# Patient Record
Sex: Female | Born: 1945 | Race: White | Hispanic: No | Smoking: Former smoker
Health system: Southern US, Community
[De-identification: ages and names within clinical notes are randomized; demographics above are authoritative.]

## PROBLEM LIST (undated history)

## (undated) DIAGNOSIS — G309 Alzheimer's disease, unspecified: Secondary | ICD-10-CM

## (undated) DIAGNOSIS — F0280 Dementia in other diseases classified elsewhere without behavioral disturbance: Secondary | ICD-10-CM

## (undated) DIAGNOSIS — E119 Type 2 diabetes mellitus without complications: Secondary | ICD-10-CM

---

## 2003-11-11 ENCOUNTER — Observation Stay (HOSPITAL_COMMUNITY): Admission: EM | Admit: 2003-11-11 | Discharge: 2003-11-14 | Payer: Self-pay | Admitting: Emergency Medicine

## 2004-01-24 ENCOUNTER — Ambulatory Visit (HOSPITAL_COMMUNITY): Admission: RE | Admit: 2004-01-24 | Discharge: 2004-01-24 | Payer: Self-pay | Admitting: Internal Medicine

## 2008-10-18 ENCOUNTER — Encounter: Payer: Self-pay | Admitting: Emergency Medicine

## 2008-10-18 ENCOUNTER — Inpatient Hospital Stay (HOSPITAL_COMMUNITY): Admission: EM | Admit: 2008-10-18 | Discharge: 2008-10-20 | Payer: Self-pay | Admitting: Cardiology

## 2008-10-19 ENCOUNTER — Encounter (INDEPENDENT_AMBULATORY_CARE_PROVIDER_SITE_OTHER): Payer: Self-pay | Admitting: Cardiology

## 2009-04-24 ENCOUNTER — Encounter: Payer: Self-pay | Admitting: Internal Medicine

## 2009-04-30 ENCOUNTER — Telehealth (INDEPENDENT_AMBULATORY_CARE_PROVIDER_SITE_OTHER): Payer: Self-pay

## 2009-05-02 ENCOUNTER — Telehealth (INDEPENDENT_AMBULATORY_CARE_PROVIDER_SITE_OTHER): Payer: Self-pay | Admitting: *Deleted

## 2009-05-02 ENCOUNTER — Encounter: Payer: Self-pay | Admitting: Gastroenterology

## 2010-03-11 ENCOUNTER — Encounter (INDEPENDENT_AMBULATORY_CARE_PROVIDER_SITE_OTHER): Payer: Self-pay

## 2010-03-11 ENCOUNTER — Ambulatory Visit: Payer: Self-pay | Admitting: Internal Medicine

## 2010-03-26 ENCOUNTER — Emergency Department (HOSPITAL_COMMUNITY): Admission: EM | Admit: 2010-03-26 | Discharge: 2010-03-26 | Payer: Self-pay | Admitting: Emergency Medicine

## 2010-03-26 DIAGNOSIS — Z8719 Personal history of other diseases of the digestive system: Secondary | ICD-10-CM

## 2010-04-28 ENCOUNTER — Ambulatory Visit (HOSPITAL_COMMUNITY): Admission: RE | Admit: 2010-04-28 | Discharge: 2010-04-28 | Payer: Self-pay | Admitting: Internal Medicine

## 2010-05-07 ENCOUNTER — Encounter: Payer: Self-pay | Admitting: Internal Medicine

## 2010-07-22 NOTE — Assessment & Plan Note (Signed)
Summary: CHANGE IN BOWEL HABITS/LAW   Visit Type:  Initial Consult Primary Care Provider:  bulter  Chief Complaint:  change in bowel habits.  History of Present Illness: 65 year old lady referred courtesy of Dr. Charm Barges to further evaluate recent left-sided abdominal pain and changes consistent with diverticulitis on CT. Ms. Westerlund tells me she started having some left-sided abdominal pain about a month ago. A CT demonstrated mild changes consistent with uncomplicated diverticulitis. Also a nonspecific adrenal nodule for which MRI was recommended. The patient has put off getting an MRI to date. Also, she was given a prescription for Cipro but she declined to take it, concerned about side effects. No history of allergies to quinolones.  Her abdominal pain has resolved. She denies any bowel dysfunction. Specifically, she denies constipation, diarrhea, hematochezia or melena. No abdominal pain now,  no fever or chills. No nausea vomiting or reflux symptoms.  History of a colonic adenoma and left sided diverticulosis on 2005 colonoscopy. It was recommended she come back in 2010 a repeat colonoscopy which she has not done as of yet.  Preventive Screening-Counseling & Management  Alcohol-Tobacco     Smoking Status: quit  Current Medications (verified): 1)  Metformin Hcl 500 Mg Tabs (Metformin Hcl) .... Two Times A Day 2)  Lisinopril 20 Mg Tabs (Lisinopril) .... Once Daily 3)  Lasix 40 Mg Tabs (Furosemide) .... Once Daily 4)  Zoloft 100 Mg Tabs (Sertraline Hcl) .... Once Daily 5)  Vitamin D 50000iu .... Twice A Week 6)  Systene Eye Gtt 7)  Vicodin 5-500 Mg Tabs (Hydrocodone-Acetaminophen) .... As Needed 8)  Isosorbide Dinitrate 30 Mg Tabs (Isosorbide Dinitrate) .... Once Daily  Allergies (verified): 1)  ! Cortisone  Past History:  Past Medical History: DM lumbar spinal stenosis abd pain hx of diverticulitis anxiety htn  Past Surgical History: hyst tcs/egd 2005  Family  History: Father: deceased- dm Mother: dm Siblings: 6 brothers, 2 sisters No FH of Colon Cancer:  Social History: Marital Status: widow Children: 3 Occupation: no Patient is a former smoker.  Alcohol Use - no Smoking Status:  quit  Vital Signs:  Patient profile:   65 year old female Height:      61 inches Weight:      224 pounds BMI:     42.48 Temp:     98.9 degrees F oral Pulse rate:   60 / minute BP sitting:   122 / 78  (left arm) Cuff size:   large  Vitals Entered By: Hendricks Limes LPN (March 11, 2010 3:01 PM)  Physical Exam  General:  pleasant 65 year old lady accompanied by her daughter-in-law Eyes:  no scleral icterus. Conjunctivae were pink. Lungs:  clear to auscultation Heart:  regular rate rhythm without murmur gallop rub  Impression & Recommendations: Impression: Pleasant 65 year old lady with recent bout of left-sided abdominal pain most likely related to a bout of uncomplicated diverticulitis. This illness has resolved without any apparent antibiotic therapy. History of colonic adenoma; she is overdue for surveillance colonoscopy.  History of a nonspecific adrenal lesion MRI recommended -  not yet done.  Recommendations: Surveillance colonoscopy in October of this year. Risks, benefits, limitations, imponderables and alternatives have been reviewed; her questions answered. She is agreeable. As a separate issue, I strongly urged her to follow the recommendations of  Dr. Charm Barges g regarding an MRI of the adrenal lesion as previously recommended.  Further recommendations to follow.  Appended Document: Orders Update    Clinical Lists Changes  Problems: Added new problem  of DIVERTICULITIS, HX OF (ICD-V12.79) Orders: Added new Service order of New Patient Level IV (52841) - Signed

## 2010-07-22 NOTE — Letter (Signed)
Summary: Patient Notice, Colon Biopsy Results  [facility]  [address]   [place], [place] [address]   Phone: [phone]  Fax: [phone]       May 07, 2010   [facility] [address] [place], [place]  [address] 1946-06-09    Dear Ms. Korb,  I am pleased to inform you that the biopsies taken during your recent colonoscopy did not show any evidence of cancer upon pathologic examination.  Additional information/recommendations:  You should have a repeat colonoscopy examination  in 5 years.  Please call us if you are having persistent problems or have questions about your condition that have not been fully answered at this time.  Sincerely,    R. Roetta Sessions MD, FACP [facility] Ph: [phone]    Fax: [phone]   Appended Document: Patient Notice, Colon Biopsy Results Letter mailed to pt.  Appended Document: Patient Notice, Colon Biopsy Results reminder in computer

## 2010-07-22 NOTE — Miscellaneous (Signed)
Summary: tcs/egd 2005  Clinical Lists Changes  NAME:  Penny Carr, Penny Carr                       ACCOUNT NO.:  [id number]   MEDICAL RECORD NO.:  [id number]                   PATIENT TYPE:  AMB   LOCATION:  DAY                                  FACILITY:  [facility]   PHYSICIAN:  R. Roetta Sessions, M.D.              DATE OF BIRTH:  12/11/1945   DATE OF PROCEDURE:  01/24/2004  DATE OF DISCHARGE:                                 OPERATIVE REPORT   PROCEDURE:  Diagnostic esophagogastroduodenoscopy by colonoscopy with  biopsy, stool sampling.   INDICATIONS FOR PROCEDURE:  Patient is a 65 year old lady with atypical  chest pain.  Cardiac evaluation came up negative.  She has also got chronic  diarrhea.  She comes for EGD and colonoscopy to further evaluate her chest  pain and to further evaluate her diarrhea and in part for colorectal cancer  screening.  This approach has been discussed with the patient at length.  Potential risks, benefits and alternatives have been reviewed, questions  answered.  Please see my January 11, 2004, consultation note for more  information.   PROCEDURE NOTE:  The O2 saturation, blood pressure, pulse, respirations were  monitored throughout the entirety of both procedures.   CONSCIOUS SEDATION:  1. Versed 4 mg IV.  2. Demerol 100 mg IV.   SBE prophylaxis in the way of ampicillin 2 gm IV, gentamicin 120 mg IV.  Prior to the procedure, Cetacaine spray for topical oropharyngeal  anesthesia.   INSTRUMENT:  Olympus video chip system.   FINDINGS ON EXAMINATION:  Tubular esophagus revealed no mucosal  abnormalities.  EG junction easily traversed.  The remaining stomach, colon  and gastric cavity was emptied and __________.  Thorough examination of  gastric mucosa including retroflexed view of the proximal stomach,  esophagogastric junction demonstrated no abnormalities.  The pylorus was  patent and easily traversed.  Examination of bulb and second portion  revealed no  abnormalities.   THERAPEUTIC/DIAGNOSTIC MANEUVERS PERFORMED:  None.   The patient tolerated the procedure well and was prepared for colonoscopy.  Digital rectal exam revealed no abnormalities.   ENDOSCOPIC FINDINGS:  The prep was adequate.   RECTUM:  Examination of rectal mucosa including retroflexed view of the anal  verge revealed no abnormalities.   COLON:  Colonic mucosa was surveyed from the rectosigmoid junction through  the left transverse, right colon to the appendiceal orifice, ileocecal valve  and cecum.  These structures were well seen and photographed for the record.  The terminal ileum was intubated as well.  From this level, the scope was  slowly withdrawn.  All previously mentioned mucosal surfaces were again  seen.  The patient was noted to have sigmoid diverticula and a 3 mm polyp in  the mid descending colon which was cold biopsied.  The terminal ileum  appeared normal.  A stool sample was taken.  Biopsies of the transverse and  sigmoid segments  were taken to rule out microscopic colitis.  The patient  tolerated the procedures well, was reactive to endoscopy.   IMPRESSION:  Esophagogastroduodenoscopy normal esophagus, stomach, D1 and  D2.   COLONOSCOPY FINDINGS:  1. Normal rectum.  2. Left-sided diverticula, diminutive polyp mid and descending colon cold     biopsied/removed.  3. Remainder of colonic mucosa appeared normal.  4. Terminal ileum appeared normal.   __________ biopsies taken, stool sample taken.   RECOMMENDATIONS:  1. Gastroesophageal reflux disease literature provided to Ms. Heuberger.  2. Followup on biopsies and the stool studies.  3. Further recommendations to follow.      ___________________________________________                                            Jonathon Bellows, M.D.   RMR/MEDQ  D:  01/24/2004  T:  01/24/2004  Job:  [id number]   cc:   [address]  [place]  [place] [address]  Fax: [phone]      NAME:  Penny Carr, Penny Carr                       ACCOUNT NO.:  [id number]   MEDICAL RECORD NO.:  [id number]                   PATIENT TYPE:  INP   LOCATION:  A224                                 FACILITY:  [facility]   PHYSICIAN:  R. Roetta Sessions, M.D.              DATE OF BIRTH:  1946/04/02   DATE OF CONSULTATION:  DATE OF DISCHARGE:  11/14/2003                                   CONSULTATION   REFERRING PHYSICIAN:  Dr. Vania Rea, Hospitalist with [facility].   PRIMARY CARE PHYSICIAN:  Dr. Samuel Jester.   REASON FOR CONSULTATION:  Noncardiac chest pain.   HISTORY OF PRESENT ILLNESS:  The patient is a 65 year old Caucasian female  who presents today at the request of Dr. Vania Rea for further  evaluation of noncardiac chest pain.  She was admitted to the hospitalist  service in May with a two day history of progressive worsening pressure  across the anterior chest and midline with difficulty swallowing and pain  radiating into her neck.  She had shortness of breath, nausea and  diaphoresis.  The pain was intermittent over a couple of days and became  constant.  She went on to the emergency department for evaluation.  She  received sublingual nitroglycerin and a GI cocktail and felt better.  She  was admitted to rule out cardiac etiology.  She had negative cardiac enzymes  times three.  She had a stress test, which was unremarkable.  Echocardiogram  revealed some mild mitral valve insufficiency and tricuspid regurgitation.  Ultimately, it was felt that her pain was noncardiac in origin.  She did  have a barium esophagram, which was normal.  The patient was recommended to  follow-up with [place] regarding further workup.  She tells me  that she was sent  home on Protonix 40 mg b.i.d., but she could not afford the medication.  She  has been taking a homemade cocktail with grape juice, apple juice and  vinegar for her reflux symptoms.  She does have typical heartburn symptoms  regularly.   She no longer has any difficulty swallowing or pain with  swallowing, as she did the day she went to the emergency department.  At  times she strangles when she swallows food or saliva, however.  She denies  any abdominal pain, nausea or vomiting.  She has had chronic loose BMs,  especially postprandially.  She says that sometimes she can have 10 to 20  stools daily, but more recently it has just been anywhere from three to  five.  She denies any melena.  She has had occasional bright red blood per  rectum noted on the toilet tissue.  She passes a lot of mucus in her stools.  She has never had any workup, but has been recommended to have a colonoscopy  on several occasions.   CURRENT MEDICATIONS:  1. Zoloft 25 mg q.d.  2. Lasix 40 mg q.d.  3. Aspirin 81 mg q.d.  4. Multivitamin q.d.  5. Garlic q.d.  6. Homemade colloidal silver.   ALLERGIES:  STEROID PACK.   PAST MEDICAL HISTORY:  1. Hypoglycemia.  2. Chronic back and hip pain.  3. She gives a history of hiatal hernia, but none seen on recent esophagram.  4. History of chronic diarrhea.  5. Depression.  6. Fluid retention.  7. Sleep apnea.  8. Hyperlipidemia.  9. Systolic heart murmur.  10.      Status post hysterectomy.   FAMILY HISTORY:  Mother had bypass surgery.  Father died of heart disease.  No family history of colorectal cancers.   SOCIAL HISTORY:  She is married for 40 years and has three children.  She is  disabled secondary to her back.  She quit smoking in the 1980s.  She takes  whiskey for colds.   REVIEW OF SYSTEMS:  Please see HPI for GI and CARDIOPULMONARY.   PHYSICAL EXAMINATION:  VITAL SIGNS:  Temperature 98; blood pressure 140/90;  pulse 80; height 5 feet 1 inch; weight 288.  GENERAL:  Pleasant, morbidly obese, Caucasian female in no acute distress.  SKIN:  Warm and dry; no jaundice.  HEENT:  Conjunctivae are pink; sclerae are nonicteric; oropharyngeal mucosa  moist and pink, no lesions, erythema or  exudate; no lymphadenopathy,  thyromegaly.  CHEST:  Lungs clear to auscultation.  CARDIAC:  Regular rate and rhythm; 2/6 systolic ejection murmur heard best  in the upper precordium.  ABDOMEN:  Massively obese; positive bowel sounds; soft; she has mild  epigastric tenderness to deep palpation; no organomegaly or masses  appreciated, but limited due to body habitus.  EXTREMITIES:  No edema.   LABORATORIES:  From Nov 14, 2003 - CBC normal, except mildly low hematocrit  at 35.6, LFTs normal.  Barium pill esophagram revealed normal study with 13  mm barium tablet passing readily into the stomach.   IMPRESSION:  The patient is a 65 year old lady recently admitted to the  hospital with chest pain.  She ruled out for myocardial infarction.  It was  felt that her pain was noncardiac in origin.  Etiology of gastroesophageal  reflux disease has been entertained.  She does have typical reflux symptoms  and currently is self-medicating with a homemade remedy.  She is still  having some symptoms,  although no frank chest pain.  She denies any further  dysphagia.  Given that she required hospitalization for atypical chest pain,  I feel that it is very reasonable at this time to go ahead and evaluate her  upper gastrointestinal tract with endoscopy.  In addition, she has a history  of chronic diarrhea, which occurs primarily postprandially.  This may be due  to irritable bowel syndrome.  However, she has really had no previous workup  or treatment.  In addition, she has small volume hematochezia occasionally.  She is well overdue for a colonoscopy and we will proceed with a colonoscopy  for both screening and diagnostic purposes.   PLAN:  1. EGD and colonoscopy in the near future.  I discussed risks, alternatives     and benefits with the patient in regards to bleeding, infection and    perforations and she is agreeable to proceed.  2. She will receive SVE prophylaxis given heart murmur.  3. We  will make recommendations regarding any reflux treatment after     endoscopy.     ____________________  R. Roetta Sessions, M.D.     ____________________  Tana Coast, P.A.     ________________________________________  ___________________________________________  Tana Coast, P.A.                         Jonathon Bellows, M.D.   LL/MEDQ  D:  01/11/2004  T:  01/11/2004  Job:  [id number]   cc:   R. Roetta Sessions, M.D.  [address]  [place]  [place] [address]  Fax: [phone]   [address]  [place]  [place] [address]  Fax: [phone]   Vania Rea, M.D.

## 2010-07-22 NOTE — Letter (Signed)
Summary: TCS ORDER  TCS ORDER   Imported By: Ave Filter 03/11/2010 15:40:48  _____________________________________________________________________  External Attachment:    Type:   Image     Comment:   External Document  Appended Document: TCS ORDER Pt called and spoke with Penny Carr to cx her procedure.Marland KitchenMarland KitchenI called pt to reschedule her procedure,no answer,lmom.   Appended Document: TCS ORDER Pt rescheduled to 04/28/10@9 :45  Appended Document: TCS ORDER pt called, procedure on Monday 04/28/2010. current meds are the same as in emr, no change in health or any new problems.

## 2010-08-22 ENCOUNTER — Emergency Department (HOSPITAL_BASED_OUTPATIENT_CLINIC_OR_DEPARTMENT_OTHER)
Admission: EM | Admit: 2010-08-22 | Discharge: 2010-08-22 | Disposition: A | Discharge status: Home / Self Care | Payer: MEDICARE | Attending: Emergency Medicine | Admitting: Emergency Medicine

## 2010-08-22 DIAGNOSIS — I10 Essential (primary) hypertension: Secondary | ICD-10-CM | POA: Insufficient documentation

## 2010-08-22 DIAGNOSIS — E119 Type 2 diabetes mellitus without complications: Secondary | ICD-10-CM | POA: Insufficient documentation

## 2010-08-22 DIAGNOSIS — Z79899 Other long term (current) drug therapy: Secondary | ICD-10-CM | POA: Insufficient documentation

## 2010-08-22 DIAGNOSIS — M545 Low back pain, unspecified: Secondary | ICD-10-CM | POA: Insufficient documentation

## 2010-08-22 LAB — URINALYSIS, ROUTINE W REFLEX MICROSCOPIC
Bilirubin Urine: NEGATIVE
Glucose, UA: NEGATIVE mg/dL
Hgb urine dipstick: NEGATIVE
Ketones, ur: NEGATIVE mg/dL
Nitrite: NEGATIVE
Specific Gravity, Urine: 1.012 (ref 1.005–1.030)
Urobilinogen, UA: 0.2 mg/dL (ref 0.0–1.0)
pH: 5.5 (ref 5.0–8.0)

## 2010-09-02 LAB — GLUCOSE, CAPILLARY: Glucose-Capillary: 99 mg/dL (ref 70–99)

## 2010-09-04 LAB — POCT CARDIAC MARKERS
CKMB, poc: <1 ng/mL — ABNORMAL LOW (ref 1.0–8.0)
CKMB, poc: <1 ng/mL — ABNORMAL LOW (ref 1.0–8.0)
Myoglobin, poc: 32 ng/mL (ref 12–200)
Myoglobin, poc: 35.5 ng/mL (ref 12–200)
Troponin i, poc: <0.05 ng/mL (ref 0.00–0.09)
Troponin i, poc: <0.05 ng/mL (ref 0.00–0.09)

## 2010-09-04 LAB — PROTIME-INR
INR: 0.9 (ref 0.00–1.49)
Prothrombin Time: 12.4 seconds (ref 11.6–15.2)

## 2010-09-04 LAB — CBC
HCT: 40.5 % (ref 36.0–46.0)
Hemoglobin: 12.8 g/dL (ref 12.0–15.0)
MCH: 30.5 pg (ref 26.0–34.0)
MCHC: 31.6 g/dL (ref 30.0–36.0)
MCV: 96.7 fL (ref 78.0–100.0)
Platelets: 234 10*3/uL (ref 150–400)
RBC: 4.19 MIL/uL (ref 3.87–5.11)
RDW: 14.2 % (ref 11.5–15.5)
WBC: 8.2 10*3/uL (ref 4.0–10.5)

## 2010-09-04 LAB — BASIC METABOLIC PANEL
BUN: 16 mg/dL (ref 6–23)
CO2: 28 mEq/L (ref 19–32)
Calcium: 9.6 mg/dL (ref 8.4–10.5)
Chloride: 110 mEq/L (ref 96–112)
Creatinine, Ser: 0.6 mg/dL (ref 0.4–1.2)
GFR calc Af Amer: >60 mL/min (ref >60)
GFR calc non Af Amer: >60 mL/min (ref >60)
Glucose, Bld: 119 mg/dL — ABNORMAL HIGH (ref 70–99)
Potassium: 4 mEq/L (ref 3.5–5.1)
Sodium: 144 mEq/L (ref 135–145)

## 2010-09-04 LAB — DIFFERENTIAL
Basophils Absolute: 0 10*3/uL (ref 0.0–0.1)
Basophils Relative: 1 % (ref 0–1)
Eosinophils Absolute: 0.1 10*3/uL (ref 0.0–0.7)
Eosinophils Relative: 1 % (ref 0–5)
Lymphocytes Relative: 38 % (ref 12–46)
Lymphs Abs: 3.2 10*3/uL (ref 0.7–4.0)
Monocytes Absolute: 0.4 10*3/uL (ref 0.1–1.0)
Monocytes Relative: 5 % (ref 3–12)
Neutro Abs: 4.5 10*3/uL (ref 1.7–7.7)
Neutrophils Relative %: 55 % (ref 43–77)

## 2010-09-04 LAB — URINALYSIS, ROUTINE W REFLEX MICROSCOPIC
Bilirubin Urine: NEGATIVE
Glucose, UA: NEGATIVE mg/dL
Hgb urine dipstick: NEGATIVE
Ketones, ur: NEGATIVE mg/dL
Nitrite: NEGATIVE
Protein, ur: NEGATIVE mg/dL
Specific Gravity, Urine: 1.027 (ref 1.005–1.030)
Urobilinogen, UA: 0.2 mg/dL (ref 0.0–1.0)
pH: 6 (ref 5.0–8.0)

## 2010-09-30 LAB — BASIC METABOLIC PANEL
BUN: 14 mg/dL (ref 6–23)
CO2: 29 mEq/L (ref 19–32)
Calcium: 9.3 mg/dL (ref 8.4–10.5)
Chloride: 108 mEq/L (ref 96–112)
Creatinine, Ser: 0.53 mg/dL (ref 0.4–1.2)
GFR calc Af Amer: >60 mL/min (ref >60)
GFR calc non Af Amer: >60 mL/min (ref >60)
Glucose, Bld: 123 mg/dL — ABNORMAL HIGH (ref 70–99)
Potassium: 4 mEq/L (ref 3.5–5.1)
Sodium: 141 mEq/L (ref 135–145)

## 2010-09-30 LAB — CBC
HCT: 32.8 % — ABNORMAL LOW (ref 36.0–46.0)
Hemoglobin: 11.3 g/dL — ABNORMAL LOW (ref 12.0–15.0)
MCHC: 34.3 g/dL (ref 30.0–36.0)
MCV: 91.1 fL (ref 78.0–100.0)
Platelets: 186 10*3/uL (ref 150–400)
RBC: 3.61 MIL/uL — ABNORMAL LOW (ref 3.87–5.11)
RDW: 14.6 % (ref 11.5–15.5)
WBC: 7.1 10*3/uL (ref 4.0–10.5)

## 2010-09-30 LAB — GLUCOSE, CAPILLARY: Glucose-Capillary: 131 mg/dL — ABNORMAL HIGH (ref 70–99)

## 2010-10-01 LAB — CBC
HCT: 37.4 % (ref 36.0–46.0)
HCT: 35.9 % — ABNORMAL LOW (ref 36.0–46.0)
Hemoglobin: 12.8 g/dL (ref 12.0–15.0)
MCHC: 34.3 g/dL (ref 30.0–36.0)
MCV: 90.5 fL (ref 78.0–100.0)
Platelets: 238 10*3/uL (ref 150–400)
Platelets: 217 10*3/uL (ref 150–400)
RBC: 4.14 MIL/uL (ref 3.87–5.11)
RDW: 14.6 % (ref 11.5–15.5)
RDW: 14.6 % (ref 11.5–15.5)
WBC: 8 10*3/uL (ref 4.0–10.5)

## 2010-10-01 LAB — BASIC METABOLIC PANEL
BUN: 14 mg/dL (ref 6–23)
BUN: 12 mg/dL (ref 6–23)
CO2: 32 mEq/L (ref 19–32)
Calcium: 9.7 mg/dL (ref 8.4–10.5)
Chloride: 99 mEq/L (ref 96–112)
Chloride: 106 mEq/L (ref 96–112)
Creatinine, Ser: 0.62 mg/dL (ref 0.4–1.2)
GFR calc Af Amer: >60 mL/min (ref >60)
GFR calc Af Amer: >60 mL/min (ref >60)
GFR calc non Af Amer: >60 mL/min (ref >60)
GFR calc non Af Amer: >60 mL/min (ref >60)
Glucose, Bld: 109 mg/dL — ABNORMAL HIGH (ref 70–99)
Potassium: 3.7 mEq/L (ref 3.5–5.1)
Potassium: 3.7 mEq/L (ref 3.5–5.1)
Sodium: 139 mEq/L (ref 135–145)
Sodium: 143 mEq/L (ref 135–145)

## 2010-10-01 LAB — DIFFERENTIAL
Basophils Absolute: 0.1 10*3/uL (ref 0.0–0.1)
Basophils Relative: 1 % (ref 0–1)
Eosinophils Absolute: 0.1 10*3/uL (ref 0.0–0.7)
Eosinophils Relative: 1 % (ref 0–5)
Lymphocytes Relative: 38 % (ref 12–46)
Lymphs Abs: 3 10*3/uL (ref 0.7–4.0)
Monocytes Absolute: 0.6 10*3/uL (ref 0.1–1.0)
Monocytes Relative: 7 % (ref 3–12)
Neutro Abs: 4.2 10*3/uL (ref 1.7–7.7)
Neutrophils Relative %: 53 % (ref 43–77)

## 2010-10-01 LAB — BRAIN NATRIURETIC PEPTIDE: Pro B Natriuretic peptide (BNP): <30 pg/mL (ref 0.0–100.0)

## 2010-10-01 LAB — POCT CARDIAC MARKERS
CKMB, poc: <1 ng/mL — ABNORMAL LOW (ref 1.0–8.0)
Myoglobin, poc: 89.4 ng/mL (ref 12–200)
Troponin i, poc: <0.05 ng/mL (ref 0.00–0.09)

## 2010-10-01 LAB — LIPID PANEL
Cholesterol: 148 mg/dL (ref 0–200)
Triglycerides: 106 mg/dL (ref <150)

## 2010-10-01 LAB — CARDIAC PANEL(CRET KIN+CKTOT+MB+TROPI)
CK, MB: 0.8 ng/mL (ref 0.3–4.0)
CK, MB: 1.1 ng/mL (ref 0.3–4.0)
Relative Index: INVALID (ref 0.0–2.5)
Total CK: 40 U/L (ref 7–177)
Troponin I: 0.01 ng/mL (ref 0.00–0.06)

## 2010-10-01 LAB — GLUCOSE, CAPILLARY
Glucose-Capillary: 104 mg/dL — ABNORMAL HIGH (ref 70–99)
Glucose-Capillary: 113 mg/dL — ABNORMAL HIGH (ref 70–99)
Glucose-Capillary: 95 mg/dL (ref 70–99)

## 2010-10-01 LAB — HEMOGLOBIN A1C: Hgb A1c MFr Bld: 5.9 % (ref 4.6–6.1)

## 2010-10-01 LAB — PROTIME-INR
INR: 1 (ref 0.00–1.49)
Prothrombin Time: 13.4 seconds (ref 11.6–15.2)

## 2010-10-01 LAB — APTT: aPTT: 31 seconds (ref 24–37)

## 2010-11-04 NOTE — Discharge Summary (Signed)
Penny Carr, Carr             ACCOUNT NO.:  [id number]   MEDICAL RECORD NO.:  [id number]          PATIENT TYPE:  INP   LOCATION:  3734                         FACILITY:  [facility]   PHYSICIAN:  Jake Bathe, MD      DATE OF BIRTH:  02-06-1946   DATE OF ADMISSION:  10/18/2008  DATE OF DISCHARGE:  10/20/2008                               DISCHARGE SUMMARY   DISCHARGE DIAGNOSES:  1. Chest pain - cardiac catheterization was performed.  See below.  2. Diabetes.  3. Obesity.  4. Hyperlipidemia.  5. Hypertension.  6. Bradycardia.  7. Depression.   PROCEDURES:  1. Cardiac catheterization on October 19, 2008, showed normal ejection      fraction of 65% with no wall motion abnormalities.  She did have in      her LAD a prompt taper in the midportion of the vessel, which may      represent an old occluded diagonal branch, but most likely      represents a sharp bend in the LAD or perhaps mild aneurysmal      segment of the artery.  After this segment, the LAD tapers to a      smaller caliber vessel, which did respond to nitroglycerin during      catheterization.  Otherwise, there was no angiographically      significant coronary artery disease present.  She did have moderate      mitral annular calcification noted.  2. Echocardiogram demonstrated mild LVH, ejection fraction 65-70%,      grade 1 diastolic dysfunction, and mitral annular calcification.   BRIEF HOSPITAL COURSE:  She was admitted as a transfer from [facility] secondary to progressively worse chest pain, shortness of  breath that she has been experiencing even when walking to her mailbox.  Her ECG has been unremarkable except for sinus bradycardia, which was  exacerbated by a dose of metoprolol 25 mg, which was given the day  before the catheterization.  On the second day of hospitalization and on  the day of catheterization in the morning, her heart rate was 42 beats  per minute.  After metoprolol had worn off, her  heart rate once again  was in the upper 50s.  Cardiac catheterization was performed as above.  No intervention or bypass surgery was needed.  She was then started on  isosorbide mononitrate 30 mg once a day in addition to other  medications.  On the day of discharge, she was ambulating well, eating  well, no chest pain, no shortness of breath.   DISCHARGE MEDICATIONS:  The only change made was Imdur 30 mg once a day.  She is to take her sertraline, lisinopril, metformin, aspirin,  furosemide, simvastatin as before.   ALLERGIES:  No known drug allergies.  She is not to take metoprolol  given her bradycardia.   FOLLOWUP:  She has followup scheduled with me in 2 weeks.  She knows not  to lift any heavy objects and no driving for the next 3-4 days.  She  knows to contact me immediately if any fever occurs or  any changes in  her legs, which are worrisome.   In regards to her shortness of breath certainly diastolic dysfunction  may be playing a role as well as possible coronary vasospasm as was seen  during catheterization in her mid to distal LAD segment, this is why he  Imdur was added.  Continue with aggressive risk factor modification  especially obesity and diabetes.   LABORATORY DATA:  Cardiac biomarkers were all negative.  Her BMET on  discharge demonstrated a creatinine of 0.5, otherwise unremarkable.  Glucose was 123.  White count 7.1, hemoglobin 11.3, hematocrit 32.8,  platelets 186.  Total cholesterol 148, triglycerides 106, HDL 36, LDL  91.  Hemoglobin A1c was 5.9.  TSH was normal at 1.5.  BNP was less than  30.   DISCHARGE TIME:  Thirty five minutes.      Jake Bathe, MD  Electronically Signed     MCS/MEDQ  D:  10/20/2008  T:  10/20/2008  Job:  [id number]   cc:   Samuel Jester

## 2010-11-04 NOTE — Cardiovascular Report (Signed)
NAMELYGIA, OLAES             ACCOUNT NO.:  [id number]   MEDICAL RECORD NO.:  [id number]          PATIENT TYPE:  INP   LOCATION:  3734                         FACILITY:  [facility]   PHYSICIAN:  Jake Bathe, MD      DATE OF BIRTH:  Feb 20, 1946   DATE OF PROCEDURE:  10/19/2008  DATE OF DISCHARGE:                            CARDIAC CATHETERIZATION   PROCEDURE:  1. Left heart catheterization.  2. Selective coronary angiography.  3. Left ventriculogram.   INDICATIONS:  A 65 year old female with obesity, diabetes, hypertension,  hyperlipidemia with unstable angina.  She has been experiencing  increasing shortness of breath over the past several weeks as well as  chest pressure.   PROCEDURE DETAILS:  Informed consent was obtained.  Risk of stroke,  heart attack, death, renal impairment, and arteriovenous damage were  explained to the patient at length and her family.  She was placed on  the catheterization table and prepped in a sterile fashion.  Visualization of the femoral head was obtained via fluoroscopy.  Lidocaine 1% was used for local anesthesia.  Using the modified  Seldinger technique, a 5-French sheath was placed into the right femoral  vein.  Judkins left #4 catheter was then used to selectively cannulate  the left main artery and a Judkins right #4 catheter was used to  selectively cannulate the right coronary artery.  Multiple views with  hand injection of Omnipaque were obtained.  An angled pigtail catheter  was used to cross the aortic valve in the left ventricle.  Hemodynamics  were obtained and a left ventriculogram in the RAO position utilizing 25  mL of contrast was obtained.  Pullback was then observed.  Following the  procedure, the catheter was removed.  She received a dose of Lovenox  last night.  She was hemodynamically stable.  At the beginning of the  procedure, her blood pressure was noted to be in the 90s to low 100  systolic, therefore, 250 mL bolus of  normal saline was administered.  Also mid procedure a 100 mcg intracoronary nitroglycerin bolus was  administered down the left anterior descending artery.   FINDINGS:  1. Left main artery - branches into the left anterior descending      artery and the left circumflex artery.  There is no      angiographically significant coronary artery disease.  2. Left anterior descending artery - in the mid segment there appears      to be a perhaps an old occluded diagonal branch, however, there are      no collateralization visualized from either the right or the left      system.  This appears as a small bulge in the mid LAD segment.  The      LAD then continues smaller in diameter to the apex.  Intracoronary      nitroglycerin was administered and did improve the vessel diameter      of the LAD.  There is a sharp bend in the LAD mid segment which has      approximately 50% stenosis.  This did improve with nitroglycerin.  3. Left circumflex artery - this is a large caliber vessel giving rise      to one large obtuse marginal branch and two smaller obtuse marginal      branches.  There are minor irregularities throughout.  4. Right coronary artery - this vessel is the dominant vessel giving      rise to the posterior descending artery.  There is no      angiographically significant coronary artery disease present.  5. Left ventriculogram - normal left ventricular ejection fraction of      65% with no wall motion abnormalities and no mitral regurgitation.      Moderate mitral annular calcification was noted.   HEMODYNAMICS:  Left ventricular pressure 114 with an end-diastolic  pressure of 16 mmHg.  Aortic pressure was 114/51 with a mean of 74 mmHg.  No aortic valve gradient.   IMPRESSION:  1. 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  2. Minor irregularities throughout other coronary arteries.  3. Normal  left ventricular ejection fraction of 65% with no wall      motion abnormalities.  4. Mitral annular calcification.   PLAN:  The patient responded to intracoronary nitroglycerin with her mid  to distal LAD segment improving in diameter.  I will place her on low-  dose Imdur 30 mg once a day.  We will continue to modify risk factors.  Also noted throughout the case was her marked sinus bradycardia with a  heart rate of 42.  This was also observed earlier this morning on  echocardiogram.  I will ensure that she did not receive metoprolol  either this morning or last night.  Certainly, if her bradycardia is  pronounced this may be the cause for her symptoms.  She may benefit from  a heart monitor as an outpatient.  Findings were discussed with both  family and the patient.      Jake Bathe, MD  Electronically Signed     MCS/MEDQ  D:  10/19/2008  T:  10/20/2008  Job:  [id number]   cc:   Samuel Jester

## 2010-11-04 NOTE — H&P (Signed)
NAMEEUFELIA, Penny Carr             ACCOUNT NO.:  [id number]   MEDICAL RECORD NO.:  [id number]          PATIENT TYPE:  INP   LOCATION:  3734                         FACILITY:  [facility]   PHYSICIAN:  Jake Bathe, MD      DATE OF BIRTH:  07/14/1945   DATE OF ADMISSION:  10/18/2008  DATE OF DISCHARGE:                              HISTORY & PHYSICAL   PRIMARY CARE PHYSICIAN:  [place]. [place]   CHIEF COMPLAINT:  Chest heaviness, transfer from [facility],   HISTORY OF PRESENT ILLNESS:  A 65 year old female with diabetes,  hypertension, hyperlipidemia, and obesity, who quit smoking in 1974 and  has a family history of stroke and myocardial infarction with her sister  having an MI, who has been experiencing chest pain off and on for the  past 2-3 months with increasing frequency and intensity, rated at  approximately 5/10, substernal without any radiation.  She has been  complaining of shortness of breath associated with chest pain.  She had  to stop before reaching her mailbox which is approximately one-tenth of  a mile secondary to this discomfort and shortness of breath.  She  complains of mild diaphoresis or cold chills concomitant with this pain.  She has had no prior history of cardiovascular disease, although her  diabetes has been difficult for her to control.  She stopped taking  aspirin about 3 months ago after she noticed her finger bled easier when  pricked.  Currently, she is chest pain free, comfortable in a bed,  surrounded by her grandchildren.   PAST MEDICAL HISTORY:  As above including depression.   ALLERGIES:  No known drug allergies.   PAST SURGICAL HISTORY:  Hysterectomy at age 85.   FAMILY HISTORY:  As above.  Sister with myocardial infarction.   SOCIAL HISTORY:  She had prior tobacco use, but no alcohol.  Her husband  has had a cardiac catheterization, married.   REVIEW OF SYSTEMS:  Occasional chills, obesity.  Unless  specified above,  all other 12 review of systems negative.   PHYSICAL EXAMINATION:  VITAL SIGNS:  Blood pressure 154/78, temperature  98.8, respirations 20, and heart rate 68-52.  GENERAL:  Alert and oriented x3, in no acute distress, resting  comfortably in bed.  EYES:  Well-perfused conjunctivae.  EOMI.  No scleral icterus.  NECK:  Supple, no lymphadenopathy, thick.  No carotid bruits.  No JVD.  Cervical spine full range motion.  CARDIOVASCULAR:  Regular rate and rhythm without any appreciable  murmurs, rubs, or gallops.  Normal PMI.  LUNGS:  Clear to auscultation bilaterally.  Normal respiratory effort.  No wheezing.  ABDOMEN:  Soft, nontender, normoactive bowel sounds, obese.  No bruits.  EXTREMITIES:  Chronic lower extremity edema noted bilaterally, 2+  femoral pulses, 2+ distal pulses.  NEUROLOGIC:  Nonfocal.  No tremors noted.  Affect normal.   EKG shows sinus rhythm without any other abnormalities.  No Q-waves.  No  acute ST-T wave changes.  Chest x-ray shows no acute cardiopulmonary  abnormality.  This was personally reviewed.  Prior nuclear stress  test  in May 2005 was read as probably negative.  There was noted breast  attenuation.  She does not have a previous echocardiogram.   LABORATORY DATA:  BNP less than 30.  Point-of-care cardiac markers are  normal.  Sodium 139, potassium 3.7, BUN 14, creatinine 0.6, and glucose  109.  White count 8, hemoglobin 12.8, hematocrit 37.4, and platelet  count 238.   ASSESSMENT/PLAN:  A 65 year old female with diabetes, hypertension,  hyperlipidemia, and obesity with worsening chest pain concerning for  unstable angina.  1. Unstable angina - we will go ahead and treat with Lovenox, placed      on beta-blocker as heart rate will tolerate, simvastatin 20 mg p.o.      nightly.  We will go ahead and continue to cycle cardiac      biomarkers.  First set is unremarkable.  ECG currently      unremarkable.  Currently chest pain free.  We will  give      nitroglycerin p.r.n..  Continue oxygen.  I will make n.p.o. tonight      for a cardiac catheterization in a.m.  Risks and benefits of the      procedure have been explained to the patient at length.  2. Diabetes mellitus - check hemoglobin A1c, place on insulin sliding      scale.  We will hold metformin.  3. Obesity - encourage diet.  4. Hyperlipidemia - we will check fasting lipid profile in morning,      statin as above..  5. Hypertension - we will place on lisinopril 20 mg once a day.      Continue to monitor.      Jake Bathe, MD  Electronically Signed     MCS/MEDQ  D:  10/18/2008  T:  10/19/2008  Job:  [id number]   cc:   Samuel Jester

## 2010-11-07 NOTE — H&P (Signed)
NAME:  Penny Carr, Penny Carr                       ACCOUNT NO.:  [id number]   MEDICAL RECORD NO.:  [id number]                   PATIENT TYPE:  INP   LOCATION:  A224                                 FACILITY:  [facility]   PHYSICIAN:  Tesfaye D. Felecia Shelling, M.D.              DATE OF BIRTH:  Mar 10, 1946   DATE OF ADMISSION:  11/11/2003  DATE OF DISCHARGE:                                HISTORY & PHYSICAL   CHIEF COMPLAINT:  Chest pain.   HISTORY OF PRESENT ILLNESS:  This is a 65 year old white female who came to  the emergency room with the above complaint.  The patient started having  chest pain about three days back.  The pain is mainly on mediastinal area.  It radiates to other parts of the chest and to her neck.  The patient also  has pain on swallowing.  She received about three nitroglycerin here in the  emergency room and later the patient felt better.  The patient had no  nausea, vomiting or diaphoresis.  The patient has no history of coronary  artery disease in the past.  However, she has a strong family history of  coronary artery disease.  Her cholesterol was also found to be slightly high  last year.  Her initial EKG and cardiac enzymes was within normal limits.  The patient is being admitted to rule out unstable angina.   REVIEW OF SYSTEMS:  The patient has no fever, headache, cough, nausea,  vomiting, abdominal pain, dysuria, urgency or frequency of urination.   PAST MEDICAL HISTORY:  1. History of hypoglycemia.  2. Mild hypertension.  3. Hyperlipidemia.  4. Sleep apnea syndrome.  5. Depression disorder.  6. Back pain.   CURRENT MEDICATIONS:  1. Zoloft 25 mg p.o. daily.  2. Mobic 50 mg p.o. daily.   SOCIAL HISTORY:  The patient is married.  She is currently disabled.  She  stopped smoking in 1998.  No history of alcohol or substance abuse.   FAMILY HISTORY:  Both her mother and father has a history of coronary artery  disease, and her mother had coronary artery bypass surgery.   Her father is  also a diabetic.   PHYSICAL EXAMINATION:  GENERAL:  The patient is alert, awake, and  comfortable, in no form of distress.  VITAL SIGNS:  Blood pressure 144/69, pulse 65, respiratory rate 20,  temperature 98.3 degrees Fahrenheit.  HEENT:  Pupils are equal, reactive.  NECK:  Supple.  CHEST:  Decreased air entry, a few rhonchi.  CARDIOVASCULAR SYSTEMS:  First and second heart sounds are heard.  No  murmur, no gallop.  ABDOMEN:  Obese, soft, and relaxed.  Bowel sounds are positive, no mass, no  organomegaly.  EXTREMITIES:  No leg edema.   LABORATORY DATA:  On admission, CBC:  WBC 10.0, hemoglobin 12.9, hematocrit  39.9.  Platelets 288,000.  Sodium 137, potassium 3.8, chloride 102, carbon  dioxide 31, glucose 91, BUN  12, creatinine 0.7, and calcium 9.2.  Troponin  less than 0.01.   ASSESSMENT:  1. This is a 65 year old female patient with a history of mild     hyperlipidemia and a strong family history of coronary artery disease who     presented with substernal chest pain.  Her pain is radiating to the neck.     The patient also gives a history of pain on swallowing which has been     going on for a long time.  Her chest pain was relieved with     nitroglycerin.  Her chest pain probably may be secondary to     gastroesophageal reflux disease.  However, considering her risk factors,     would go ahead and rule out unstable angina.  2. History of sleep apnea syndrome.  3. History of mild hypertension.  4. Hyperlipidemia.   PLAN:  1. Admit the patient under telemetry.  2. Continue serial EKG's and cardiac enzymes.  3. Do cardiology consultation.  4. Will start her on Prevacid.  5. We will do a barium swallow to evaluate her dysphagia.  6. Esophagitis.     ___________________________________________                                         Eustaquio Maize. Felecia Shelling, M.D.   TDF/MEDQ  D:  11/11/2003  T:  11/11/2003  Job:  [id number]

## 2010-11-07 NOTE — Consult Note (Signed)
NAME:  Penny Carr, Penny Carr                       ACCOUNT NO.:  [id number]   MEDICAL RECORD NO.:  [id number]                   PATIENT TYPE:  INP   LOCATION:  A224                                 FACILITY:  [facility]   PHYSICIAN:  Vida Roller, M.D.                DATE OF BIRTH:  10-Sep-1945   DATE OF CONSULTATION:  11/12/2003  DATE OF DISCHARGE:                                   CONSULTATION   PRIMARY:  Dr. Samuel Jester in [place], [place].   She has not seen a cardiologist in the past.   HISTORY OF PRESENT ILLNESS:  Ms. Walsworth is a 65 year old female with a past  medical history significant for hypertension, hyperlipidemia and depression,  who presented to the ER complaining of chest pain.  She reports two days of  progressive worsening pressure across the anterior chest and midline, some  discomfort with swallowing.  This does radiate to her neck and is associated  with shortness of breath, nausea and diaphoresis.  The pressure across the  chest was relieved by sublingual nitroglycerin and she also had some  improvement with a GI cocktail.   PAST MEDICAL HISTORY:  1. Hypertension.  2. Hyperlipidemia.  3. Obstructive sleep apnea, though she is not on CPAP for that.  4. Hypoglycemia.  5. Depression.  6. Back pain.  7. She also has a hiatal hernia by history.  8. No cardiac history; no previous cardiac workup.  9. She has had a hysterectomy.  10.      She is moderately to severely obese.   She lives in [place] with her husband.  She is disabled.  She is married.  She has three sons.  She quit smoking back in her 38s and really does not  smoke at all.  No alcohol; no drugs.   Her father passed away of coronary disease at the age of 8.  He also had a  glioblastoma.  Her mother is alive at age 36, but had a bypass surgery in  her late 83s.  She has six brothers and two sisters, one of whom had a heart  attack when she was 50.   REVIEW OF SYSTEMS:  Generally  negative, except for that reviewed in the  history of present illness.   MEDICATIONS:  1. Aspirin 325 once a day.  2. MOBIC 15 mg once a day.  3. Protonix 40 mg once a day.  4. Zoloft 25 mg once a day.  5. Nitroglycerin on a p.r.n. basis.   PHYSICAL EXAMINATION:  VITAL SIGNS:  She weighs 290 pounds; pulse is 58;  respiratory rate is 20; she is afebrile; her blood pressure is 141/71.  GENERAL:  She is in no apparent distress.  She is completely pain free.  HEAD, EARS, EYES, NOSE AND THROAT:  Unremarkable.  NECK:  Supple.  She has bilateral referred murmurs up into her neck, but no  obvious bruits.  She has no jugulovenous distension.  CHEST:  Decreased breath sounds throughout, but no obvious wheezes or rales.  CARDIOVASCULAR:  Regular rate and rhythm with a 2/6 systolic murmur that is  not easy to hear due to her body habitus.  ABDOMEN:  Soft, nontender, normoactive bowel sounds, but obese.  LOWER EXTREMITIES:  Without significant clubbing, cyanosis or edema.  Her  pulses are 1 to 2+.  MUSCULOSKELETAL:  Nonfocal.  NEUROLOGIC:  Nonfocal.   Chest x-ray shows mild cardiomegaly without an acute pulmonary process.  Electrocardiogram shows sinus bradycardia at a rate of 46 with normal  intervals, normal axes, no ST-T wave changes concerning for ischemia, no Q  waves concerning for an old myocardial infarction.   LABORATORIES:  White blood cell count 10.0, H&H of 13 and 38, platelet count  of 286.  Sodium 137, potassium 3.8, chloride 102, bicarb 31, BUN 12,  creatinine 0.7 and her blood sugar is 91.  Two sets of cardiac enzymes are  not consistent with acute myocardial infarction.   So, we have a woman with chest discomfort, which is very atypical.  It  certainly could be odynophagia, although with the relief with the sublingual  nitroglycerin, we are going to rule out cardiac causes first.  She also has  a systolic murmur, which will need evaluated, and her carotids will need to  be  evaluated as well.  She does have hyperlipidemia and we will check a  fasting lipid panel and her hypertension appears to be reasonably well  controlled, currently, on minimal medications.  She may benefit from a low  dose of ACE inhibitor potentially for her metabolic syndrome.  We will have  further recommendations as the diagnostic evaluation progresses.      ___________________________________________                                            Vida Roller, M.D.   JH/MEDQ  D:  11/12/2003  T:  11/13/2003  Job:  [id number]

## 2010-11-07 NOTE — Consult Note (Signed)
NAME:  Penny Carr, Penny Carr                       ACCOUNT NO.:  [id number]   MEDICAL RECORD NO.:  [id number]                   PATIENT TYPE:  INP   LOCATION:  A224                                 FACILITY:  [facility]   PHYSICIAN:  R. Roetta Sessions, M.D.              DATE OF BIRTH:  06-Apr-1946   DATE OF CONSULTATION:  DATE OF DISCHARGE:  11/14/2003                                   CONSULTATION   REFERRING PHYSICIAN:  Dr. Vania Rea, Hospitalist with [facility].   PRIMARY CARE PHYSICIAN:  Dr. Samuel Jester.   REASON FOR CONSULTATION:  Noncardiac chest pain.   HISTORY OF PRESENT ILLNESS:  The patient is a 65 year old Caucasian female  who presents today at the request of Dr. Vania Rea for further  evaluation of noncardiac chest pain.  She was admitted to the hospitalist  service in May with a two day history of progressive worsening pressure  across the anterior chest and midline with difficulty swallowing and pain  radiating into her neck.  She had shortness of breath, nausea and  diaphoresis.  The pain was intermittent over a couple of days and became  constant.  She went on to the emergency department for evaluation.  She  received sublingual nitroglycerin and a GI cocktail and felt better.  She  was admitted to rule out cardiac etiology.  She had negative cardiac enzymes  times three.  She had a stress test, which was unremarkable.  Echocardiogram  revealed some mild mitral valve insufficiency and tricuspid regurgitation.  Ultimately, it was felt that her pain was noncardiac in origin.  She did  have a barium esophagram, which was normal.  The patient was recommended to  follow-up with [place] regarding further workup.  She tells me that she was sent  home on Protonix 40 mg b.i.d., but she could not afford the medication.  She  has been taking a homemade cocktail with grape juice, apple juice and  vinegar for her reflux symptoms.  She does have typical heartburn  symptoms  regularly.  She no longer has any difficulty swallowing or pain with  swallowing, as she did the day she went to the emergency department.  At  times she strangles when she swallows food or saliva, however.  She denies  any abdominal pain, nausea or vomiting.  She has had chronic loose BMs,  especially postprandially.  She says that sometimes she can have 10 to 20  stools daily, but more recently it has just been anywhere from three to  five.  She denies any melena.  She has had occasional bright red blood per  rectum noted on the toilet tissue.  She passes a lot of mucus in her stools.  She has never had any workup, but has been recommended to have a colonoscopy  on several occasions.   CURRENT MEDICATIONS:  1. Zoloft 25 mg q.d.  2. Lasix 40 mg q.d.  3. Aspirin 81 mg q.d.  4. Multivitamin q.d.  5. Garlic q.d.  6. Homemade colloidal silver.   ALLERGIES:  STEROID PACK.   PAST MEDICAL HISTORY:  1. Hypoglycemia.  2. Chronic back and hip pain.  3. She gives a history of hiatal hernia, but none seen on recent esophagram.  4. History of chronic diarrhea.  5. Depression.  6. Fluid retention.  7. Sleep apnea.  8. Hyperlipidemia.  9. Systolic heart murmur.  10.      Status post hysterectomy.   FAMILY HISTORY:  Mother had bypass surgery.  Father died of heart disease.  No family history of colorectal cancers.   SOCIAL HISTORY:  She is married for 40 years and has three children.  She is  disabled secondary to her back.  She quit smoking in the 1980s.  She takes  whiskey for colds.   REVIEW OF SYSTEMS:  Please see HPI for GI and CARDIOPULMONARY.   PHYSICAL EXAMINATION:  VITAL SIGNS:  Temperature 98; blood pressure 140/90;  pulse 80; height 5 feet 1 inch; weight 288.  GENERAL:  Pleasant, morbidly obese, Caucasian female in no acute distress.  SKIN:  Warm and dry; no jaundice.  HEENT:  Conjunctivae are pink; sclerae are nonicteric; oropharyngeal mucosa  moist and pink, no  lesions, erythema or exudate; no lymphadenopathy,  thyromegaly.  CHEST:  Lungs clear to auscultation.  CARDIAC:  Regular rate and rhythm; 2/6 systolic ejection murmur heard best  in the upper precordium.  ABDOMEN:  Massively obese; positive bowel sounds; soft; she has mild  epigastric tenderness to deep palpation; no organomegaly or masses  appreciated, but limited due to body habitus.  EXTREMITIES:  No edema.   LABORATORIES:  From Nov 14, 2003 - CBC normal, except mildly low hematocrit  at 35.6, LFTs normal.  Barium pill esophagram revealed normal study with 13  mm barium tablet passing readily into the stomach.   IMPRESSION:  The patient is a 65 year old lady recently admitted to the  hospital with chest pain.  She ruled out for myocardial infarction.  It was  felt that her pain was noncardiac in origin.  Etiology of gastroesophageal  reflux disease has been entertained.  She does have typical reflux symptoms  and currently is self-medicating with a homemade remedy.  She is still  having some symptoms, although no frank chest pain.  She denies any further  dysphagia.  Given that she required hospitalization for atypical chest pain,  I feel that it is very reasonable at this time to go ahead and evaluate her  upper gastrointestinal tract with endoscopy.  In addition, she has a history  of chronic diarrhea, which occurs primarily postprandially.  This may be due  to irritable bowel syndrome.  However, she has really had no previous workup  or treatment.  In addition, she has small volume hematochezia occasionally.  She is well overdue for a colonoscopy and we will proceed with a colonoscopy  for both screening and diagnostic purposes.   PLAN:  1. EGD and colonoscopy in the near future.  I discussed risks, alternatives     and benefits with the patient in regards to bleeding, infection and    perforations and she is agreeable to proceed.  2. She will receive SVE prophylaxis given  heart murmur.  3. We will make recommendations regarding any reflux treatment after     endoscopy.     ____________________  R. Roetta Sessions, M.D.     ____________________  Tana Coast,  P.A.     ________________________________________  ___________________________________________  Tana Coast, P.AJonathon Bellows, M.D.   LL/MEDQ  D:  01/11/2004  T:  01/11/2004  Job:  [id number]   cc:   R. Roetta Sessions, M.D.  [address]  [place]  [place] [address]  Fax: [phone]   [address]  [place]  [place] [address]  Fax: [phone]   Vania Rea, M.D.

## 2010-11-07 NOTE — Procedures (Signed)
NAME:  PRENTISS, HAMMETT                       ACCOUNT NO.:  [id number]   MEDICAL RECORD NO.:  [id number]                   PATIENT TYPE:  INP   LOCATION:  A224                                 FACILITY:  [facility]   PHYSICIAN:  Vida Roller, M.D.                DATE OF BIRTH:  18-Nov-1945   DATE OF PROCEDURE:  11/13/2003  DATE OF DISCHARGE:                                  ECHOCARDIOGRAM   PRIMARY PHYSICIAN:  Dr. Othelia Pulling NUMBER:  [id number]   TAPE COUNT:  [id number]   This is a 65 year old female with chest pain and hypertension.  Technical  quality of the study is adequate.   M-MODE TRACINGS:  Aorta is 31 mm.   Left atrium is 35 mm.   Septum is 16 mm.   Posterior wall is 12 mm.   Left ventricular diastolic dimension 40 mm.   Left ventricular systolic dimension 28 mm.   2-D AND DOPPLER IMAGING:  The left ventricle is normal size with normal  systolic function, estimated ejection fraction 55-60%.  There are no obvious  wall motion abnormalities.  Diastolic function was not assessed.   The right ventricle is normal size with normal systolic function.   Both atria appear to be normal size.   The aortic valve has no evidence of stenosis or regurgitation.   The mitral valve has mild insufficiency.  No stenosis.   The tricuspid valve has mild regurgitation, no stenosis.   The pulmonic valve and the ascending aorta were not well seen.   The pericardial structures appear normal.   The inferior vena cava is mildly dilated.      ___________________________________________                                            Vida Roller, M.D.   JH/MEDQ  D:  11/13/2003  T:  11/14/2003  Job:  [id number]

## 2010-11-07 NOTE — Op Note (Signed)
NAME:  Penny Carr, Penny Carr                       ACCOUNT NO.:  [id number]   MEDICAL RECORD NO.:  [id number]                   PATIENT TYPE:  AMB   LOCATION:  DAY                                  FACILITY:  [facility]   PHYSICIAN:  R. Roetta Sessions, M.D.              DATE OF BIRTH:  1946-02-20   DATE OF PROCEDURE:  01/24/2004  DATE OF DISCHARGE:                                 OPERATIVE REPORT   PROCEDURE:  Diagnostic esophagogastroduodenoscopy by colonoscopy with  biopsy, stool sampling.   INDICATIONS FOR PROCEDURE:  Patient is a 65 year old lady with atypical  chest pain.  Cardiac evaluation came up negative.  She has also got chronic  diarrhea.  She comes for EGD and colonoscopy to further evaluate her chest  pain and to further evaluate her diarrhea and in part for colorectal cancer  screening.  This approach has been discussed with the patient at length.  Potential risks, benefits and alternatives have been reviewed, questions  answered.  Please see my January 11, 2004, consultation note for more  information.   PROCEDURE NOTE:  The O2 saturation, blood pressure, pulse, respirations were  monitored throughout the entirety of both procedures.   CONSCIOUS SEDATION:  1. Versed 4 mg IV.  2. Demerol 100 mg IV.   SBE prophylaxis in the way of ampicillin 2 gm IV, gentamicin 120 mg IV.  Prior to the procedure, Cetacaine spray for topical oropharyngeal  anesthesia.   INSTRUMENT:  Olympus video chip system.   FINDINGS ON EXAMINATION:  Tubular esophagus revealed no mucosal  abnormalities.  EG junction easily traversed.  The remaining stomach, colon  and gastric cavity was emptied and __________.  Thorough examination of  gastric mucosa including retroflexed view of the proximal stomach,  esophagogastric junction demonstrated no abnormalities.  The pylorus was  patent and easily traversed.  Examination of bulb and second portion  revealed no abnormalities.   THERAPEUTIC/DIAGNOSTIC MANEUVERS  PERFORMED:  None.   The patient tolerated the procedure well and was prepared for colonoscopy.  Digital rectal exam revealed no abnormalities.   ENDOSCOPIC FINDINGS:  The prep was adequate.   RECTUM:  Examination of rectal mucosa including retroflexed view of the anal  verge revealed no abnormalities.   COLON:  Colonic mucosa was surveyed from the rectosigmoid junction through  the left transverse, right colon to the appendiceal orifice, ileocecal valve  and cecum.  These structures were well seen and photographed for the record.  The terminal ileum was intubated as well.  From this level, the scope was  slowly withdrawn.  All previously mentioned mucosal surfaces were again  seen.  The patient was noted to have sigmoid diverticula and a 3 mm polyp in  the mid descending colon which was cold biopsied.  The terminal ileum  appeared normal.  A stool sample was taken.  Biopsies of the transverse and  sigmoid segments were taken to rule out microscopic colitis.  The patient  tolerated the procedures well, was reactive to endoscopy.   IMPRESSION:  Esophagogastroduodenoscopy normal esophagus, stomach, D1 and  D2.   COLONOSCOPY FINDINGS:  1. Normal rectum.  2. Left-sided diverticula, diminutive polyp mid and descending colon cold     biopsied/removed.  3. Remainder of colonic mucosa appeared normal.  4. Terminal ileum appeared normal.   __________ biopsies taken, stool sample taken.   RECOMMENDATIONS:  1. Gastroesophageal reflux disease literature provided to Ms. Ormond.  2. Followup on biopsies and the stool studies.  3. Further recommendations to follow.      ___________________________________________                                            Penny Carr, M.D.   RMR/MEDQ  D:  01/24/2004  T:  01/24/2004  Job:  [id number]   cc:   [address]  [place]  [place] [address]  Fax: [phone]

## 2010-11-07 NOTE — Discharge Summary (Signed)
NAME:  Penny Carr, Penny Carr                       ACCOUNT NO.:  [id number]   MEDICAL RECORD NO.:  [id number]                   PATIENT TYPE:  INP   LOCATION:  A224                                 FACILITY:  [facility]   PHYSICIAN:  Vania Rea, M.D.              DATE OF BIRTH:  1946-06-02   DATE OF ADMISSION:  11/11/2003  DATE OF DISCHARGE:  11/14/2003                                 DISCHARGE SUMMARY   PRIMARY CARE PHYSICIAN:  Samuel Jester, M.D.   DISCHARGE DIAGNOSES:  1. Chest pain, myocardial infarction ruled out.  2. Dysphagia, status post normal barium swallow.  3. History of gastroesophageal reflux disease.  4. Hyperlipidemia.  5. Sleep apnea syndrome.  6. Chronic depression.  7. Chronic back pain.   DISPOSITION:  Discharged to home.   CONDITION ON DISCHARGE:  Stable.   DISCHARGE MEDICATIONS:  1. Enteric coated aspirin 81 mg daily.  2. Protonix 40 mg b.i.d.  3. Lipitor 20 mg each evening.  4. Zoloft 25 mg daily.  5. Tylenol 650 mg q.4h. while awake.   HOSPITAL COURSE:  Please refer to the history and physical of Nov 11, 2003.  This is a 65 year old Caucasian lady who came to the emergency room with  chest pain for the past three days associated with dysphagia and radiating  into her neck.  The patient was admitted to rule out a myocardial  infarction.  I have three sets of negative cardiac enzymes.  Cardiology was  consulted and noted systolic murmur of unclear etiology, and elected to do a  Cardiolite examination.   Cardiolite examination done today showed no evidence of ischemia.  An  echocardiogram done yesterday revealed mild mitral valve insufficiency, no  stenosis, no abnormalities noted in the aortic valve, mild tricuspid  regurgitation, no stenosis.  The pulmonary valve and ascending aorta were  not well seen.  Pericardial structures appeared normal.  Ejection fraction  was estimated to be 55 to 60%, which was normal.   The patient had a barium swallow to  evaluate her esophagus and this swallow  was normal.  Because of the patient's chest symptoms, we decided she should  have an evaluation by the gastroenterologist to be sure that there are no  ulcers or anything more sinister.  The patient is being discharged on a high  dose of PPI's with the advice to follow up with her primary care physician  and with the GI service.   A fasting lipid panel was done yesterday and this was reported as a total  cholesterol of 239, triglycerides of 211, HDL of 41, LDL of 156, and VLDL of  42.  The patient is being started on Lipitor 20 mg at bedtime.  This will be  further adjusted by her primary care physician.  TSH has been ordered and  will be followed up.   PHYSICAL EXAMINATION:  GENERAL:  Today, the patient is alert and oriented  x3.  She has no complaints.  VITAL SIGNS:  Temperature is 97.6, pulse 62, respirations 20, blood pressure  149/46, she is saturating at 95% on room air.  HEENT:  Pupils equal, round, reactive to light.  CHEST:  Clear to auscultation bilaterally.  CARDIOVASCULAR:  Regular, 2/6 systolic murmur.  ABDOMEN:  Obese, soft, nontender.  EXTREMITIES:  She has no edema.   LABORATORY DATA:  Her ABG today shows a pH of 7.4, PCO2 46, PO2 63,  saturating at 94% on room air.  Her serum chemistries were entirely normal  with the exception of a glucose of 102.  Her sodium is 141, potassium 4.2,  chloride 105, CO2 29, glucose 102, BUN 13, creatinine 0.7, calcium 9.4.  Her  white count is 7.4, hemoglobin 12, hematocrit 35.6, MCV 88.7, RDW 14,  platelet count 253, neutrophils 48, and she has a normal differential.   FOLLOWUP:  1. Follow up with her primary care physician, Dr. Samuel Jester.  2. Follow up with her gastroenterology, Dr. Rehman/Dr. Jena Gauss.     ___________________________________________                                         Vania Rea, M.D.   LC/MEDQ  D:  11/14/2003  T:  11/14/2003  Job:  [id number]   cc:   [address]  [place]  [place] [address]  Fax: [phone]   Lionel December, M.D.  [address]  [place]  [place] [address]  Fax: [phone]   R. Roetta Sessions, M.D.  [address]  [place]  [place] [address]  Fax: [phone]

## 2013-10-11 ENCOUNTER — Emergency Department (HOSPITAL_COMMUNITY): Payer: Medicare Other

## 2013-10-11 ENCOUNTER — Emergency Department (HOSPITAL_COMMUNITY)
Admission: EM | Admit: 2013-10-11 | Discharge: 2013-10-11 | Disposition: A | Discharge status: Home / Self Care | Payer: Medicare Other | Attending: Emergency Medicine | Admitting: Emergency Medicine

## 2013-10-11 ENCOUNTER — Encounter (HOSPITAL_COMMUNITY): Payer: Self-pay | Admitting: Emergency Medicine

## 2013-10-11 DIAGNOSIS — M545 Low back pain, unspecified: Secondary | ICD-10-CM | POA: Insufficient documentation

## 2013-10-11 DIAGNOSIS — F0280 Dementia in other diseases classified elsewhere without behavioral disturbance: Secondary | ICD-10-CM | POA: Insufficient documentation

## 2013-10-11 DIAGNOSIS — G309 Alzheimer's disease, unspecified: Secondary | ICD-10-CM | POA: Insufficient documentation

## 2013-10-11 DIAGNOSIS — E119 Type 2 diabetes mellitus without complications: Secondary | ICD-10-CM | POA: Insufficient documentation

## 2013-10-11 DIAGNOSIS — M549 Dorsalgia, unspecified: Secondary | ICD-10-CM

## 2013-10-11 DIAGNOSIS — Z87891 Personal history of nicotine dependence: Secondary | ICD-10-CM | POA: Insufficient documentation

## 2013-10-11 HISTORY — DX: Type 2 diabetes mellitus without complications: E11.9

## 2013-10-11 HISTORY — DX: Alzheimer's disease, unspecified: G30.9

## 2013-10-11 HISTORY — DX: Dementia in other diseases classified elsewhere, unspecified severity, without behavioral disturbance, psychotic disturbance, mood disturbance, and anxiety: F02.80

## 2013-10-11 MED ORDER — ACETAMINOPHEN 325 MG PO TABS: 650.0000 mg | ORAL_TABLET | Freq: Four times a day (QID) | ORAL | Status: DC | PRN

## 2013-10-11 MED ORDER — ACETAMINOPHEN 325 MG PO TABS
650.0000 mg | ORAL_TABLET | Freq: Once | ORAL | Status: AC
  Administered 2013-10-11: 650 mg via ORAL
  Filled 2013-10-11: qty 2

## 2013-10-11 NOTE — Discharge Instructions (Signed)
As discussed, your evaluation today has been largely reassuring.  But, it is important that you monitor your condition carefully, and do not hesitate to return to the ED if you develop new, or concerning changes in your condition.  Otherwise, please follow-up with your physician for appropriate ongoing care.  Back Pain, Adult Back pain is very common. The pain often gets better over time. The cause of back pain is usually not dangerous. Most people can learn to manage their back pain on their own.  HOME CARE   Stay active. Start with short walks on flat ground if you can. Try to walk farther each day.  Do not sit, drive, or stand in one place for more than 30 minutes. Do not stay in bed.  Do not avoid exercise or work. Activity can help your back heal faster.  Be careful when you bend or lift an object. Bend at your knees, keep the object close to you, and do not twist.  Sleep on a firm mattress. Lie on your side, and bend your knees. If you lie on your back, put a pillow under your knees.  Only take medicines as told by your doctor.  Put ice on the injured area.  Put ice in a plastic bag.  Place a towel between your skin and the bag.  Leave the ice on for 15-20 minutes, 03-04 times a day for the first 2 to 3 days. After that, you can switch between ice and heat packs.  Ask your doctor about back exercises or massage.  Avoid feeling anxious or stressed. Find good ways to deal with stress, such as exercise. GET HELP RIGHT AWAY IF:   Your pain does not go away with rest or medicine.  Your pain does not go away in 1 week.  You have new problems.  You do not feel well.  The pain spreads into your legs.  You cannot control when you poop (bowel movement) or pee (urinate).  Your arms or legs feel weak or lose feeling (numbness).  You feel sick to your stomach (nauseous) or throw up (vomit).  You have belly (abdominal) pain.  You feel like you may pass out (faint). MAKE  SURE YOU:   Understand these instructions.  Will watch your condition.  Will get help right away if you are not doing well or get worse. Document Released: 11/25/2007 Document Revised: 08/31/2011 Document Reviewed: 10/27/2010 [facility] Patient Information 2014 [place], [place].  Back Exercises Back exercises help treat and prevent back injuries. The goal is to increase your strength in your belly (abdominal) and back muscles. These exercises can also help with flexibility. Start these exercises when told by your doctor. HOME CARE Back exercises include: Pelvic Tilt.  Lie on your back with your knees bent. Tilt your pelvis until the lower part of your back is against the floor. Hold this position 5 to 10 sec. Repeat this exercise 5 to 10 times. Knee to Chest.  Pull 1 knee up against your chest and hold for 20 to 30 seconds. Repeat this with the other knee. This may be done with the other leg straight or bent, whichever feels better. Then, pull both knees up against your chest. Sit-Ups or Curl-Ups.  Bend your knees 90 degrees. Start with tilting your pelvis, and do a partial, slow sit-up. Only lift your upper half 30 to 45 degrees off the floor. Take at least 2 to 3 seonds for each sit-up. Do not do sit-ups with your knees out  straight. If partial sit-ups are difficult, simply do the above but with only tightening your belly (abdominal) muscles and holding it as told. Hip-Lift.  Lie on your back with your knees flexed 90 degrees. Push down with your feet and shoulders as you raise your hips 2 inches off the floor. Hold for 10 seconds, repeat 5 to 10 times. Back Arches.  Lie on your stomach. Prop yourself up on bent elbows. Slowly press on your hands, causing an arch in your low back. Repeat 3 to 5 times. Shoulder-Lifts.  Lie face down with arms beside your body. Keep hips and belly pressed to floor as you slowly lift your head and shoulders off the floor. Do not overdo your exercises. Be  careful in the beginning. Exercises may cause you some mild back discomfort. If the pain lasts for more than 15 minutes, stop the exercises until you see your doctor. Improvement with exercise for back problems is slow.  Document Released: 07/11/2010 Document Revised: 08/31/2011 Document Reviewed: 04/09/2011 [facility] Patient Information 2014 [place], [place].

## 2013-10-11 NOTE — ED Provider Notes (Signed)
CSN: [id number]     Arrival date & time 10/11/13  1202 History   This chart was scribed for Gerhard Munchobert Taleah Bellantoni, MD by Manuela Schwartzaylor Day, ED scribe. This patient was seen in room APA01/APA01 and the patient's care was started at 1202.  Chief Complaint  Patient presents with  . Back Pain   The history is provided by the patient (Pt's son). No language interpreter was used.   HPI Comments: Penny Carr is a 68 y.o. female who presents to the Emergency Department for chronic, intermittent back pain, gradually worsened over the past few days having no recent trauma, falls or injury. She has a hx of beginning stage alzheimer's; lives at home alone and per pt's son who wants to have her with assisted living; she will refuse. Today she reports having lower back pain, chronic in nature and no hx of prior injury to which caused her pain. She took ibuprofen w/mild relief in pain. She denies numbness/weakness of her extremities. Last year she tried hydrocodone for this problem but states that she doesn't like to take medicine.   Past Medical History  Diagnosis Date  . Diabetes mellitus without complication   . Alzheimer disease    History reviewed. No pertinent past surgical history. History reviewed. No pertinent family history. History  Substance Use Topics  . Smoking status: Former Games developermoker  . Smokeless tobacco: Not on file  . Alcohol Use: No   OB History   Grav Para Term Preterm Abortions TAB SAB Ect Mult Living                 Review of Systems  Constitutional: Negative for fever and chills.  Respiratory: Negative for cough and shortness of breath.   Cardiovascular: Negative for chest pain.  Gastrointestinal: Negative for nausea and vomiting.  Musculoskeletal: Positive for back pain.  Skin: Negative for rash.  All other systems reviewed and are negative.  Allergies  Cortisone  Home Medications   Prior to Admission medications   Not on File   Triage Vitals: BP 147/70  Pulse 52   Temp(Src) 97.6 F (36.4 C) (Oral)  Resp 20  SpO2 98%  Physical Exam  Nursing note and vitals reviewed. Constitutional: She is oriented to person, place, and time. She appears well-developed and well-nourished. No distress.  HENT:  Head: Normocephalic and atraumatic.  Eyes: Conjunctivae are normal. Right eye exhibits no discharge. Left eye exhibits no discharge.  Neck: Normal range of motion.  Cardiovascular: Normal rate.   Pulmonary/Chest: Effort normal. No respiratory distress.  Musculoskeletal: Normal range of motion. She exhibits no edema.  Back pain reproducible with flexion of both legs  Neurological: She is alert and oriented to person, place, and time.  Skin: Skin is warm and dry.  Psychiatric: She has a normal mood and affect. Her speech is normal. She is slowed. Cognition and memory are impaired. She exhibits abnormal recent memory and abnormal remote memory.    ED Course  Procedures (including critical care time) DIAGNOSTIC STUDIES: Oxygen Saturation is 98% on room air, normal by my interpretation.    COORDINATION OF CARE: At 1245 PM Discussed treatment plan with patient which includes tylenol, lumbar spine X-ray. Patient agrees.    Imaging Review Dg Lumbar Spine Complete  10/11/2013   CLINICAL DATA:  Low back pain  EXAM: LUMBAR SPINE - COMPLETE 4+ VIEW  COMPARISON:  None.  FINDINGS: There are 5 nonrib bearing lumbar-type vertebral bodies. The vertebral body heights are maintained. The alignment is anatomic. There  is no spondylolysis. There is no acute fracture or static listhesis. Minimal degenerative disc disease at L3-4. Mild facet arthropathy at L5-S1. There is mild degenerative disc disease of the lower thoracic spine.  The SI joints are unremarkable.  There is abdominal aortic atherosclerosis.  IMPRESSION: No acute osseous injury of the lumbar spine. Mild degenerative disc disease at L3-4. Mild degenerative facet arthropathy at L5-S1.   Electronically Signed   By:  Elige KoHetal  Patel   On: 10/11/2013 13:34   2:20 PM On repeat exam the patient appears comfortable. I have discussed her case with our social worker team will assist with home health assistance.   MDM   Pleasant elderly female with dementia presents with ongoing low back pain.  On exam she is awake, alert, and in no distress, hemodynamically stable moving all extremities spontaneously. Patient's x-ray does not demonstrate compression fracture, or acute deformity. I discussed the patient's case with our social worker team to arrange for home health assistance. The patient was discharged in stable condition.   I personally performed the services described in this documentation, which was scribed in my presence. The recorded information has been reviewed and is accurate.      Gerhard Munchobert Doron Shake, MD 10/11/13 [phone]

## 2013-10-11 NOTE — ED Notes (Signed)
Pt c/o intermittent lower back pain "for a long time". Pt denies any injury.

## 2013-10-11 NOTE — Care Management (Signed)
Pt needs to have HH set up. She is showing traditional Medicare in the computer, but pt and son think she has [facility], and son,James has 3 UHC cards,  with no dates on cards. Pt states she had a nurse who came to see her at one time, but does not think she comes anymore. [facility] aide [facility]. Son asks that this be coordinated through him due to his mother's forgetfulness/ dementia. His # is [phone]. HH set up with [facility], as they had no preference.  Checked with ED admitting deck and pt has straight medicare, and has not had UHC since 12/13. Notified Fayrene FearingJames, and he was given the medicare.gov web site to apply for advantage plans if he would like to have this coverage.

## 2013-10-11 NOTE — ED Notes (Signed)
Patient with no complaints at this time. Respirations even and unlabored. Skin warm/dry. Discharge instructions reviewed with patient at this time. Patient given opportunity to voice concerns/ask questions. Patient discharged at this time and left Emergency Department via wheelchair. 

## 2013-10-11 NOTE — Care Management Note (Signed)
Patient was noted to not have a PCP listed, but per patient PCP is Dr Charm BargesButler. Entered this information into computer.

## 2015-04-04 ENCOUNTER — Encounter: Payer: Self-pay | Admitting: Internal Medicine

## 2018-05-25 ENCOUNTER — Emergency Department (HOSPITAL_COMMUNITY): Payer: Medicare Other

## 2018-05-25 ENCOUNTER — Encounter (HOSPITAL_COMMUNITY): Payer: Self-pay

## 2018-05-25 ENCOUNTER — Other Ambulatory Visit: Payer: Self-pay

## 2018-05-25 ENCOUNTER — Emergency Department (HOSPITAL_COMMUNITY)
Admission: EM | Admit: 2018-05-25 | Discharge: 2018-05-25 | Disposition: A | Discharge status: Home / Self Care | Payer: Medicare Other | Attending: Emergency Medicine | Admitting: Emergency Medicine

## 2018-05-25 DIAGNOSIS — Y92129 Unspecified place in nursing home as the place of occurrence of the external cause: Secondary | ICD-10-CM | POA: Diagnosis not present

## 2018-05-25 DIAGNOSIS — Y9389 Activity, other specified: Secondary | ICD-10-CM | POA: Insufficient documentation

## 2018-05-25 DIAGNOSIS — W19XXXA Unspecified fall, initial encounter: Secondary | ICD-10-CM

## 2018-05-25 DIAGNOSIS — G309 Alzheimer's disease, unspecified: Secondary | ICD-10-CM | POA: Diagnosis not present

## 2018-05-25 DIAGNOSIS — F0280 Dementia in other diseases classified elsewhere without behavioral disturbance: Secondary | ICD-10-CM | POA: Diagnosis not present

## 2018-05-25 DIAGNOSIS — S01112A Laceration without foreign body of left eyelid and periocular area, initial encounter: Secondary | ICD-10-CM | POA: Diagnosis not present

## 2018-05-25 DIAGNOSIS — W0110XA Fall on same level from slipping, tripping and stumbling with subsequent striking against unspecified object, initial encounter: Secondary | ICD-10-CM | POA: Diagnosis not present

## 2018-05-25 DIAGNOSIS — R39198 Other difficulties with micturition: Secondary | ICD-10-CM | POA: Insufficient documentation

## 2018-05-25 DIAGNOSIS — Y998 Other external cause status: Secondary | ICD-10-CM | POA: Diagnosis not present

## 2018-05-25 DIAGNOSIS — Z23 Encounter for immunization: Secondary | ICD-10-CM | POA: Diagnosis not present

## 2018-05-25 DIAGNOSIS — Z87891 Personal history of nicotine dependence: Secondary | ICD-10-CM | POA: Diagnosis not present

## 2018-05-25 DIAGNOSIS — S0990XA Unspecified injury of head, initial encounter: Secondary | ICD-10-CM | POA: Diagnosis present

## 2018-05-25 DIAGNOSIS — E119 Type 2 diabetes mellitus without complications: Secondary | ICD-10-CM | POA: Insufficient documentation

## 2018-05-25 LAB — URINALYSIS, ROUTINE W REFLEX MICROSCOPIC
Bilirubin Urine: NEGATIVE
Glucose, UA: NEGATIVE mg/dL
Hgb urine dipstick: NEGATIVE
Ketones, ur: NEGATIVE mg/dL
LEUKOCYTES UA: NEGATIVE
Nitrite: NEGATIVE
Protein, ur: NEGATIVE mg/dL
Specific Gravity, Urine: 1.008 (ref 1.005–1.030)
pH: 6 (ref 5.0–8.0)

## 2018-05-25 LAB — CBC WITH DIFFERENTIAL/PLATELET
ABS IMMATURE GRANULOCYTES: 0.04 10*3/uL (ref 0.00–0.07)
BASOS ABS: 0 10*3/uL (ref 0.0–0.1)
BASOS PCT: 0 %
EOS ABS: 0 10*3/uL (ref 0.0–0.5)
Eosinophils Relative: 0 %
HCT: 48.5 % — ABNORMAL HIGH (ref 36.0–46.0)
Hemoglobin: 14.4 g/dL (ref 12.0–15.0)
IMMATURE GRANULOCYTES: 0 %
Lymphocytes Relative: 28 %
Lymphs Abs: 3.1 10*3/uL (ref 0.7–4.0)
MCH: 28.7 pg (ref 26.0–34.0)
MCHC: 29.7 g/dL — ABNORMAL LOW (ref 30.0–36.0)
MCV: 96.6 fL (ref 80.0–100.0)
Monocytes Absolute: 0.8 10*3/uL (ref 0.1–1.0)
Monocytes Relative: 7 %
NEUTROS PCT: 65 %
NRBC: 0 % (ref 0.0–0.2)
Neutro Abs: 7 10*3/uL (ref 1.7–7.7)
PLATELETS: 267 10*3/uL (ref 150–400)
RBC: 5.02 MIL/uL (ref 3.87–5.11)
RDW: 15.1 % (ref 11.5–15.5)
WBC: 11 10*3/uL — AB (ref 4.0–10.5)

## 2018-05-25 LAB — COMPREHENSIVE METABOLIC PANEL
ALT: 19 U/L (ref 0–44)
AST: 20 U/L (ref 15–41)
Albumin: 4.3 g/dL (ref 3.5–5.0)
Alkaline Phosphatase: 70 U/L (ref 38–126)
Anion gap: 12 (ref 5–15)
BUN: 20 mg/dL (ref 8–23)
CO2: 33 mmol/L — ABNORMAL HIGH (ref 22–32)
CREATININE: 0.97 mg/dL (ref 0.44–1.00)
Calcium: 10.7 mg/dL — ABNORMAL HIGH (ref 8.9–10.3)
Chloride: 100 mmol/L (ref 98–111)
GFR calc Af Amer: >60 mL/min (ref >60)
GFR, EST NON AFRICAN AMERICAN: 58 mL/min — AB (ref >60)
Glucose, Bld: 122 mg/dL — ABNORMAL HIGH (ref 70–99)
Potassium: 4.1 mmol/L (ref 3.5–5.1)
Sodium: 145 mmol/L (ref 135–145)
Total Bilirubin: 0.7 mg/dL (ref 0.3–1.2)
Total Protein: 7.6 g/dL (ref 6.5–8.1)

## 2018-05-25 LAB — LIPASE, BLOOD: Lipase: 26 U/L (ref 11–51)

## 2018-05-25 MED ORDER — LIDOCAINE-EPINEPHRINE-TETRACAINE (LET) SOLUTION
3.0000 mL | Freq: Once | NASAL | Status: AC
  Administered 2018-05-25: 3 mL via TOPICAL
  Filled 2018-05-25: qty 3

## 2018-05-25 MED ORDER — TETANUS-DIPHTH-ACELL PERTUSSIS 5-2.5-18.5 LF-MCG/0.5 IM SUSP
0.5000 mL | Freq: Once | INTRAMUSCULAR | Status: AC
  Administered 2018-05-25: 0.5 mL via INTRAMUSCULAR
  Filled 2018-05-25: qty 0.5

## 2018-05-25 MED ORDER — LIDOCAINE-EPINEPHRINE (PF) 2 %-1:200000 IJ SOLN
10.0000 mL | Freq: Once | INTRAMUSCULAR | Status: AC
  Administered 2018-05-25: 10 mL via INTRADERMAL
  Filled 2018-05-25: qty 10

## 2018-05-25 NOTE — ED Notes (Addendum)
[facility] called for transport.  

## 2018-05-25 NOTE — ED Notes (Signed)
Bed: [id number] Expected date:  Expected time:  Means of arrival:  Comments: EMS-nursing home fall

## 2018-05-25 NOTE — Discharge Instructions (Signed)
Your work-up today showed no evidence of new infection in your lungs were urine.  The imaging did not show evidence of new fracture or dislocation in your head or neck.  It does look like he may have had old stroke on your CT head, please follow-up with your primary doctor for further monitoring.  You sustained a laceration with no underlying fracture.  We washed out and repaired your laceration with nonabsorbable sutures.  You will need to have these removed in between 10 to 14 days.  Please watch for signs and symptoms of infection.  We covered the wound with bacitracin which helps prevent infection.  If any symptoms change or worsen, please return to the nearest emergency department.

## 2018-05-25 NOTE — ED Triage Notes (Signed)
[facility].  Head lac over L eye. Pt was last seen sitting on side table before fall.  Staff states she was only on the ground 10 min. Pt at baseline, oriented to person only.

## 2018-05-25 NOTE — ED Notes (Signed)
Suture cart at bedside 

## 2018-05-25 NOTE — ED Notes (Signed)
PT difficult stick, RN and tech attempt total of three times

## 2018-05-25 NOTE — ED Provider Notes (Signed)
[facility]-EMERGENCY DEPT Provider Note   CSN: [id number] Arrival date & time: 05/25/18  1113     History   Chief Complaint Chief Complaint  Patient presents with  . Fall  . Facial Laceration    HPI Penny Carr is a 72 y.o. female.  The history is provided by medical records and the nursing home. No language interpreter was used.  Fall  This is a new problem. The current episode started 1 to 2 hours ago. The problem occurs constantly. The problem has been resolved. Associated symptoms include headaches. Pertinent negatives include no chest pain, no abdominal pain and no shortness of breath. Nothing aggravates the symptoms. Nothing relieves the symptoms. She has tried nothing for the symptoms. The treatment provided no relief.    Past Medical History:  Diagnosis Date  . Alzheimer disease (HCC)   . Diabetes mellitus without complication [facility])     Patient Active Problem List   Diagnosis Date Noted  . DIVERTICULITIS, HX OF 03/26/2010    History reviewed. No pertinent surgical history.   OB History   None      Home Medications    Prior to Admission medications   Medication Sig Start Date End Date Taking? Authorizing Provider  acetaminophen (TYLENOL) 325 MG tablet Take 2 tablets (650 mg total) by mouth every 6 (six) hours as needed. 10/11/13   Gerhard Munch, MD  ibuprofen (ADVIL,MOTRIN) 200 MG tablet Take 400 mg by mouth daily as needed for moderate pain.    [provider]    Family History History reviewed. No pertinent family history.  Social History Social History   Tobacco Use  . Smoking status: Former Smoker  Substance Use Topics  . Alcohol use: No  . Drug use: No     Allergies   Patient has no active allergies.   Review of Systems Review of Systems  Unable to perform ROS: Dementia  Constitutional: Negative for chills, diaphoresis, fatigue and fever.  HENT: Negative for ear pain and sore throat.   Eyes:  Negative for pain and visual disturbance.  Respiratory: Positive for cough. Negative for shortness of breath.   Cardiovascular: Negative for chest pain and palpitations.  Gastrointestinal: Negative for abdominal pain and vomiting.  Genitourinary: Positive for decreased urine volume. Negative for dysuria and hematuria.  Musculoskeletal: Negative for arthralgias, back pain, neck pain and neck stiffness.  Skin: Negative for color change, rash and wound.  Neurological: Positive for headaches. Negative for dizziness, seizures, syncope and light-headedness.  Psychiatric/Behavioral: Negative for agitation and confusion.  All other systems reviewed and are negative.    Physical Exam Updated Vital Signs BP (!) 117/53 (BP Location: Right Arm)   Pulse 65   Temp (!) 97.3 F (36.3 C) (Axillary)   Resp 12   Ht 5\' 5"  (1.651 m)   Wt 99.8 kg   SpO2 97%   BMI 36.61 kg/m   Physical Exam  Constitutional: She appears well-developed and well-nourished. No distress.  HENT:  Head: Head is with laceration.    Right Ear: External ear normal.  Left Ear: External ear normal.  Nose: Nose normal.  Mouth/Throat: Oropharynx is clear and moist. No oropharyngeal exudate.  Eyes: Pupils are equal, round, and reactive to light. Conjunctivae and EOM are normal.  Neck: Normal range of motion. Neck supple.  Cardiovascular: Normal rate, regular rhythm and intact distal pulses.  No murmur heard. Pulmonary/Chest: Effort normal and breath sounds normal. No respiratory distress. She has no wheezes. She has  no rales. She exhibits no tenderness.  Abdominal: Soft. There is no tenderness.  Musculoskeletal: She exhibits tenderness. She exhibits no edema.  Neurological: She is alert. She is disoriented. No sensory deficit. She exhibits normal muscle tone. GCS eye subscore is 4. GCS verbal subscore is 5. GCS motor subscore is 6.  Patient oriented to person.  This was confirmed to be her baseline speaking to the facility.    Skin: Skin is warm and dry. Capillary refill takes less than 2 seconds. She is not diaphoretic. No erythema.  Psychiatric: She has a normal mood and affect.  Nursing note and vitals reviewed.    ED Treatments / Results  Labs (all labs ordered are listed, but only abnormal results are displayed) Labs Reviewed  CBC WITH DIFFERENTIAL/PLATELET - Abnormal; Notable for the following components:      Result Value   WBC 11.0 (*)    HCT 48.5 (*)    MCHC 29.7 (*)    All other components within normal limits  COMPREHENSIVE METABOLIC PANEL - Abnormal; Notable for the following components:   CO2 33 (*)    Glucose, Bld 122 (*)    Calcium 10.7 (*)    GFR calc non Af Amer 58 (*)    All other components within normal limits  URINALYSIS, ROUTINE W REFLEX MICROSCOPIC - Abnormal; Notable for the following components:   Color, Urine STRAW (*)    All other components within normal limits  URINE CULTURE  LIPASE, BLOOD    EKG None  Radiology Dg Chest 2 View  Result Date: 05/25/2018 CLINICAL DATA:  Fall.  Chest pain EXAM: CHEST - 2 VIEW COMPARISON:  03/26/2010 FINDINGS: Mild cardiac enlargement. Negative for heart failure. Negative for infiltrate effusion or mass. IMPRESSION: No active cardiopulmonary disease. Electronically Signed   By: Marlan Palau M.D.   On: 05/25/2018 13:20   Ct Head Wo Contrast  Result Date: 05/25/2018 CLINICAL DATA:  Unwitnessed fall. EXAM: CT HEAD WITHOUT CONTRAST CT CERVICAL SPINE WITHOUT CONTRAST TECHNIQUE: Multidetector CT imaging of the head and cervical spine was performed following the standard protocol without intravenous contrast. Multiplanar CT image reconstructions of the cervical spine were also generated. COMPARISON:  None. FINDINGS: CT HEAD FINDINGS Brain: No subdural, epidural, or subarachnoid hemorrhage. Cerebellum, brainstem, and basal cisterns are normal. Ventricles and sulci are unremarkable. Tiny lacunar infarct in the anterior limb of the right internal  capsule. Scattered white matter changes noted. No acute cortical ischemia or infarct. Ventricles and sulci are unremarkable. No mass effect or midline shift. Vascular: No hyperdense vessel or unexpected calcification. Skull: Normal. Negative for fracture or focal lesion. Sinuses/Orbits: No acute finding. Other: There is a laceration over the left eye. The globe is intact extracranial soft tissues otherwise normal. CT CERVICAL SPINE FINDINGS Alignment: No traumatic malalignment. Mild reversal of normal lordosis centered at C5-6. Skull base and vertebrae: No acute fracture. No primary bone lesion or focal pathologic process. Soft tissues and spinal canal: Narrowing of the spinal canal at C5-6 due to a posterior osteophyte. The anterior diameter of the canal in this region is 6 mm. No other abnormalities identified. Disc levels:  Multilevel degenerative changes. Upper chest: Negative. Other: No other abnormalities. IMPRESSION: 1. Scattered white matter changes. Tiny age-indeterminate lacunar infarct in the anterior limb of the right internal capsule. No other acute intracranial abnormalities are noted. 2. Degenerative changes, most marked at C5-6. Narrowing of the spinal canal to 6 mm in AP diameter at C5-6 due to a posterior osteophyte. 3.  No fracture or traumatic malalignment in the cervical spine. Electronically Signed   By: Gerome Samavid  Williams III M.D   On: 05/25/2018 15:05   Ct Cervical Spine Wo Contrast  Result Date: 05/25/2018 CLINICAL DATA:  Unwitnessed fall. EXAM: CT HEAD WITHOUT CONTRAST CT CERVICAL SPINE WITHOUT CONTRAST TECHNIQUE: Multidetector CT imaging of the head and cervical spine was performed following the standard protocol without intravenous contrast. Multiplanar CT image reconstructions of the cervical spine were also generated. COMPARISON:  None. FINDINGS: CT HEAD FINDINGS Brain: No subdural, epidural, or subarachnoid hemorrhage. Cerebellum, brainstem, and basal cisterns are normal. Ventricles  and sulci are unremarkable. Tiny lacunar infarct in the anterior limb of the right internal capsule. Scattered white matter changes noted. No acute cortical ischemia or infarct. Ventricles and sulci are unremarkable. No mass effect or midline shift. Vascular: No hyperdense vessel or unexpected calcification. Skull: Normal. Negative for fracture or focal lesion. Sinuses/Orbits: No acute finding. Other: There is a laceration over the left eye. The globe is intact extracranial soft tissues otherwise normal. CT CERVICAL SPINE FINDINGS Alignment: No traumatic malalignment. Mild reversal of normal lordosis centered at C5-6. Skull base and vertebrae: No acute fracture. No primary bone lesion or focal pathologic process. Soft tissues and spinal canal: Narrowing of the spinal canal at C5-6 due to a posterior osteophyte. The anterior diameter of the canal in this region is 6 mm. No other abnormalities identified. Disc levels:  Multilevel degenerative changes. Upper chest: Negative. Other: No other abnormalities. IMPRESSION: 1. Scattered white matter changes. Tiny age-indeterminate lacunar infarct in the anterior limb of the right internal capsule. No other acute intracranial abnormalities are noted. 2. Degenerative changes, most marked at C5-6. Narrowing of the spinal canal to 6 mm in AP diameter at C5-6 due to a posterior osteophyte. 3. No fracture or traumatic malalignment in the cervical spine. Electronically Signed   By: Gerome Samavid  Williams III M.D   On: 05/25/2018 15:05    Procedures .Marland Kitchen.Laceration Repair Date/Time: 05/25/2018 4:55 PM Performed by: Heide Scalesegeler, Christopher J, MD Authorized by: Heide Scalesegeler, Christopher J, MD   Consent:    Consent obtained:  Emergent situation   Consent given by:  Patient   Risks discussed:  Infection, pain, poor cosmetic result and poor wound healing   Alternatives discussed:  No treatment Anesthesia (see MAR for exact dosages):    Anesthesia method:  Topical application and local  infiltration   Topical anesthetic:  LET   Local anesthetic:  Lidocaine 1% WITH epi Laceration details:    Location:  Face   Face location:  L eyebrow   Length (cm):  3   Depth (mm):  1 Repair type:    Repair type:  Simple Pre-procedure details:    Preparation:  Patient was prepped and draped in usual sterile fashion Exploration:    Hemostasis achieved with:  Direct pressure   Wound exploration: wound explored through full range of motion and entire depth of wound probed and visualized     Wound extent: no foreign bodies/material noted, no muscle damage noted and no underlying fracture noted     Contaminated: no   Treatment:    Area cleansed with:  Saline   Amount of cleaning:  Standard   Irrigation solution:  Sterile saline   Irrigation method:  Syringe   Visualized foreign bodies/material removed: no   Skin repair:    Repair method:  Sutures   Suture size:  5-0   Wound skin closure material used: ethilon.   Suture technique:  Simple  interrupted   Number of sutures:  3 Approximation:    Approximation:  Close Post-procedure details:    Dressing:  Antibiotic ointment and non-adherent dressing   Patient tolerance of procedure:  Tolerated well, no immediate complications   (including critical care time)  Medications Ordered in ED Medications  Tdap (BOOSTRIX) injection 0.5 mL (0.5 mLs Intramuscular Given 05/25/18 1341)  lidocaine-EPINEPHrine-tetracaine (LET) solution (3 mLs Topical Given 05/25/18 1340)  lidocaine-EPINEPHrine (XYLOCAINE W/EPI) 2 %-1:200000 (PF) injection 10 mL (10 mLs Intradermal Given 05/25/18 1536)     Initial Impression / Assessment and Plan / ED Course  I have reviewed the triage vital signs and the nursing notes.  Pertinent labs & imaging results that were available during my care of the patient were reviewed by me and considered in my medical decision making (see chart for details).     Penny Carr is a 72 y.o. female with a past medical history  significant for Alzheimer disease and diabetes who presents for unwitnessed fall.  I spoke with patient's living facility, [facility], who reports that patient was sitting at a table today and then was found to have fallen on the ground after about 10 minutes.  Patient has a 3 similar laceration to her left eyebrow which is hemostatic with a Band-Aid.  Patient reports mild headache but denies any vision changes, nausea, or vomiting.  Patient is alert to person which the facility reports is her baseline.  They report her mental status is at her baseline and they feel she has been otherwise acting normally for the last few days.  Patient does state that she has had a slight cough and has had some decreased urination recently.  She denies any abdominal pain, back pain, chest pain, or extremity pains.  She was placed in a cervical motorization, arrival however it was dislodged and was causing the patient distress on arrival.  The collar was removed and she was denying significant neck tenderness or pain.  Collar was removed however will obtain CT imaging of her neck to look for injury.  On exam, patient is oriented to person but not place or time.  Patient moving all extremities.  Patient has wraps around both of her legs but she has no tenderness.  Patient has no numbness or weakness in her legs as she was able to lift both legs.  Lungs were clear and chest was nontender.  Back was nontender, abdomen was nontender.  Patient patient has normal extraocular movements and had symmetric reactivity of pupils.  No asymmetry with pupils.  Low suspicion for retro-bulbar hematoma.  Patient has a 3 center laceration vertically over left eyebrow and some bruising.  Patient was CT imaging of the head and neck as well as a chest x-ray with her recent dry cough and labs including urine to look for occult infection.  Anticipate reassessment after work-up and patient will have her laceration repaired.  Anticipate discharge  back to her facility after work-up.  Patient did not know her last tetanus shot and we could not find it in the chart.  Patient will be given Tdap.  4:29 PM Patient's imaging is all returned.  No evidence of acute abnormality.  Patient has age-indeterminate small stroke however her neurologic exam did not reveal any acute abnormalities.  Suspect old stroke.  Laboratory testing was overall reassuring.  Imaging showed no fracture dislocation of the head or neck.  No evidence of pneumonia or urinary tract infection on labs and imaging.  Laceration  was repaired without difficulty with 3 nonabsorbable sutures.  She will need to have them removed in the next week.  Wound was washed out and after repair was covered in bacitracin and bandage.  Patient was discharged back to her facility for outpatient follow-up and management.   Final Clinical Impressions(s) / ED Diagnoses   Final diagnoses:  Fall, initial encounter  Laceration of left eyebrow, initial encounter    ED Discharge Orders    None      Clinical Impression: 1. Fall, initial encounter   2. Laceration of left eyebrow, initial encounter     Disposition: Discharge  Condition: Good  I have discussed the results, Dx and Tx plan with the pt(& family if present). He/she/they expressed understanding and agree(s) with the plan. Discharge instructions discussed at great length. Strict return precautions discussed and pt &/or family have verbalized understanding of the instructions. No further questions at time of discharge.    New Prescriptions   No medications on file    Follow Up: Samuel Jester, DO [address] [place] [address] [phone]     [facility]-EM[facility]GENCY DEPT [address] [id number] mc [address] [place] [address] [phone]       Tegeler, Canary Brim, MD 05/25/18 [phone]

## 2018-05-27 LAB — URINE CULTURE: Culture: 70000 — AB

## 2018-05-28 ENCOUNTER — Telehealth: Payer: Self-pay

## 2018-05-28 NOTE — Telephone Encounter (Signed)
[facility] treatment for UC [facility]

## 2018-06-20 ENCOUNTER — Encounter (HOSPITAL_COMMUNITY): Payer: Self-pay | Admitting: *Deleted

## 2018-06-20 ENCOUNTER — Inpatient Hospital Stay (HOSPITAL_COMMUNITY)
Admission: EM | Admit: 2018-06-20 | Discharge: 2018-06-25 | DRG: 640 | Disposition: A | Discharge status: Home / Self Care | Payer: Medicare Other | Attending: Internal Medicine | Admitting: Internal Medicine

## 2018-06-20 ENCOUNTER — Emergency Department (HOSPITAL_COMMUNITY): Payer: Medicare Other

## 2018-06-20 ENCOUNTER — Inpatient Hospital Stay (HOSPITAL_COMMUNITY): Payer: Medicare Other

## 2018-06-20 DIAGNOSIS — R680 Hypothermia, not associated with low environmental temperature: Secondary | ICD-10-CM | POA: Diagnosis not present

## 2018-06-20 DIAGNOSIS — I959 Hypotension, unspecified: Secondary | ICD-10-CM

## 2018-06-20 DIAGNOSIS — L03116 Cellulitis of left lower limb: Secondary | ICD-10-CM | POA: Diagnosis present

## 2018-06-20 DIAGNOSIS — L03119 Cellulitis of unspecified part of limb: Secondary | ICD-10-CM | POA: Diagnosis not present

## 2018-06-20 DIAGNOSIS — Z79899 Other long term (current) drug therapy: Secondary | ICD-10-CM

## 2018-06-20 DIAGNOSIS — E860 Dehydration: Secondary | ICD-10-CM | POA: Diagnosis present

## 2018-06-20 DIAGNOSIS — R579 Shock, unspecified: Secondary | ICD-10-CM | POA: Diagnosis present

## 2018-06-20 DIAGNOSIS — F0280 Dementia in other diseases classified elsewhere without behavioral disturbance: Secondary | ICD-10-CM | POA: Diagnosis present

## 2018-06-20 DIAGNOSIS — F0390 Unspecified dementia without behavioral disturbance: Secondary | ICD-10-CM | POA: Diagnosis not present

## 2018-06-20 DIAGNOSIS — I248 Other forms of acute ischemic heart disease: Secondary | ICD-10-CM | POA: Diagnosis not present

## 2018-06-20 DIAGNOSIS — F329 Major depressive disorder, single episode, unspecified: Secondary | ICD-10-CM | POA: Diagnosis not present

## 2018-06-20 DIAGNOSIS — I10 Essential (primary) hypertension: Secondary | ICD-10-CM | POA: Diagnosis present

## 2018-06-20 DIAGNOSIS — Y92009 Unspecified place in unspecified non-institutional (private) residence as the place of occurrence of the external cause: Secondary | ICD-10-CM

## 2018-06-20 DIAGNOSIS — R001 Bradycardia, unspecified: Secondary | ICD-10-CM | POA: Diagnosis not present

## 2018-06-20 DIAGNOSIS — E119 Type 2 diabetes mellitus without complications: Secondary | ICD-10-CM | POA: Diagnosis present

## 2018-06-20 DIAGNOSIS — G309 Alzheimer's disease, unspecified: Secondary | ICD-10-CM | POA: Diagnosis present

## 2018-06-20 DIAGNOSIS — G9341 Metabolic encephalopathy: Secondary | ICD-10-CM | POA: Diagnosis not present

## 2018-06-20 DIAGNOSIS — T502X5A Adverse effect of carbonic-anhydrase inhibitors, benzothiadiazides and other diuretics, initial encounter: Secondary | ICD-10-CM | POA: Diagnosis not present

## 2018-06-20 DIAGNOSIS — I878 Other specified disorders of veins: Secondary | ICD-10-CM | POA: Diagnosis not present

## 2018-06-20 DIAGNOSIS — T465X5A Adverse effect of other antihypertensive drugs, initial encounter: Secondary | ICD-10-CM | POA: Diagnosis not present

## 2018-06-20 DIAGNOSIS — Z87891 Personal history of nicotine dependence: Secondary | ICD-10-CM | POA: Diagnosis not present

## 2018-06-20 DIAGNOSIS — E861 Hypovolemia: Secondary | ICD-10-CM

## 2018-06-20 DIAGNOSIS — L03115 Cellulitis of right lower limb: Secondary | ICD-10-CM | POA: Diagnosis not present

## 2018-06-20 DIAGNOSIS — I9589 Other hypotension: Secondary | ICD-10-CM

## 2018-06-20 DIAGNOSIS — I952 Hypotension due to drugs: Secondary | ICD-10-CM | POA: Diagnosis not present

## 2018-06-20 DIAGNOSIS — R7989 Other specified abnormal findings of blood chemistry: Secondary | ICD-10-CM

## 2018-06-20 LAB — CBC WITH DIFFERENTIAL/PLATELET
Abs Immature Granulocytes: 0.02 10*3/uL (ref 0.00–0.07)
BASOS PCT: 1 %
Basophils Absolute: 0 10*3/uL (ref 0.0–0.1)
Eosinophils Absolute: 0.1 10*3/uL (ref 0.0–0.5)
Eosinophils Relative: 2 %
HCT: 38.4 % (ref 36.0–46.0)
Hemoglobin: 11.5 g/dL — ABNORMAL LOW (ref 12.0–15.0)
Immature Granulocytes: 0 %
Lymphocytes Relative: 41 %
Lymphs Abs: 2.4 10*3/uL (ref 0.7–4.0)
MCH: 29.4 pg (ref 26.0–34.0)
MCHC: 29.9 g/dL — ABNORMAL LOW (ref 30.0–36.0)
MCV: 98.2 fL (ref 80.0–100.0)
Monocytes Absolute: 0.6 10*3/uL (ref 0.1–1.0)
Monocytes Relative: 10 %
NEUTROS ABS: 2.7 10*3/uL (ref 1.7–7.7)
Neutrophils Relative %: 46 %
PLATELETS: 143 10*3/uL — AB (ref 150–400)
RBC: 3.91 MIL/uL (ref 3.87–5.11)
RDW: 16.5 % — ABNORMAL HIGH (ref 11.5–15.5)
WBC: 5.9 10*3/uL (ref 4.0–10.5)
nRBC: 0 % (ref 0.0–0.2)

## 2018-06-20 LAB — TROPONIN I: Troponin I: 0.31 ng/mL (ref <0.03)

## 2018-06-20 LAB — RAPID URINE DRUG SCREEN, HOSP PERFORMED
Amphetamines: NOT DETECTED
Barbiturates: NOT DETECTED
Benzodiazepines: POSITIVE — AB
Cocaine: NOT DETECTED
Opiates: NOT DETECTED
Tetrahydrocannabinol: NOT DETECTED

## 2018-06-20 LAB — COMPREHENSIVE METABOLIC PANEL
ALT: 16 U/L (ref 0–44)
AST: 18 U/L (ref 15–41)
Albumin: 3.2 g/dL — ABNORMAL LOW (ref 3.5–5.0)
Alkaline Phosphatase: 56 U/L (ref 38–126)
Anion gap: 6 (ref 5–15)
BUN: 26 mg/dL — ABNORMAL HIGH (ref 8–23)
CO2: 32 mmol/L (ref 22–32)
Calcium: 9.9 mg/dL (ref 8.9–10.3)
Chloride: 106 mmol/L (ref 98–111)
Creatinine, Ser: 1.01 mg/dL — ABNORMAL HIGH (ref 0.44–1.00)
GFR calc non Af Amer: 55 mL/min — ABNORMAL LOW (ref >60)
Glucose, Bld: 107 mg/dL — ABNORMAL HIGH (ref 70–99)
Potassium: 4.4 mmol/L (ref 3.5–5.1)
Sodium: 144 mmol/L (ref 135–145)
Total Bilirubin: 0.4 mg/dL (ref 0.3–1.2)
Total Protein: 6.2 g/dL — ABNORMAL LOW (ref 6.5–8.1)

## 2018-06-20 LAB — URINALYSIS, ROUTINE W REFLEX MICROSCOPIC
Bilirubin Urine: NEGATIVE
Glucose, UA: NEGATIVE mg/dL
Hgb urine dipstick: NEGATIVE
Ketones, ur: NEGATIVE mg/dL
Leukocytes, UA: NEGATIVE
Nitrite: NEGATIVE
Protein, ur: NEGATIVE mg/dL
Specific Gravity, Urine: 1.01 (ref 1.005–1.030)
pH: 5 (ref 5.0–8.0)

## 2018-06-20 LAB — BLOOD GAS, ARTERIAL
Acid-Base Excess: 5.3 mmol/L — ABNORMAL HIGH (ref 0.0–2.0)
Bicarbonate: 30.2 mmol/L — ABNORMAL HIGH (ref 20.0–28.0)
Drawn by: 295031
FIO2: 21
O2 Saturation: 96.9 %
Patient temperature: 97.4
pCO2 arterial: 47.3 mmHg (ref 32.0–48.0)
pH, Arterial: 7.418 (ref 7.350–7.450)
pO2, Arterial: 74.5 mmHg — ABNORMAL LOW (ref 83.0–108.0)

## 2018-06-20 LAB — BRAIN NATRIURETIC PEPTIDE: B Natriuretic Peptide: 89.8 pg/mL (ref 0.0–100.0)

## 2018-06-20 LAB — LACTIC ACID, PLASMA
Lactic Acid, Venous: 1 mmol/L (ref 0.5–1.9)
Lactic Acid, Venous: 1.5 mmol/L (ref 0.5–1.9)
Lactic Acid, Venous: 1 mmol/L (ref 0.5–1.9)

## 2018-06-20 LAB — GLUCOSE, CAPILLARY
Glucose-Capillary: 135 mg/dL — ABNORMAL HIGH (ref 70–99)
Glucose-Capillary: 142 mg/dL — ABNORMAL HIGH (ref 70–99)

## 2018-06-20 LAB — MRSA PCR SCREENING: MRSA by PCR: NEGATIVE

## 2018-06-20 LAB — TSH: TSH: 4.132 u[IU]/mL (ref 0.350–4.500)

## 2018-06-20 MED ORDER — LACTATED RINGERS IV BOLUS
1000.0000 mL | Freq: Once | INTRAVENOUS | Status: AC
  Administered 2018-06-20: 1000 mL via INTRAVENOUS

## 2018-06-20 MED ORDER — NOREPINEPHRINE 4 MG/250ML-% IV SOLN
0.0000 ug/min | INTRAVENOUS | Status: DC
  Administered 2018-06-20: 2 ug/min via INTRAVENOUS
  Administered 2018-06-21: 5 ug/min via INTRAVENOUS
  Filled 2018-06-20 (×2): qty 250

## 2018-06-20 MED ORDER — SODIUM CHLORIDE 0.9 % IV SOLN
2.0000 g | Freq: Once | INTRAVENOUS | Status: AC
  Administered 2018-06-20: 2 g via INTRAVENOUS
  Filled 2018-06-20: qty 2

## 2018-06-20 MED ORDER — IOPAMIDOL (ISOVUE-370) INJECTION 76%
INTRAVENOUS | Status: AC
  Filled 2018-06-20: qty 100

## 2018-06-20 MED ORDER — SODIUM CHLORIDE 0.9 % IV SOLN
2.0000 g | Freq: Two times a day (BID) | INTRAVENOUS | Status: DC
  Administered 2018-06-21: 2 g via INTRAVENOUS
  Filled 2018-06-20: qty 2

## 2018-06-20 MED ORDER — VANCOMYCIN HCL IN DEXTROSE 1-5 GM/200ML-% IV SOLN: 1000.0000 mg | INTRAVENOUS | Status: DC

## 2018-06-20 MED ORDER — ATROPINE SULFATE 1 MG/10ML IJ SOSY
PREFILLED_SYRINGE | INTRAMUSCULAR | Status: AC
  Filled 2018-06-20: qty 10

## 2018-06-20 MED ORDER — VANCOMYCIN HCL 10 G IV SOLR
2000.0000 mg | Freq: Once | INTRAVENOUS | Status: DC
  Filled 2018-06-20: qty 2000

## 2018-06-20 MED ORDER — HEPARIN SODIUM (PORCINE) 5000 UNIT/ML IJ SOLN
4000.0000 [IU] | Freq: Once | INTRAMUSCULAR | Status: AC
  Administered 2018-06-20: 4000 [IU] via INTRAVENOUS
  Filled 2018-06-20: qty 4

## 2018-06-20 MED ORDER — HEPARIN (PORCINE) 25000 UT/250ML-% IV SOLN
950.0000 [IU]/h | INTRAVENOUS | Status: DC
  Administered 2018-06-20: 950 [IU]/h via INTRAVENOUS
  Filled 2018-06-20: qty 250

## 2018-06-20 MED ORDER — HYDROCORTISONE NA SUCCINATE PF 100 MG IJ SOLR
100.0000 mg | Freq: Once | INTRAMUSCULAR | Status: AC
  Administered 2018-06-20: 100 mg via INTRAVENOUS
  Filled 2018-06-20: qty 2

## 2018-06-20 MED ORDER — HEPARIN BOLUS VIA INFUSION
4000.0000 [IU] | Freq: Once | INTRAVENOUS | Status: DC
  Filled 2018-06-20: qty 4000

## 2018-06-20 MED ORDER — VANCOMYCIN HCL IN DEXTROSE 1-5 GM/200ML-% IV SOLN: 1000.0000 mg | Freq: Two times a day (BID) | INTRAVENOUS | Status: DC

## 2018-06-20 MED ORDER — METRONIDAZOLE IN NACL 5-0.79 MG/ML-% IV SOLN
500.0000 mg | Freq: Three times a day (TID) | INTRAVENOUS | Status: DC
  Administered 2018-06-20 – 2018-06-21 (×2): 500 mg via INTRAVENOUS
  Filled 2018-06-20 (×3): qty 100

## 2018-06-20 MED ORDER — HEPARIN SODIUM (PORCINE) 5000 UNIT/ML IJ SOLN
5000.0000 [IU] | Freq: Three times a day (TID) | INTRAMUSCULAR | Status: DC
  Administered 2018-06-20 – 2018-06-25 (×14): 5000 [IU] via SUBCUTANEOUS
  Filled 2018-06-20 (×14): qty 1

## 2018-06-20 MED ORDER — IOPAMIDOL (ISOVUE-370) INJECTION 76%
100.0000 mL | Freq: Once | INTRAVENOUS | Status: AC | PRN
  Administered 2018-06-20: 100 mL via INTRAVENOUS

## 2018-06-20 MED ORDER — METOPROLOL TARTRATE 5 MG/5ML IV SOLN
INTRAVENOUS | Status: AC
  Filled 2018-06-20: qty 5

## 2018-06-20 MED ORDER — ACETAMINOPHEN 325 MG PO TABS
650.0000 mg | ORAL_TABLET | Freq: Once | ORAL | Status: AC
  Administered 2018-06-20: 650 mg via ORAL
  Filled 2018-06-20: qty 2

## 2018-06-20 MED ORDER — SODIUM CHLORIDE (PF) 0.9 % IJ SOLN
INTRAMUSCULAR | Status: AC
  Filled 2018-06-20: qty 50

## 2018-06-20 MED ORDER — MORPHINE SULFATE (PF) 2 MG/ML IV SOLN
INTRAVENOUS | Status: AC
  Filled 2018-06-20: qty 1

## 2018-06-20 MED ORDER — VANCOMYCIN HCL 10 G IV SOLR: 1250.0000 mg | INTRAVENOUS | Status: DC

## 2018-06-20 MED ORDER — VANCOMYCIN HCL 10 G IV SOLR
2000.0000 mg | Freq: Once | INTRAVENOUS | Status: AC
  Administered 2018-06-20: 2000 mg via INTRAVENOUS
  Filled 2018-06-20 (×3): qty 2000

## 2018-06-20 NOTE — ED Notes (Signed)
To CT and returned-patient confused to time and place-able to state first name-unable to state last name-able to tell [facility] DOB. MP continues SB with external Zoll pacer pads in place. Levophed at 7.5 mcg/kg/min.

## 2018-06-20 NOTE — Progress Notes (Signed)
Pharmacy Antibiotic Note  Penny Carr is a 72 y.o. female admitted on 06/20/2018 with sepsis.  Pharmacy has been consulted for Vancomycin and Cefepime dosing.  Plan:  Vancomycin 2000 mg IV now, then 1000 mg IV q24 hr (est AUC 514 based on SCr 1.01)  Measure vancomycin AUC at steady state as indicated  Cefepime 2 g IV q12 hr  SCr q48 hr     Temp (24hrs), Avg:95.3 F (35.2 C), Min:93.7 F (34.3 C), Max:97.9 F (36.6 C)  Recent Labs  Lab 06/20/18 1336 06/20/18 1523 06/20/18 1711  WBC 5.9  --   --   CREATININE 1.01*  --   --   LATICACIDVEN  --  1.0 1.5    CrCl cannot be calculated (Unknown ideal weight.).    No Known Allergies   Thank you for allowing pharmacy to be a part of this patient's care.  Bernadene Personrew Judas Mohammad, PharmD, BCPS [phone] 06/20/2018, 7:08 PM

## 2018-06-20 NOTE — ED Notes (Signed)
Bed: WA04 Expected date:  Expected time:  Means of arrival:  Comments: Triage 

## 2018-06-20 NOTE — H&P (Addendum)
NAME:  Penny Carr, MRN:  [id number], DOB:  May 25, 1946, LOS: 0 ADMISSION DATE:  06/20/2018, CONSULTATION DATE:  06/20/2018  REFERRING MD:  Dr. Garen GramsMensner, CHIEF COMPLAINT:  Bradycardic, hypothermic    Brief History   Please see HPI  History of present illness   This is a 72 year old female that has a past medical history of dementia.  Currently lives in a facility.  I asked the nursing staff in the ED if there was any paperwork that was brought with her and she stated she did not think so.  The handoff from EMS to the emergency room stated that the patient was complaining of bilateral leg pain and she had wraps on her lower extremities that were removed which improved her pain.  However while in the emergency room the patient became more lethargic and hypothermic.  During this time she became bradycardic and hypotensive.  Of note her medication list included Depakote donepezil, haloperidol, Namenda.  Patient was given fluid bolus in the emergency room which improved her blood pressure.  Cardiology was consulted for mild elevation in troponin and the patient was started on heparin by the emergency room.  Seeing and examining the patient the patient is unable to give us any clear responses to questioning.  She will moan and tell you things that hurt but with no clear purpose.  When vigorously stimulated she will wake up and look at you but not answer questions.  Chest x-ray revealed central vascular congestion.  Past Medical History   Past Medical History:  Diagnosis Date  . Alzheimer disease (HCC)   . Diabetes mellitus without complication (HCC)      Significant Hospital Events   06/20/2018: ICU admission  Consults:  06/20/2018: Critical care  Procedures:   Significant Diagnostic Tests:  CT head, abdomen/pelvis, CTA chest-pending  Micro Data:  Blood cultures x2 pending Urine cultures-pending  Antimicrobials:  06/20/2018 cefepime 06/20/2018 metronidazole 06/20/2018:  Vancomycin  Interim history/subjective:  Unable to give due to critical illness.  Objective   Blood pressure 119/63, pulse (!) 39, temperature (!) 94.3 F (34.6 C), temperature source Oral, resp. rate 13, SpO2 98 %.        Intake/Output Summary (Last 24 hours) at 06/20/2018 1825 Last data filed at 06/20/2018 1825 Gross per 24 hour  Intake 494.56 ml  Output -  Net 494.56 ml   There were no vitals filed for this visit.  Examination: General: Elderly female, chronically ill-appearing, will respond to voice, some purposeful movements HENT: NCAT, sclera clear, pupils reactive, poor dentition, no nystagmus Lungs: Diminished breath sounds bilaterally, no obvious wheezing or crackles Cardiovascular: Bradycardic, regular, distant heart tones, morbidly obese chest, difficult for auscultation Abdomen: Only obese pannus, soft, bowel sounds present Extremities: Bilateral lower extremity edema, redness of the bilateral anterior shins does appear to have some pitting Neuro: We will withdrawal to pain, moves all 4 extremities spontaneously, will not follow basic commands.  I am unsure of her baseline but I do note that she has a history of advanced dementia.  Resolved Hospital Problem list    Assessment & Plan:   Hypotension, shock, bradycardia, hypothermia Upon review the medication list I would be concern for polypharmacy as the cause. She could very well have sepsis of unknown etiology.  Currently with no clear source. Chest x-ray is consistent with cardiomegaly, possible mild pulmonary vascular congestion.  No recent cardiac imaging or prior documented cardiac history. -I agree with checking TSH, T3, T4, thyroid labs pending, however neurologic exam  is not necessarily consistent with hypothyroidism -Agree with dose of 100 mg hydrocortisone -Patient did respond to fluid administration. -Will agreed to admit to the intensive care unit as we continue to figure out the etiology. -Will start  low-dose norepinephrine to maintain blood pressure greater than 65 mmHg if needed. -She needs to be covered with broad-spectrum antibiotics as well -Agree with cefepime and metronidazole, will initiate vancomycin -Appreciate pharmacy's input regarding anticoagulation. -CT head pending -CT chest abdomen pelvis - pending -We must obtain records from her referral facility. - check uds   Bradycardia, elevated troponin -No known prior cardiac disease history however would not anticoagulate at this time. - I believe that she needs to have a head CT first to ensure no intracranial pathology prior to heparinization. -I have discussed this with cardiology and will continue to trend her troponin.  Acute metabolic encephalopathy Likely secondary to above -We will continue to observe -Per nursing staff in the ER it does appear to be improving.  Best practice:  Diet: NPO  Pain/Anxiety/Delirium protocol (if indicated): NA VAP protocol (if indicated): NA DVT prophylaxis: Heparin sq GI prophylaxis: NA Glucose control: SSI  Mobility: Bedrest  Code Status: FULL  Family Communication: none Disposition:   Labs   CBC: Recent Labs  Lab 06/20/18 1336  WBC 5.9  NEUTROABS 2.7  HGB 11.5*  HCT 38.4  MCV 98.2  PLT 143*    Basic Metabolic Panel: Recent Labs  Lab 06/20/18 1336  NA 144  K 4.4  CL 106  CO2 32  GLUCOSE 107*  BUN 26*  CREATININE 1.01*  CALCIUM 9.9   GFR: CrCl cannot be calculated (Unknown ideal weight.). Recent Labs  Lab 06/20/18 1336 06/20/18 1523 06/20/18 1711  WBC 5.9  --   --   LATICACIDVEN  --  1.0 1.5    Liver Function Tests: Recent Labs  Lab 06/20/18 1336  AST 18  ALT 16  ALKPHOS 56  BILITOT 0.4  PROT 6.2*  ALBUMIN 3.2*   No results for input(s): LIPASE, AMYLASE in the last 168 hours. No results for input(s): AMMONIA in the last 168 hours.  ABG    Component Value Date/Time   PHART 7.418 06/20/2018 1800   PCO2ART 47.3 06/20/2018 1800    PO2ART 74.5 (L) 06/20/2018 1800   HCO3 30.2 (H) 06/20/2018 1800   O2SAT 96.9 06/20/2018 1800     Coagulation Profile: No results for input(s): INR, PROTIME in the last 168 hours.  Cardiac Enzymes: Recent Labs  Lab 06/20/18 1336  TROPONINI 0.31*    HbA1C: Hgb A1c MFr Bld  Date/Time Value Ref Range Status  10/18/2008 05:20 PM  4.6 - 6.1 % Final   5.9 (NOTE) The ADA recommends the following therapeutic goal for glycemic control related to Hgb A1c measurement: Goal of therapy: <6.5 Hgb A1c  Reference: American Diabetes Association: Clinical Practice Recommendations 2010, Diabetes Care, 2010, 33: (Suppl  1).    CBG: No results for input(s): GLUCAP in the last 168 hours.  Review of Systems:   Unable to obtain do to critical illness   Past Medical History  She,  has a past medical history of Alzheimer disease (HCC) and Diabetes mellitus without complication (HCC).   Surgical History   History reviewed. No pertinent surgical history.   Social History   reports that she has quit smoking. She does not have any smokeless tobacco history on file. She reports that she does not drink alcohol or use drugs.   Family History  Her family history is not on file.   Allergies No Known Allergies   Home Medications  Prior to Admission medications   Medication Sig Start Date End Date Taking? Authorizing Provider  acetaminophen (TYLENOL) 500 MG tablet Take 500 mg by mouth every 4 (four) hours as needed for mild pain.   Yes [provider]  acetaminophen (TYLENOL) 650 MG CR tablet Take 650 mg by mouth 2 (two) times daily.   Yes [provider]  alum & mag hydroxide-simeth (MINTOX) 200-200-20 MG/5ML suspension Take 30 mLs by mouth every 6 (six) hours as needed for indigestion or heartburn.   Yes [provider]  Cranberry 500 MG CAPS Take 500 mg by mouth 2 (two) times daily.   Yes [provider]  divalproex (DEPAKOTE SPRINKLE) 125 MG capsule Take 250 mg  by mouth 2 (two) times daily.   Yes [provider]  donepezil (ARICEPT) 10 MG tablet Take 10 mg by mouth at bedtime.   Yes [provider]  ergocalciferol (VITAMIN D2) 1.25 MG (50000 UT) capsule Take 50,000 Units by mouth once a week. On Saturday   Yes [provider]  escitalopram (LEXAPRO) 10 MG tablet Take 10 mg by mouth daily.   Yes [provider]  furosemide (LASIX) 40 MG tablet Take 40 mg by mouth every other day.   Yes [provider]  guaifenesin (ROBITUSSIN) 100 MG/5ML syrup Take 200 mg by mouth 4 (four) times daily as needed for cough.   Yes [provider]  haloperidol (HALDOL) 5 MG tablet Take 2.5 mg by mouth 2 (two) times daily as needed for agitation.   Yes [provider]  isosorbide mononitrate (IMDUR) 30 MG 24 hr tablet Take 30 mg by mouth daily.   Yes [provider]  lisinopril (PRINIVIL,ZESTRIL) 40 MG tablet Take 40 mg by mouth daily.   Yes [provider]  loperamide (IMODIUM) 2 MG capsule Take 2 mg by mouth as needed for diarrhea or loose stools.   Yes [provider]  LORazepam (ATIVAN) 0.5 MG tablet Take 0.5 mg by mouth 3 (three) times daily.    Yes [provider]  magnesium hydroxide (MILK OF MAGNESIA) 400 MG/5ML suspension Take 30 mLs by mouth at bedtime as needed for mild constipation.   Yes [provider]  memantine (NAMENDA XR) 28 MG CP24 24 hr capsule Take 28 mg by mouth daily.   Yes [provider]  Menthol-Zinc Oxide (CALMOSEPTINE) 0.44-20.6 % OINT Apply 1 application topically 2 (two) times daily as needed (rash/irritation).   Yes [provider]  neomycin-bacitracin-polymyxin (NEOSPORIN) ointment Apply 1 application topically as needed for wound care.    Yes [provider]  nystatin (NYSTATIN) powder Apply 1 g topically 2 (two) times daily as needed (candidiasis rash).   Yes [provider]  OLANZapine (ZYPREXA) 2.5 MG  tablet Take 2.5 mg by mouth daily.   Yes [provider]  potassium chloride SA (K-DUR,KLOR-CON) 20 MEQ tablet Take 20 mEq by mouth daily.   Yes [provider]  simvastatin (ZOCOR) 40 MG tablet Take 40 mg by mouth daily.   Yes [provider]  acetaminophen (TYLENOL) 325 MG tablet Take 2 tablets (650 mg total) by mouth every 6 (six) hours as needed. Patient not taking: Reported on 06/20/2018 10/11/13   Gerhard Munch, MD    This patient is critically ill with multiple organ system failure; which, requires frequent high complexity decision making, assessment, support, evaluation, and titration  of therapies. This was completed through the application of advanced monitoring technologies and extensive interpretation of multiple databases. During this encounter critical care time was devoted to patient care services described in this note for 48 minutes.   [name], DO [facility] 06/20/2018 6:26 PM  Personal pager: [phone] If unanswered, please page CCM On-call: #[phone]

## 2018-06-20 NOTE — Progress Notes (Signed)
ANTICOAGULATION CONSULT NOTE  Pharmacy Consult for IV heparin Indication: chest pain/ACS  No Known Allergies  Patient Measurements:   Heparin Dosing Weight: 79 kg  Vital Signs: Temp: 97.9 F (36.6 C) (12/30 0937) Temp Source: Oral (12/30 0937) BP: 91/46 (12/30 1530) Pulse Rate: 40 (12/30 1530)  Labs: Recent Labs    06/20/18 1336  HGB 11.5*  HCT 38.4  PLT 143*  CREATININE 1.01*  TROPONINI 0.31*    CrCl cannot be calculated (Unknown ideal weight.).   Medical History: Past Medical History:  Diagnosis Date  . Alzheimer disease (HCC)   . Diabetes mellitus without complication (HCC)     Medications:  (Not in a hospital admission)  Scheduled:   PRN:  06/20/2018,3:45 PM  Assessment: [age] yoF presenting to ED for leg pain; PMH dementia. While in ED, troponins noted to be positive at 0.31 and HR dropping into 30's. Pharmacy consulted to dose heparin for ACS   Baseline INR, aPTT: not done  Prior anticoagulation: none  Significant events:  Today, 06/20/2018:  CBC: Hbg and Plt both slightly low  No bleeding or infusion issues per nursing  Goal of Therapy: Heparin level 0.3-0.7 units/ml Monitor platelets by anticoagulation protocol: Yes  Plan:  Heparin 4000 units IV bolus x 1  Heparin 950 units/hr IV infusion  Check heparin level 8 hrs after start  Daily CBC, daily heparin level once stable  Monitor for signs of bleeding or thrombosis   Bernadene Personrew Becca Bayne, PharmD, BCPS [phone] 06/20/2018, 3:46 PM

## 2018-06-20 NOTE — ED Notes (Signed)
MD Mesner made aware of 2 consecutive low BPs.

## 2018-06-20 NOTE — ED Notes (Signed)
Bair hugger warmer initiated.

## 2018-06-20 NOTE — Consult Note (Signed)
Cardiology Consultation:   Patient ID: Penny Carr MRN: [id number]; DOB: 1945-08-23  Admit date: 06/20/2018 Date of Consult: 06/20/2018  Primary Care Provider: Samuel Jester, DO Primary Cardiologist: Ellis Parents [name])  Patient Profile:   ARAH ARO is a 72 y.o. female with a hx of hypertension, dementia who is being seen today for the evaluation of bradycardia and hypotension at the request of Dr. Clayborne Dana.  History of Present Illness:   [name] is unable to give any history, and she has limited information available in the chart. She was brought in from a nursing home, no history available. No family able to be reached yet for history.  She was initially brought in this AM from [facility] nursing facility with a chief complaint of leg pain. Vitals on arrival noted as BP 124/80, HR 65. However, she was noted to be in sinus bradycardia/sinus arrhythmia. Per ER MD, when she slept, her heart rate would dip to the 30s-40s, and her blood pressure would drop as well.   She did open her eyes and comment when she was having IV placed, but otherwise not able to comment on concerns/complaints. She did yell when her legs were examined.  Past Medical History:  Diagnosis Date  . Alzheimer disease (HCC)   . Diabetes mellitus without complication (HCC)     Home Medications:  Prior to Admission medications   Medication Sig Start Date End Date Taking? Authorizing Provider  acetaminophen (TYLENOL) 500 MG tablet Take 500 mg by mouth every 4 (four) hours as needed for mild pain.   Yes [provider]  acetaminophen (TYLENOL) 650 MG CR tablet Take 650 mg by mouth 2 (two) times daily.   Yes [provider]  alum & mag hydroxide-simeth (MINTOX) 200-200-20 MG/5ML suspension Take 30 mLs by mouth every 6 (six) hours as needed for indigestion or heartburn.   Yes [provider]  Cranberry 500 MG CAPS Take 500 mg by mouth 2 (two) times daily.   Yes  [provider]  divalproex (DEPAKOTE SPRINKLE) 125 MG capsule Take 250 mg by mouth 2 (two) times daily.   Yes [provider]  donepezil (ARICEPT) 10 MG tablet Take 10 mg by mouth at bedtime.   Yes [provider]  ergocalciferol (VITAMIN D2) 1.25 MG (50000 UT) capsule Take 50,000 Units by mouth once a week. On Saturday   Yes [provider]  escitalopram (LEXAPRO) 10 MG tablet Take 10 mg by mouth daily.   Yes [provider]  furosemide (LASIX) 40 MG tablet Take 40 mg by mouth every other day.   Yes [provider]  guaifenesin (ROBITUSSIN) 100 MG/5ML syrup Take 200 mg by mouth 4 (four) times daily as needed for cough.   Yes [provider]  haloperidol (HALDOL) 5 MG tablet Take 2.5 mg by mouth 2 (two) times daily as needed for agitation.   Yes [provider]  isosorbide mononitrate (IMDUR) 30 MG 24 hr tablet Take 30 mg by mouth daily.   Yes [provider]  lisinopril (PRINIVIL,ZESTRIL) 40 MG tablet Take 40 mg by mouth daily.   Yes [provider]  loperamide (IMODIUM) 2 MG capsule Take 2 mg by mouth as needed for diarrhea or loose stools.   Yes [provider]  LORazepam (ATIVAN) 0.5 MG tablet Take 0.5 mg by mouth 3 (three) times daily.    Yes [provider]  magnesium hydroxide (MILK OF MAGNESIA) 400 MG/5ML suspension Take 30 mLs by mouth  at bedtime as needed for mild constipation.   Yes [provider]  memantine (NAMENDA XR) 28 MG CP24 24 hr capsule Take 28 mg by mouth daily.   Yes [provider]  Menthol-Zinc Oxide (CALMOSEPTINE) 0.44-20.6 % OINT Apply 1 application topically 2 (two) times daily as needed (rash/irritation).   Yes [provider]  neomycin-bacitracin-polymyxin (NEOSPORIN) ointment Apply 1 application topically as needed for wound care.    Yes [provider]  nystatin (NYSTATIN) powder Apply 1 g topically 2 (two) times daily as  needed (candidiasis rash).   Yes [provider]  OLANZapine (ZYPREXA) 2.5 MG tablet Take 2.5 mg by mouth daily.   Yes [provider]  potassium chloride SA (K-DUR,KLOR-CON) 20 MEQ tablet Take 20 mEq by mouth daily.   Yes [provider]  simvastatin (ZOCOR) 40 MG tablet Take 40 mg by mouth daily.   Yes [provider]  acetaminophen (TYLENOL) 325 MG tablet Take 2 tablets (650 mg total) by mouth every 6 (six) hours as needed. Patient not taking: Reported on 06/20/2018 10/11/13   Gerhard MunchLockwood, Robert, MD    Inpatient Medications: Scheduled Meds: . hydrocortisone sodium succinate  100 mg Intravenous Once  . iopamidol      . sodium chloride (PF)       Continuous Infusions: . metronidazole    . norepinephrine (LEVOPHED) Adult infusion 2 mcg/min (06/20/18 1810)   PRN Meds: iopamidol  Allergies:   No Known Allergies  Social History:   Social History   Socioeconomic History  . Marital status: Widowed    Spouse name: Not on file  . Number of children: Not on file  . Years of education: Not on file  . Highest education level: Not on file  Occupational History  . Not on file  Social Needs  . Financial resource strain: Not on file  . Food insecurity:    Worry: Not on file    Inability: Not on file  . Transportation needs:    Medical: Not on file    Non-medical: Not on file  Tobacco Use  . Smoking status: Former Smoker  Substance and Sexual Activity  . Alcohol use: No  . Drug use: No  . Sexual activity: Never  Lifestyle  . Physical activity:    Days per week: Not on file    Minutes per session: Not on file  . Stress: Not on file  Relationships  . Social connections:    Talks on phone: Not on file    Gets together: Not on file    Attends religious service: Not on file    Active member of club or organization: Not on file    Attends meetings of clubs or organizations: Not on file    Relationship status: Not on file  . Intimate partner  violence:    Fear of current or ex partner: Not on file    Emotionally abused: Not on file    Physically abused: Not on file    Forced sexual activity: Not on file  Other Topics Concern  . Not on file  Social History Narrative  . Not on file    Family History:   Unable to obtain due to patient's status/dementia  ROS:  Unable to obtain due to patient's status/dementia   Physical Exam/Data:   Vitals:   06/20/18 1806 06/20/18 1810 06/20/18 1814 06/20/18 1816  BP: (!) 90/56 (!) 119/51 (!) 133/111 (!) 151/139  Pulse: (!) 55 (!) 45 (!) 53 (!) 41  Resp: 17 14 14 11   Temp:      TempSrc:      SpO2: 98% 96% 98% 99%    Intake/Output Summary (Last 24 hours) at 06/20/2018 1822 Last data filed at 06/20/2018 1556 Gross per 24 hour  Intake 471.04 ml  Output -  Net 471.04 ml   General:  Obese, resting in the bed, responds to provocation but otherwise unable to give any history HEENT: [place] in place Neck: no JVD appreciated, difficult body habitus Vascular: No carotid bruits; RA pulses 2+ bilaterally, unable to palpate DP or PT pulses due to edema and patient discomfort Cardiac:  normal S1, S2; RRR; no murmur Lungs:  clear to auscultation anterior and laterally Abd: soft, nontender, no hepatomegaly  Ext: bilateral tense LE edema with weeping blisters, pink tone across anterior shins. Musculoskeletal:  Moves all 4 limbs independently Skin: warm and dry  Neuro:  Nonfocal, does not follow commands Psych:  Unable to assess/does not respond to questions appropriately  EKG:  The EKG was personally reviewed and demonstrates:  Sinus bradycardia Telemetry:  Telemetry was personally reviewed and demonstrates:  Sinus bradycardia/sinus arrhythmia with rare PVC, average rates in the 40s-50s  Relevant CV Studies: None recent  Laboratory Data:  Chemistry Recent Labs  Lab 06/20/18 1336  NA 144  K 4.4  CL 106  CO2 32  GLUCOSE 107*  BUN 26*  CREATININE 1.01*  CALCIUM 9.9  GFRNONAA 55*    GFRAA >60  ANIONGAP 6    Recent Labs  Lab 06/20/18 1336  PROT 6.2*  ALBUMIN 3.2*  AST 18  ALT 16  ALKPHOS 56  BILITOT 0.4   Hematology Recent Labs  Lab 06/20/18 1336  WBC 5.9  RBC 3.91  HGB 11.5*  HCT 38.4  MCV 98.2  MCH 29.4  MCHC 29.9*  RDW 16.5*  PLT 143*   Cardiac Enzymes Recent Labs  Lab 06/20/18 1336  TROPONINI 0.31*   No results for input(s): TROPIPOC in the last 168 hours.  BNP Recent Labs  Lab 06/20/18 1336  BNP 89.8    DDimer No results for input(s): DDIMER in the last 168 hours.  Radiology/Studies:  Dg Chest Portable 1 View  Result Date: 06/20/2018 CLINICAL DATA:  Altered mental status EXAM: PORTABLE CHEST 1 VIEW COMPARISON:  05/25/2018, 03/26/2010 FINDINGS: Low lung volumes. Enlarged cardiomediastinal silhouette with mild central vascular congestion. No acute consolidation. No pneumothorax. IMPRESSION: Low lung volumes.  Cardiomegaly with central vascular congestion. Electronically Signed   By: Jasmine Pang M.D.   On: 06/20/2018 16:01    Assessment and Plan:   Bradycardia, hypotension In the setting of sepsis, hypotension, bradycardia is inappropriate. However, per med list, she was on olanzapine, memantine, haloperidol PRN, escitalopram, donepezil, and divalproex and lisinopril and imdur for BP. Her QTc by manual calculation appears closer to 440 ms.  The first step I recommend is to hold all meds and monitor for response. I also agree with hypothyroidism workup given bradycardia, LE edema, hypotension and hypothermia.   She has responded well to IV fluids and vasopressors. Appreciate pulm/critical care management. Her legs may be chronic, but they could also be source for sepsis. Being pan cultured. Concern for underlying cellulitis vs. Myxedema vs. DVT. She is pending pan scan for further evaluation.  Elevated troponin: No complaints of chest pain, no acute ECG changes. May be demand 2/2 sepsis. Given her baseline mental status, would prefer  not to treat with heparin at this time as it will be difficult to  assess if she has mental status changes. If she develops chest pain or ECG changes, would then treat as NSTEMI. Would consider head CT prior to heparin if it needs to be restarted.  Very complex case. We will continue to follow.  For questions or updates, please contact [facility] Please consult [email or web address] for contact info under   Signed, Jodelle RedBridgette Ytzel Gubler, MD  06/20/2018 6:22 PM

## 2018-06-20 NOTE — ED Provider Notes (Signed)
Emergency Department Provider Note   I have reviewed the triage vital signs and the nursing notes.   HISTORY  Chief Complaint Leg Pain   HPI WRENLEY SAYED is a 72 y.o. female with significant dementia and diabetes who presents the emerge department for reported leg pain.  Not able to garner any other history from the patient as she is severe dementia and just says yes to everything asked. No other associated or modifying symptoms.   Level V Caveat Secondary to dementia/confusion?  Past Medical History:  Diagnosis Date  . Alzheimer disease (HCC)   . Diabetes mellitus without complication [facility])     Patient Active Problem List   Diagnosis Date Noted  . Shock (HCC) 06/20/2018  . DIVERTICULITIS, HX OF 03/26/2010    History reviewed. No pertinent surgical history.    Allergies Patient has no known allergies.  History reviewed. No pertinent family history.  Social History Social History   Tobacco Use  . Smoking status: Former Smoker  Substance Use Topics  . Alcohol use: No  . Drug use: No    Review of Systems  Level V Caveat 2/2 dementia/confusion. ____________________________________________   PHYSICAL EXAM:  VITAL SIGNS: ED Triage Vitals  Enc Vitals Group     BP 06/20/18 0937 (!) 143/78     Pulse Rate 06/20/18 0937 72     Resp 06/20/18 0937 18     Temp 06/20/18 0937 97.9 F (36.6 C)     Temp Source 06/20/18 0937 Oral     SpO2 06/20/18 0937 98 %    Constitutional: Alert to voice. Well appearing and in no acute distress. Eyes: Conjunctivae are normal. PERRL. EOMI. Head: Atraumatic. Nose: No congestion/rhinnorhea. Mouth/Throat: Mucous membranes are moist.  Oropharynx non-erythematous. Neck: No stridor.  No meningeal signs.   Cardiovascular: Normal rate, regular rhythm. Good peripheral circulation. Grossly normal heart sounds.   Respiratory: Normal respiratory effort.  No retractions. Lungs CTAB. Gastrointestinal: Soft and nontender. No  distention.  Musculoskeletal: No lower extremity tenderness with significant edema bilaterally. No gross deformities of extremities. Neurologic:  Normal speech (only says yes). Moves all extremities. Pupils equal, round and reactive. EOMI Skin:  Skin is warm, dry and intact. Bilateral pink rash to shins.    ____________________________________________   LABS (all labs ordered are listed, but only abnormal results are displayed)  Labs Reviewed  CBC WITH DIFFERENTIAL/PLATELET - Abnormal; Notable for the following components:      Result Value   Hemoglobin 11.5 (*)    MCHC 29.9 (*)    RDW 16.5 (*)    Platelets 143 (*)    All other components within normal limits  COMPREHENSIVE METABOLIC PANEL - Abnormal; Notable for the following components:   Glucose, Bld 107 (*)    BUN 26 (*)    Creatinine, Ser 1.01 (*)    Total Protein 6.2 (*)    Albumin 3.2 (*)    GFR calc non Af Amer 55 (*)    All other components within normal limits  TROPONIN I - Abnormal; Notable for the following components:   Troponin I 0.31 (*)    All other components within normal limits  BLOOD GAS, ARTERIAL - Abnormal; Notable for the following components:   pO2, Arterial 74.5 (*)    Bicarbonate 30.2 (*)    Acid-Base Excess 5.3 (*)    All other components within normal limits  URINALYSIS, ROUTINE W REFLEX MICROSCOPIC - Abnormal; Notable for the following components:   Color, Urine STRAW (*)  All other components within normal limits  RAPID URINE DRUG SCREEN, HOSP PERFORMED - Abnormal; Notable for the following components:   Benzodiazepines POSITIVE (*)    All other components within normal limits  GLUCOSE, CAPILLARY - Abnormal; Notable for the following components:   Glucose-Capillary 135 (*)    All other components within normal limits  TROPONIN I - Abnormal; Notable for the following components:   Troponin I 0.20 (*)    All other components within normal limits  GLUCOSE, CAPILLARY - Abnormal; Notable for  the following components:   Glucose-Capillary 142 (*)    All other components within normal limits  GLUCOSE, CAPILLARY - Abnormal; Notable for the following components:   Glucose-Capillary 147 (*)    All other components within normal limits  CULTURE, BLOOD (ROUTINE X 2)  CULTURE, BLOOD (ROUTINE X 2)  MRSA PCR SCREENING  CULTURE, BLOOD (ROUTINE X 2)  CULTURE, BLOOD (ROUTINE X 2)  URINE CULTURE  CULTURE, RESPIRATORY  BRAIN NATRIURETIC PEPTIDE  LACTIC ACID, PLASMA  TSH  T4  T3  LACTIC ACID, PLASMA  LACTIC ACID, PLASMA  CBC  BASIC METABOLIC PANEL  MAGNESIUM  PHOSPHORUS  TROPONIN I  TROPONIN I   ____________________________________________  EKG   EKG Interpretation  Date/Time:  Monday June 20 2018 17:46:42 EST Ventricular Rate:  50 PR Interval:    QRS Duration: 101 QT Interval:  504 QTC Calculation: 460 R Axis:   62 Text Interpretation:  Sinus rhythm Low voltage, precordial leads Nonspecific T abnormalities, lateral leads Confirmed by Tilden Fossaees, Elizabeth [phone]([id number]) on 06/20/2018 6:28:49 PM       ____________________________________________  RADIOLOGY  Dg Chest Portable 1 View  Result Date: 06/20/2018 CLINICAL DATA:  Altered mental status EXAM: PORTABLE CHEST 1 VIEW COMPARISON:  05/25/2018, 03/26/2010 FINDINGS: Low lung volumes. Enlarged cardiomediastinal silhouette with mild central vascular congestion. No acute consolidation. No pneumothorax. IMPRESSION: Low lung volumes.  Cardiomegaly with central vascular congestion. Electronically Signed   By: Jasmine PangKim  Fujinaga M.D.   On: 06/20/2018 16:01    ____________________________________________   PROCEDURES  Procedure(s) performed:   Procedures  CRITICAL CARE Performed by: Marily MemosJason Achillies Buehl Total critical care time: 35 minutes Critical care time was exclusive of separately billable procedures and treating other patients. Critical care was necessary to treat or prevent imminent or life-threatening  deterioration. Critical care was time spent personally by me on the following activities: development of treatment plan with patient and/or surrogate as well as nursing, discussions with consultants, evaluation of patient's response to treatment, examination of patient, obtaining history from patient or surrogate, ordering and performing treatments and interventions, ordering and review of laboratory studies, ordering and review of radiographic studies, pulse oximetry and re-evaluation of patient's condition.  ____________________________________________   INITIAL IMPRESSION / ASSESSMENT AND PLAN / ED COURSE  Initial here with reported bilateral lower leg pain.  Evaluation with evidence of lower edema however concern for possible pulmonary edema and the patient's inability to give me a history so I attempted to call his son to see if there was any extra history could provide but no answer so did a troponin, BNP, CBC, BMP and an EKG.  Revealed an elevated troponin.  On reevaluation approximately 2 hours after being in the emergency room the patient's blood pressure and heart rate both dropped.  Her blood pressure was in the 60s and 70s with a heart rate in the 30s.  When she would wake up both of these would improve to heart rate in the 50s and 60s with  systolic blood pressures in the 100s.  Her mental status was the same.  Lactic acid did not show any abnormalities.  Her temperature also dropped from 97.8-93.7 rectally.  Concern for possible myxedema coma so TSH and thyroid hormone labs were added on and  Was added.  She was put on Zoll pads.  Discussed the case with cardiology secondary to her elevated troponin in the setting of hypotension and bradycardia and they agreed to see her would not see any acute interventions at this time.  Reevaluation with more of a focus on possible infectious causes and legs do not look like cellulitis that look more like peripheral edema with venous stasis changes.  Lungs  are still clear and x-ray does not show any pneumonia.  Once again lactic acid is fine, white counts fine.  Will add on urinalysis and discussed with critical care for admission.  Pertinent labs & imaging results that were available during my care of the patient were reviewed by me and considered in my medical decision making (see chart for details).  ____________________________________________  FINAL CLINICAL IMPRESSION(S) / ED DIAGNOSES  Final diagnoses:  Hypotension, unspecified hypotension type     MEDICATIONS GIVEN DURING THIS VISIT:  Medications  metroNIDAZOLE (FLAGYL) IVPB 500 mg (500 mg Intravenous New Bag/Given 06/21/18 0136)  norepinephrine (LEVOPHED) 4mg  in D5W 250mL premix infusion (5 mcg/min Intravenous New Bag/Given 06/21/18 0036)  sodium chloride (PF) 0.9 % injection (has no administration in time range)  iopamidol (ISOVUE-370) 76 % injection (has no administration in time range)  heparin injection 5,000 Units (5,000 Units Subcutaneous Given 06/21/18 0539)  ceFEPIme (MAXIPIME) 2 g in sodium chloride 0.9 % 100 mL IVPB (2 g Intravenous New Bag/Given 06/21/18 0615)  vancomycin (VANCOCIN) 2,000 mg in sodium chloride 0.9 % 500 mL IVPB ( Intravenous Rate/Dose Verify 06/20/18 2100)    Followed by  vancomycin (VANCOCIN) IVPB 1000 mg/200 mL premix (has no administration in time range)  iopamidol (ISOVUE-370) 76 % injection (has no administration in time range)  sodium chloride (PF) 0.9 % injection (has no administration in time range)  atropine 1 MG/10ML injection (has no administration in time range)  MEDLINE mouth rinse (15 mLs Mouth Rinse Given 06/21/18 0136)  acetaminophen (TYLENOL) tablet 650 mg (650 mg Oral Given 06/20/18 1340)  lactated ringers bolus 1,000 mL (0 mLs Intravenous Stopped 06/20/18 1627)  heparin injection 4,000 Units (4,000 Units Intravenous Given 06/20/18 1524)  ceFEPIme (MAXIPIME) 2 g in sodium chloride 0.9 % 100 mL IVPB (0 g Intravenous Stopped 06/20/18  1908)  iopamidol (ISOVUE-370) 76 % injection 100 mL (100 mLs Intravenous Contrast Given 06/20/18 2009)  hydrocortisone sodium succinate (SOLU-CORTEF) 100 MG injection 100 mg (100 mg Intravenous Given 06/20/18 1835)  lactated ringers bolus 1,000 mL (1,000 mLs Intravenous New Bag/Given 06/20/18 2222)     NEW OUTPATIENT MEDICATIONS STARTED DURING THIS VISIT:  Current Discharge Medication List      Note:  This note was prepared with assistance of Dragon voice recognition software. Occasional wrong-word or sound-a-like substitutions may have occurred due to the inherent limitations of voice recognition software.   Marily MemosMesner, Kelsei Defino, MD 06/21/18 [id number]

## 2018-06-20 NOTE — ED Notes (Signed)
[facility] at bedside doing assessment and preparing patient for transport.

## 2018-06-20 NOTE — ED Notes (Signed)
[facility] notified of need for transport 

## 2018-06-20 NOTE — ED Notes (Signed)
X ray in room.

## 2018-06-20 NOTE — ED Notes (Signed)
Pt pressure elevates when she is awake, but drops when she falls asleep. MD aware of fluctuations.

## 2018-06-20 NOTE — ED Triage Notes (Signed)
Per EMS[facility]. Pt had dressing on left leg that EMS removed, which she states alleviated the pain. Pt has hx of dementia.  BP 124/80 HR 65 RR 18 SpO2 98% on RA

## 2018-06-20 NOTE — ED Provider Notes (Signed)
Patient care assumed at 1600. Patient with transient hypotension and bradycardia, awaiting admission to ICU. She developed recurrent hypotension, treated with IV fluid bolus, started on norepinephrine. Treating with antibiotics for possible sepsis pending further workup. Will DC heparin drip per ICU recommendation. Plan to obtain CT head for evaluation of altered mental status, CT PE study due to hypertension and CT abdomen due to abdominal tenderness on examination.   Tilden Fossaees, Izea Livolsi, MD 06/20/18 2324

## 2018-06-20 NOTE — ED Notes (Signed)
Date and time results received: 06/20/18 2:30 PM   Test: troponin Critical Value: 0.31  Name of Provider Notified: Mesner MD  Orders Received? Or Actions Taken?: acknowledges result

## 2018-06-20 NOTE — ED Notes (Signed)
Bed: RESA Expected date:  Expected time:  Means of arrival:  Comments: Room 4

## 2018-06-20 NOTE — Progress Notes (Signed)
A consult was received from an ED physician for Cefepime per pharmacy dosing (for an indication other than meningitis). The patient's profile has been reviewed for ht/wt/allergies/indication/available labs. A one time order has been placed for the above antibiotics.  Further antibiotics/pharmacy consults should be ordered by admitting physician if indicated.                       Bernadene Personrew Mykah Shin, PharmD, BCPS [phone] 06/20/2018, 5:36 PM

## 2018-06-21 ENCOUNTER — Other Ambulatory Visit: Payer: Self-pay

## 2018-06-21 ENCOUNTER — Inpatient Hospital Stay (HOSPITAL_COMMUNITY): Payer: Medicare Other

## 2018-06-21 ENCOUNTER — Encounter (HOSPITAL_COMMUNITY): Payer: Self-pay | Admitting: Cardiology

## 2018-06-21 DIAGNOSIS — I9589 Other hypotension: Secondary | ICD-10-CM

## 2018-06-21 DIAGNOSIS — I959 Hypotension, unspecified: Secondary | ICD-10-CM

## 2018-06-21 DIAGNOSIS — L03119 Cellulitis of unspecified part of limb: Secondary | ICD-10-CM

## 2018-06-21 LAB — GLUCOSE, CAPILLARY
GLUCOSE-CAPILLARY: 147 mg/dL — AB (ref 70–99)
GLUCOSE-CAPILLARY: 107 mg/dL — AB (ref 70–99)
Glucose-Capillary: 79 mg/dL (ref 70–99)
Glucose-Capillary: 106 mg/dL — ABNORMAL HIGH (ref 70–99)
Glucose-Capillary: 124 mg/dL — ABNORMAL HIGH (ref 70–99)

## 2018-06-21 LAB — ECHOCARDIOGRAM COMPLETE: WEIGHTICAEL: 4112.9 [oz_av]

## 2018-06-21 LAB — TROPONIN I
TROPONIN I: 0.2 ng/mL — AB (ref <0.03)
Troponin I: 0.15 ng/mL (ref <0.03)
Troponin I: 0.17 ng/mL (ref <0.03)

## 2018-06-21 LAB — T4: T4, Total: 6 ug/dL (ref 4.5–12.0)

## 2018-06-21 LAB — T3: T3, Total: 101 ng/dL (ref 71–180)

## 2018-06-21 MED ORDER — ESCITALOPRAM OXALATE 10 MG PO TABS
10.0000 mg | ORAL_TABLET | Freq: Every day | ORAL | Status: DC
  Administered 2018-06-21 – 2018-06-25 (×5): 10 mg via ORAL
  Filled 2018-06-21 (×5): qty 1

## 2018-06-21 MED ORDER — INSULIN ASPART 100 UNIT/ML ~~LOC~~ SOLN: 0.0000 [IU] | Freq: Every day | SUBCUTANEOUS | Status: DC

## 2018-06-21 MED ORDER — ORAL CARE MOUTH RINSE
15.0000 mL | Freq: Two times a day (BID) | OROMUCOSAL | Status: DC
  Administered 2018-06-21 – 2018-06-25 (×7): 15 mL via OROMUCOSAL

## 2018-06-21 MED ORDER — DONEPEZIL HCL 10 MG PO TABS
10.0000 mg | ORAL_TABLET | Freq: Every day | ORAL | Status: DC
  Administered 2018-06-21 – 2018-06-22 (×2): 10 mg via ORAL
  Filled 2018-06-21 (×2): qty 1

## 2018-06-21 MED ORDER — INSULIN ASPART 100 UNIT/ML ~~LOC~~ SOLN
0.0000 [IU] | Freq: Three times a day (TID) | SUBCUTANEOUS | Status: DC
  Administered 2018-06-22 – 2018-06-24 (×2): 2 [IU] via SUBCUTANEOUS

## 2018-06-21 MED ORDER — MEMANTINE HCL ER 28 MG PO CP24
28.0000 mg | ORAL_CAPSULE | Freq: Every day | ORAL | Status: DC
  Administered 2018-06-21 – 2018-06-25 (×5): 28 mg via ORAL
  Filled 2018-06-21 (×6): qty 1

## 2018-06-21 MED ORDER — DOXYCYCLINE HYCLATE 100 MG PO TABS
100.0000 mg | ORAL_TABLET | Freq: Two times a day (BID) | ORAL | Status: DC
  Administered 2018-06-21 – 2018-06-25 (×9): 100 mg via ORAL
  Filled 2018-06-21 (×9): qty 1

## 2018-06-21 MED ORDER — DIVALPROEX SODIUM 125 MG PO CSDR
250.0000 mg | DELAYED_RELEASE_CAPSULE | Freq: Two times a day (BID) | ORAL | Status: DC
  Administered 2018-06-21 – 2018-06-25 (×9): 250 mg via ORAL
  Filled 2018-06-21 (×9): qty 2

## 2018-06-21 MED ORDER — OLANZAPINE 5 MG PO TBDP
5.0000 mg | ORAL_TABLET | Freq: Once | ORAL | Status: AC
  Administered 2018-06-21: 5 mg via ORAL
  Filled 2018-06-21: qty 1

## 2018-06-21 MED ORDER — OLANZAPINE 2.5 MG PO TABS
2.5000 mg | ORAL_TABLET | Freq: Every day | ORAL | Status: DC
  Administered 2018-06-21 – 2018-06-25 (×4): 2.5 mg via ORAL
  Filled 2018-06-21 (×5): qty 1

## 2018-06-21 NOTE — Progress Notes (Signed)
Rn walked in pt room to do rounds and pt had ripped IV out. Rn consulted IV team

## 2018-06-21 NOTE — Progress Notes (Signed)
Pt keeps pulling tele box off and completely taking it apart. Pt will not keep it on for more then 5 minutes. Paged Md. Awaiting orders. Will continue to monitor.

## 2018-06-21 NOTE — Progress Notes (Signed)
[facility]Link Physician-Brief Progress Note Patient Name: Penny Carr DOB: 08-Jan-1946 MRN: [id number]   Date of Service  06/21/2018  HPI/Events of Note  Delirium - Pulled out IV and pulled herself off telemetry.   eICU Interventions  Will order: 1. Zyprexa zydis 5 mg SL now.  2. Tele-sitter.      Intervention Category Major Interventions: Delirium, psychosis, severe agitation - evaluation and management  Bradley Handyside Eugene 06/21/2018, 10:52 PM

## 2018-06-21 NOTE — Progress Notes (Signed)
CRITICAL VALUE ALERT  Critical Value: troponin 0.20  Date & Time Notied:  06/21/18 0031  Provider Notified: E-link MD  Orders Received/Actions taken: awaiting orders

## 2018-06-21 NOTE — Progress Notes (Signed)
Writer called to place PIV per consult. Patient pulled previous PIV out . Patient educated on procedure, and initially was agreeable. Patient assisted with extremity placement for PIV. Writer attempted to place PIV and patient grabbed writers wrist and attempted to take PIV catheter with exposed needle from this RN's hand. Procedure aborted. Primary care RN updated. Per primary care RN, patient confused. Instructed RN to consult VAST with plan in place for safe PIV placement.

## 2018-06-21 NOTE — Progress Notes (Signed)
Md ordered another dose of Zyprexa, continue with tele. And put the IV back in when the Zyprexa is given and the patient has time to settle down.

## 2018-06-21 NOTE — Progress Notes (Signed)
NAME:  Penny Carr, MRN:  [id number], DOB:  15-Sep-1945, LOS: 1 ADMISSION DATE:  06/20/2018, CONSULTATION DATE:  06/21/2018  REFERRING MD:  Dr. Garen GramsMensner, CHIEF COMPLAINT:  Bradycardic, hypothermic    Brief History   72 y/o female admitted with hypothermia, bradycardia, hypotension.   Past Medical History   Past Medical History:  Diagnosis Date  . Alzheimer disease (HCC)   . 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 rate 15, weight 116.6 kg, SpO2 90 %.        Intake/Output Summary (Last 24 hours) at 06/21/2018 0909 Last data filed at 06/21/2018 0800 Gross per 24 hour  Intake 2178.93 ml  Output 2445 ml  Net -266.07 ml   Filed Weights   06/21/18 0500  Weight: 116.6 kg    Examination:  General:  Resting comfortably in bed HENT: NCAT OP clear PULM: CTA B, normal effort CV: RRR, no mgr GI: BS+, soft, nontender Derm: mild erythema and edema legs bilaterally Neuro: confused, pleasant mood   Resolved Hospital Problem list    Assessment & Plan:   Hypotension, shock, bradycardia, hypothermia> resolved   Demand ischemia > treat cellulitis > tele   Dementia/delirium/depression > resume home zyprexa, namenda, lexapro, aricept > hold prn haldol for now but OK to restart if  agitation worsens > hold hold lorazepam  Hypertension: > restart home lisinopril  Bradycardia: not causing hemodynamic compromise at this point and utility of pacemaker placement uncertain > continue tele  Cellulitis legs > stop IV antibiotics > start doxycycline x 7 days  To TRH, tele Restart diet  History of DM2 > start SSI AC/HS  Best practice:  Diet: diabetic diet Pain/Anxiety/Delirium protocol (if indicated): NA VAP protocol (if indicated): NA DVT prophylaxis: Heparin sq GI prophylaxis: NA Glucose control: SSI  Mobility: Bedrest  Code Status: FULL  Family Communication: none Disposition: tele, TRH  Labs   CBC: Recent Labs  Lab 06/20/18 1336  WBC 5.9  NEUTROABS 2.7  HGB 11.5*  HCT 38.4  MCV 98.2  PLT 143*    Basic Metabolic Panel: Recent Labs  Lab 06/20/18 1336  NA 144  K 4.4  CL 106  CO2 32  GLUCOSE 107*  BUN 26*  CREATININE 1.01*  CALCIUM 9.9   GFR: Estimated Creatinine Clearance: 62.3 mL/min (A) (by C-G formula based on SCr of 1.01 mg/dL (H)). Recent Labs  Lab 06/20/18 1336 06/20/18 1523 06/20/18 1711 06/20/18 1911  WBC 5.9  --   --   --   LATICACIDVEN  --  1.0 1.5 1.0    Liver Function Tests: Recent Labs  Lab 06/20/18 1336  AST 18  ALT 16  ALKPHOS 56  BILITOT 0.4  PROT 6.2*  ALBUMIN 3.2*   No results for input(s): LIPASE, AMYLASE in the last 168 hours. No results for input(s): AMMONIA in the last 168 hours.  ABG  Component Value Date/Time   PHART 7.418 06/20/2018 1800   PCO2ART 47.3 06/20/2018 1800   PO2ART 74.5 (L) 06/20/2018 1800   HCO3 30.2 (H) 06/20/2018 1800   O2SAT 96.9 06/20/2018 1800     Coagulation Profile: No results for input(s): INR, PROTIME in the last 168 hours.  Cardiac Enzymes: Recent Labs  Lab 06/20/18 1336 06/20/18 2323 06/21/18 0454  TROPONINI 0.31* 0.20* 0.15*    HbA1C: Hgb A1c MFr Bld  Date/Time Value Ref Range Status  10/18/2008 05:20 PM  4.6 - 6.1 % Final   5.9 (NOTE) The ADA  recommends the following therapeutic goal for glycemic control related to Hgb A1c measurement: Goal of therapy: <6.5 Hgb A1c  Reference: American Diabetes Association: Clinical Practice Recommendations 2010, Diabetes Care, 2010, 33: (Suppl  1).    CBG: Recent Labs  Lab 06/20/18 2124 06/20/18 2318 06/21/18 0348 06/21/18 0801  GLUCAP 135* 142* 147* 79     Heber CarolinaBrent Amjad Fikes, MD [place] [facility] Pager: [phone] Cell: [phone] If no response, call [phone]

## 2018-06-21 NOTE — Progress Notes (Signed)
PHARMACY - PHYSICIAN COMMUNICATION CRITICAL VALUE ALERT - BLOOD CULTURE IDENTIFICATION (BCID)  Penny Carr is an 72 y.o. female who [facility] with a chief complaint of bilateral leg pain.  Patient became lethargic and hypothermic in the ED.  Assessment:  Antibiotics narrowed to doxycycline for leg cellulitis.  Blood culture growing 1 of 4 diptheroids, usually Corynebacterium that is a skin contaminant.  Micro will not run BCID.  Name of physician (or Provider) Contacted:  Dr. Wyatt PortelaSomner   Current antibiotics: doxycycline  Changes to prescribed antibiotics recommended:  Patient is on recommended antibiotics - No changes needed  No results found for this or any previous visit.   Zeek Rostron D. Laney Potashang, PharmD, BCPS, BCCCP 06/21/2018, 7:48 PM

## 2018-06-21 NOTE — Progress Notes (Signed)
Spoke to Cards concerning pt sinus bradycardia. HR sustaining low 30's despite improving BP. Pt remains drowsy but appropriate. Advised to monitor pt for changes in status. No new orders given.

## 2018-06-21 NOTE — Consult Note (Signed)
WOC Nurse wound consult note Reason for Consult:cellulitis and blisters Patient is restrained and not able to really answer questions. No family in the room Wound type: cellulitis, ruptured bulla from edema.  Unclear if patient has been worked up for LE edema, no records of this Pressure Injury POA: NA Measurement:scatttered areas of ruputured bulla largest 1cm x 1cm  Wound bed: clean, pink, moist Drainage (amount, consistency, odor) serous, no odor Periwound:3+ edema with bilateral erythema that is central pretibial region  Dressing procedure/placement/frequency: Soft silicone foam for absorbtion of weeping fluid Elevate feet as much as possible.  FU with primary care for management of LE venous stasis and edema control. Most likely needs long term compression stockings to prevent recurrence.   Discussed POC with bedside nurse.  Re consult if needed, will not follow at this time. Thanks  Oriana Horiuchi M.D.[employer] MSN, RN,CWOCN, CNS, CWON-AP [phone])

## 2018-06-21 NOTE — Progress Notes (Signed)
Tele sitter ordered. Waiting for arrival

## 2018-06-21 NOTE — Progress Notes (Signed)
  Echocardiogram 2D Echocardiogram has been performed.  Penny Carr, Penny Carr 06/21/2018, 11:05 AM

## 2018-06-21 NOTE — Progress Notes (Signed)
Pt would not sit still and grabbed staff while trying to put IV in. RN paged MD for further orders. Will continue to monitor.

## 2018-06-21 NOTE — Progress Notes (Signed)
Progress Note  Patient Name: Secundino GingerDorretta S Mcglone Date of Encounter: 06/21/2018  Primary Cardiologist: Jodelle RedBridgette Emalina Dubreuil (new)  Subjective   Appears more alert today, follows commands. Notes that "everything" is bothering her.   Inpatient Medications    Scheduled Meds: . divalproex  250 mg Oral Q12H  . donepezil  10 mg Oral QHS  . doxycycline  100 mg Oral Q12H  . escitalopram  10 mg Oral Daily  . heparin  5,000 Units Subcutaneous Q8H  . insulin aspart  0-15 Units Subcutaneous TID WC  . insulin aspart  0-5 Units Subcutaneous QHS  . mouth rinse  15 mL Mouth Rinse BID  . memantine  28 mg Oral Daily  . OLANZapine  2.5 mg Oral QHS   Continuous Infusions:  PRN Meds:    Vital Signs    Vitals:   06/21/18 0700 06/21/18 0800 06/21/18 0802 06/21/18 0900  BP:  102/83 102/83 (!) 149/56  Pulse: 64     Resp: (!) 24 17 15 15   Temp: (!) 96.1 F (35.6 C) (!) 96.3 F (35.7 C) (!) 96.1 F (35.6 C) (!) 96.4 F (35.8 C)  TempSrc:  Bladder    SpO2: 90%     Weight:        Intake/Output Summary (Last 24 hours) at 06/21/2018 1012 Last data filed at 06/21/2018 0900 Gross per 24 hour  Intake 2178.93 ml  Output 2445 ml  Net -266.07 ml   Filed Weights   06/21/18 0500  Weight: 116.6 kg    Telemetry    ainus rhythm with sinus arrhythmia. Range 30-mid 80s overnight. One 3 second pause - Personally Reviewed  ECG    No new since 12/30- Personally Reviewed  Physical Exam   GEN: No acute distress.   Neck: No JVD Cardiac: RRR, no murmurs, rubs, or gallops.  Respiratory: Clear to auscultation bilaterally. GI: Soft, nontender, non-distended  MS: Improved from yesterday; still with bilateral nonpitting edema, but blisters are weeping less, pink tone across shins is improved. Neuro:  Nonfocal  Psych: calm  Labs    Chemistry Recent Labs  Lab 06/20/18 1336  NA 144  K 4.4  CL 106  CO2 32  GLUCOSE 107*  BUN 26*  CREATININE 1.01*  CALCIUM 9.9  PROT 6.2*  ALBUMIN  3.2*  AST 18  ALT 16  ALKPHOS 56  BILITOT 0.4  GFRNONAA 55*  GFRAA >60  ANIONGAP 6     Hematology Recent Labs  Lab 06/20/18 1336  WBC 5.9  RBC 3.91  HGB 11.5*  HCT 38.4  MCV 98.2  MCH 29.4  MCHC 29.9*  RDW 16.5*  PLT 143*    Cardiac Enzymes Recent Labs  Lab 06/20/18 1336 06/20/18 2323 06/21/18 0454  TROPONINI 0.31* 0.20* 0.15*   No results for input(s): TROPIPOC in the last 168 hours.   BNP Recent Labs  Lab 06/20/18 1336  BNP 89.8     DDimer No results for input(s): DDIMER in the last 168 hours.   Radiology    Ct Head Wo Contrast  Result Date: 06/20/2018 CLINICAL DATA:  72 year old female with altered mental status. EXAM: CT HEAD WITHOUT CONTRAST TECHNIQUE: Contiguous axial images were obtained from the base of the skull through the vertex without intravenous contrast. COMPARISON:  05/25/2018 CT FINDINGS: Brain: No evidence of acute infarction, hemorrhage, hydrocephalus, extra-axial collection or mass lesion/mass effect. Atrophy and chronic small-vessel white matter ischemic changes again noted. Vascular: No hyperdense vessel or unexpected calcification. Skull: Normal. Negative for fracture or  focal lesion. Sinuses/Orbits: No acute finding. Other: None. IMPRESSION: 1. No evidence of acute intracranial abnormality. 2. Atrophy and chronic small-vessel white matter ischemic changes. Electronically Signed   By: Harmon PierJeffrey  Hu M.D.   On: 06/20/2018 20:41   Ct Angio Chest Pe W/cm &/or Wo Cm  Result Date: 06/20/2018 CLINICAL DATA:  Hypothermia. Bradycardia. Bilateral lower extremity pain. Alzheimer disease. Sepsis. EXAM: CT ANGIOGRAPHY CHEST CT ABDOMEN AND PELVIS WITH CONTRAST TECHNIQUE: Multidetector CT imaging of the chest was performed using the standard protocol during bolus administration of intravenous contrast. Multiplanar CT image reconstructions and MIPs were obtained to evaluate the vascular anatomy. Multidetector CT imaging of the abdomen and pelvis was performed  using the standard protocol during bolus administration of intravenous contrast. CONTRAST:  100mL ISOVUE-370 IOPAMIDOL (ISOVUE-370) INJECTION 76% COMPARISON:  Chest radiograph from earlier today. FINDINGS: CTA CHEST FINDINGS Significantly motion degraded scan, limiting assessment. Cardiovascular: The study is low quality for the evaluation of pulmonary embolism, limited by poor contrast opacification and by significant motion degradation. No central pulmonary emboli. The lobar, segmental and subsegmental pulmonary artery branches are poorly evaluated on this scan, with no convincing filling defects. Atherosclerotic nonaneurysmal thoracic aorta. Normal caliber pulmonary arteries. Mild cardiomegaly. No significant pericardial fluid/thickening. Left anterior descending coronary atherosclerosis. Mediastinum/Nodes: No discrete thyroid nodules. Unremarkable esophagus. No pathologically enlarged axillary, mediastinal or hilar lymph nodes. Lungs/Pleura: No pneumothorax. No pleural effusion. Mild hypoventilatory changes in the dependent lower lobes. No acute consolidative airspace disease, lung masses or significant pulmonary nodules. Musculoskeletal: No aggressive appearing focal osseous lesions. Marked thoracic spondylosis. Review of the MIP images confirms the above findings. CT ABDOMEN and PELVIS FINDINGS Hepatobiliary: Normal liver with no liver mass. Cholelithiasis. No gallbladder distention. No gallbladder wall thickening. No pericholecystic fluid. No biliary ductal dilatation. Pancreas: Normal, with no mass or duct dilation. Spleen: Normal size. No mass. Adrenals/Urinary Tract: Left adrenal 2.5 cm mass with density 65 HU (series 7/image 22). No right adrenal nodules. Hydronephrosis. Simple 1.3 cm lower right renal cyst. No additional renal lesions. Bladder collapsed by indwelling Foley catheter and not well evaluated with no gross bladder abnormality. Stomach/Bowel: Small hiatal hernia. Otherwise normal nondistended  stomach. Normal caliber small bowel with no small bowel wall thickening. Normal appendix. Moderate left colonic diverticulosis, with no large bowel wall thickening or acute pericolonic fat stranding. Vascular/Lymphatic: Atherosclerotic nonaneurysmal abdominal aorta. Patent portal, splenic, hepatic and renal veins. No pathologically enlarged lymph nodes in the abdomen or pelvis. Reproductive: Status post hysterectomy, with no abnormal findings at the vaginal cuff. No adnexal mass. Other: No pneumoperitoneum, ascites or focal fluid collection. Musculoskeletal: No aggressive appearing focal osseous lesions. Mild lumbar spondylosis. Review of the MIP images confirms the above findings. IMPRESSION: 1. Technically very limited motion degraded scan. No central pulmonary emboli. 2. Mild cardiomegaly. 3. Mild hypoventilatory changes in the dependent lungs. Otherwise no active pulmonary disease. 4. No acute abnormality in the abdomen or pelvis. No evidence of bowel obstruction or acute bowel inflammation. Moderate left colonic diverticulosis, with no evidence of acute diverticulitis. 5. Cholelithiasis, with no CT findings of acute cholecystitis. 6. Indeterminate left adrenal mass, probably a benign adrenal adenoma. Follow-up adrenal mass protocol CT abdomen without and with IV contrast suggested in 12 months. This recommendation follows ACR consensus guidelines: Management of Incidental Adrenal Masses: A White Paper of the ACR Incidental Findings Committee. J Am Coll Radiol 2017;14:1038-1044. 7. Small hiatal hernia. Aortic Atherosclerosis (ICD10-I70.0). Electronically Signed   By: Delbert PhenixJason A Poff M.D.   On: 06/20/2018 20:53  Ct Abdomen Pelvis W Contrast  Result Date: 06/20/2018 CLINICAL DATA:  Hypothermia. Bradycardia. Bilateral lower extremity pain. Alzheimer disease. Sepsis. EXAM: CT ANGIOGRAPHY CHEST CT ABDOMEN AND PELVIS WITH CONTRAST TECHNIQUE: Multidetector CT imaging of the chest was performed using the standard  protocol during bolus administration of intravenous contrast. Multiplanar CT image reconstructions and MIPs were obtained to evaluate the vascular anatomy. Multidetector CT imaging of the abdomen and pelvis was performed using the standard protocol during bolus administration of intravenous contrast. CONTRAST:  ISOVUE-370 IOPAMIDOL (ISOVUE-370) INJECTION 76% COMPARISON:  Chest radiograph from earlier today. FINDINGS: CTA CHEST FINDINGS Significantly motion degraded scan, limiting assessment. Cardiovascular: The study is low quality for the evaluation of pulmonary embolism, limited by poor contrast opacification and by significant motion degradation. No central pulmonary emboli. The lobar, segmental and subsegmental pulmonary artery branches are poorly evaluated on this scan, with no convincing filling defects. Atherosclerotic nonaneurysmal thoracic aorta. Normal caliber pulmonary arteries. Mild cardiomegaly. No significant pericardial fluid/thickening. Left anterior descending coronary atherosclerosis. Mediastinum/Nodes: No discrete thyroid nodules. Unremarkable esophagus. No pathologically enlarged axillary, mediastinal or hilar lymph nodes. Lungs/Pleura: No pneumothorax. No pleural effusion. Mild hypoventilatory changes in the dependent lower lobes. No acute consolidative airspace disease, lung masses or significant pulmonary nodules. Musculoskeletal: No aggressive appearing focal osseous lesions. Marked thoracic spondylosis. Review of the MIP images confirms the above findings. CT ABDOMEN and PELVIS FINDINGS Hepatobiliary: Normal liver with no liver mass. Cholelithiasis. No gallbladder distention. No gallbladder wall thickening. No pericholecystic fluid. No biliary ductal dilatation. Pancreas: Normal, with no mass or duct dilation. Spleen: Normal size. No mass. Adrenals/Urinary Tract: Left adrenal 2.5 cm mass with density 65 HU (series 7/image 22). No right adrenal nodules. Hydronephrosis. Simple 1.3 cm  lower right renal cyst. No additional renal lesions. Bladder collapsed by indwelling Foley catheter and not well evaluated with no gross bladder abnormality. Stomach/Bowel: Small hiatal hernia. Otherwise normal nondistended stomach. Normal caliber small bowel with no small bowel wall thickening. Normal appendix. Moderate left colonic diverticulosis, with no large bowel wall thickening or acute pericolonic fat stranding. Vascular/Lymphatic: Atherosclerotic nonaneurysmal abdominal aorta. Patent portal, splenic, hepatic and renal veins. No pathologically enlarged lymph nodes in the abdomen or pelvis. Reproductive: Status post hysterectomy, with no abnormal findings at the vaginal cuff. No adnexal mass. Other: No pneumoperitoneum, ascites or focal fluid collection. Musculoskeletal: No aggressive appearing focal osseous lesions. Mild lumbar spondylosis. Review of the MIP images confirms the above findings. IMPRESSION: 1. Technically very limited motion degraded scan. No central pulmonary emboli. 2. Mild cardiomegaly. 3. Mild hypoventilatory changes in the dependent lungs. Otherwise no active pulmonary disease. 4. No acute abnormality in the abdomen or pelvis. No evidence of bowel obstruction or acute bowel inflammation. Moderate left colonic diverticulosis, with no evidence of acute diverticulitis. 5. Cholelithiasis, with no CT findings of acute cholecystitis. 6. Indeterminate left adrenal mass, probably a benign adrenal adenoma. Follow-up adrenal mass protocol CT abdomen without and with IV contrast suggested in 12 months. This recommendation follows ACR consensus guidelines: Management of Incidental Adrenal Masses: A White Paper of the ACR Incidental Findings Committee. J Am Coll Radiol 2017;14:1038-1044. 7. Small hiatal hernia. Aortic Atherosclerosis (ICD10-I70.0). Electronically Signed   By: Delbert Phenix M.D.   On: 06/20/2018 20:53   Dg Chest Port 1 View  Result Date: 06/21/2018 CLINICAL DATA:  Follow-up  vascular congestion. EXAM: PORTABLE CHEST 1 VIEW COMPARISON:  CT 06/20/2018.  Chest x-ray 06/20/2018. FINDINGS: Stable cardiomegaly. Improved pulmonary venous congestion. No focal infiltrate. No pleural effusion or pneumothorax.  No acute bony abnormality. IMPRESSION: Stable cardiomegaly. Improved pulmonary venous congestion. No acute pulmonary disease. Electronically Signed   By: Maisie Fus  Register   On: 06/21/2018 05:24   Dg Chest Portable 1 View  Result Date: 06/20/2018 CLINICAL DATA:  Altered mental status EXAM: PORTABLE CHEST 1 VIEW COMPARISON:  05/25/2018, 03/26/2010 FINDINGS: Low lung volumes. Enlarged cardiomediastinal silhouette with mild central vascular congestion. No acute consolidation. No pneumothorax. IMPRESSION: Low lung volumes.  Cardiomegaly with central vascular congestion. Electronically Signed   By: Jasmine Pang M.D.   On: 06/20/2018 16:01    Cardiac Studies   Echo 12/31 - Left ventricle: The cavity size was normal. Wall thickness was   normal. Systolic function was normal. The estimated ejection   fraction was in the range of 60% to 65%. Wall motion was normal;   there were no regional wall motion abnormalities. Doppler   parameters are consistent with abnormal left ventricular   relaxation (grade 1 diastolic dysfunction). - Mitral valve: Calcified annulus. - Left atrium: The atrium was moderately dilated. - Pulmonary arteries: PA peak pressure: 31 mm Hg (S).  Patient Profile     72 y.o. female with largely unknown PMH (from SNF, no family available) other than Alzheimer's disease who presented with shock of unclear etiology. Shock now resolved.  Assessment & Plan    Hypotension: hypotension fully resolved, and has been trending towards hypertension. Unclear etiology of her hypotension. She was briefly on pressors yesterday and today requires none. Treatment was cessation of her home medications and initiation of antibiotics. Presumed sepsis.  Bradycardia: on no  direct AV nodal agents, appears to be sinus rhythm with sinus arrhythmia. One brief pause overnight. Bradycardia in the setting of hypotension was inappropriate, but her tele shows that her heart rate got to the mid-80s overnight event without exertion. This suggests that bradycardia is not the fundamental problem. Furthermore, she would likely not do well with transvenous pacemaker placement given the need for motion restriction post device. No indication for pacemaker at this time.  Elevated troponin: has been downtrending. No chest pain, no acute ECG changes. Likely demand 2/2 sepsis. Echo with normal LV EF, no wall motion abnormalities. I do not have any records of prior CAD evaluation. Would allow her to recover from this event, then consider outpatient stress test.  [facility] will sign off at this time.   Medication Recommendations:  Restart antihypertensives as BP allows, would start with lisinopril. LFTs normal, ok to restart statin Other recommendations (labs, testing, etc):  We will discuss outpatient lexiscan stress at outpatient follow up Follow up as an outpatient:  We will arrange follow up with me after discharge.  For questions or updates, please contact [facility] Please consult [email or web address] for contact info under     Signed, [name], MD  06/21/2018, 10:12 AM

## 2018-06-22 LAB — URINE CULTURE: Culture: NO GROWTH

## 2018-06-22 LAB — BASIC METABOLIC PANEL
Anion gap: 6 (ref 5–15)
BUN: 12 mg/dL (ref 8–23)
CHLORIDE: 105 mmol/L (ref 98–111)
CO2: 33 mmol/L — AB (ref 22–32)
Calcium: 9.9 mg/dL (ref 8.9–10.3)
Creatinine, Ser: 0.81 mg/dL (ref 0.44–1.00)
GFR calc Af Amer: >60 mL/min (ref >60)
GFR calc non Af Amer: >60 mL/min (ref >60)
Glucose, Bld: 101 mg/dL — ABNORMAL HIGH (ref 70–99)
Potassium: 4 mmol/L (ref 3.5–5.1)
Sodium: 144 mmol/L (ref 135–145)

## 2018-06-22 LAB — GLUCOSE, CAPILLARY
GLUCOSE-CAPILLARY: 107 mg/dL — AB (ref 70–99)
Glucose-Capillary: 91 mg/dL (ref 70–99)
Glucose-Capillary: 122 mg/dL — ABNORMAL HIGH (ref 70–99)
Glucose-Capillary: 107 mg/dL — ABNORMAL HIGH (ref 70–99)

## 2018-06-22 MED ORDER — ACETAMINOPHEN 325 MG PO TABS
650.0000 mg | ORAL_TABLET | Freq: Four times a day (QID) | ORAL | Status: DC | PRN
  Administered 2018-06-22 – 2018-06-25 (×5): 650 mg via ORAL
  Filled 2018-06-22 (×6): qty 2

## 2018-06-22 MED ORDER — SODIUM CHLORIDE 0.45 % IV SOLN
INTRAVENOUS | Status: DC
  Administered 2018-06-22: 13:00:00 via INTRAVENOUS

## 2018-06-22 NOTE — Progress Notes (Signed)
telesitter has arrived and plugged in.

## 2018-06-22 NOTE — Progress Notes (Signed)
Tele sitter ordered earlier in the night and still has not gotten hear. Called to verify and they said they are going to get one down here.

## 2018-06-22 NOTE — Plan of Care (Signed)
  Problem: Activity: Goal: Capacity to carry out activities will improve Outcome: Progressing   Problem: Cardiac: Goal: Ability to achieve and maintain adequate cardiopulmonary perfusion will improve Outcome: Progressing   Problem: Clinical Measurements: Goal: Ability to maintain clinical measurements within normal limits will improve Outcome: Progressing Goal: Will remain free from infection Outcome: Progressing Goal: Diagnostic test results will improve Outcome: Progressing Goal: Respiratory complications will improve Outcome: Progressing Goal: Cardiovascular complication will be avoided Outcome: Progressing   Problem: Activity: Goal: Risk for activity intolerance will decrease Outcome: Progressing   Problem: Nutrition: Goal: Adequate nutrition will be maintained Outcome: Progressing   Problem: Skin Integrity: Goal: Risk for impaired skin integrity will decrease Outcome: Progressing   Problem: Spiritual Needs Goal: Ability to function at adequate level Outcome: Not Met (add Reason)   Problem: Education: Goal: Ability to demonstrate management of disease process will improve Outcome: Not Met (add Reason) Goal: Ability to verbalize understanding of medication therapies will improve Outcome: Not Met (add Reason) Goal: Individualized Educational Video(s) Outcome: Not Met (add Reason)   Problem: Education: Goal: Knowledge of General Education information will improve Description Including pain rating scale, medication(s)/side effects and non-pharmacologic comfort measures Outcome: Not Met (add Reason)   Problem: Health Behavior/Discharge Planning: Goal: Ability to manage health-related needs will improve Outcome: Not Met (add Reason)   Problem: Coping: Goal: Level of anxiety will decrease Outcome: Not Met (add Reason)   Problem: Elimination: Goal: Will not experience complications related to bowel motility Outcome: Not Met (add Reason) Goal: Will not  experience complications related to urinary retention Outcome: Not Met (add Reason)   Problem: Pain Managment: Goal: General experience of comfort will improve Outcome: Not Met (add Reason)   Problem: Safety: Goal: Ability to remain free from injury will improve Outcome: Not Met (add Reason)

## 2018-06-22 NOTE — Progress Notes (Signed)
PROGRESS NOTE                                                                                                                                                                                                             Patient Demographics:    Penny Carr, is a 73 y.o. female, DOB - 1946/02/11, UJW:[id number]  Admit date - 06/20/2018   Admitting Physician Josephine IgoBradley L Icard, DO  Outpatient Primary MD for the patient is Samuel JesterButler, Cynthia, DO  LOS - 2   Chief Complaint  Patient presents with  . Leg Pain       Brief Narrative   73 year old female with past medical history of dementia, lives at SNF, presents with facility for complaining of bilateral lower extremity pain, she was noted to be bradycardic, hypotensive, she was admitted to ICU, required pressor support briefly, is to be improving, transferred to [facility] 06/22/2018.    Subjective:    Penny Carr is confused, cannot provide any complaints, no significant events overnight .    Assessment  & Plan :    Active Problems:   Shock (HCC)   Hypotension,  bradycardia, hypothermia -No clear etiology, no evidence of infectious process, negative blood cultures, UA and urine cultures, process on CT chest, abdomen and pelvis. -Admitted to ICU initially, was on broad-spectrum antibiotics and pressors, has been weaned off pressors, currently off antibiotics.  Elevated troponins -Felt most likely to be in the setting of demand ischemia, Elysia input greatly appreciated, no further work-up indicated at this point, echo with normal EF, with no regional wall motion abnormalities. -Stress test as an outpatient per cardiology -Cardiology Signed off  Dementia/delirium/depression - resume home zyprexa, namenda, lexapro, aricept -hold prn haldol for now but OK to restart if agitation worsens - hold hold lorazepam  Hypertension: -restart home lisinopril  Bradycardia:  -Cardiology input  greatly appreciated, not on any AV nodal agents, new with telemetry monitoring, , no indication for pacemaker at this time .  Cellulitis legs -Blood cultures are negative, stop IV antibiotics, continue with doxycycline  History of DM2 -continue with insulin sliding scale     Code Status : Full  Family Communication  : none at base line  Disposition Plan  : back to SNF when at baseline  Consults  :  PCCM  Procedures  : none  DVT  Prophylaxis  : [place] heparin  Lab Results  Component Value Date   PLT 143 (L) 06/20/2018    Antibiotics  :    Anti-infectives (From admission, onward)   Start     Dose/Rate Route Frequency Ordered Stop   06/21/18 2200  vancomycin (VANCOCIN) 1,250 mg in sodium chloride 0.9 % 250 mL IVPB  Status:  Discontinued     1,250 mg 166.7 mL/hr over 90 Minutes Intravenous Every 24 hours 06/20/18 1906 06/20/18 1907   06/21/18 2200  vancomycin (VANCOCIN) IVPB 1000 mg/200 mL premix  Status:  Discontinued     1,000 mg 200 mL/hr over 60 Minutes Intravenous Every 24 hours 06/20/18 1907 06/21/18 0936   06/21/18 1000  doxycycline (VIBRA-TABS) tablet 100 mg     100 mg Oral Every 12 hours 06/21/18 0936 06/28/18 0959   06/21/18 0600  ceFEPIme (MAXIPIME) 2 g in sodium chloride 0.9 % 100 mL IVPB  Status:  Discontinued     2 g 200 mL/hr over 30 Minutes Intravenous Every 12 hours 06/20/18 1906 06/21/18 0936   06/20/18 2000  vancomycin (VANCOCIN) 2,000 mg in sodium chloride 0.9 % 500 mL IVPB  Status:  Discontinued     2,000 mg 250 mL/hr over 120 Minutes Intravenous  Once 06/20/18 1906 06/20/18 1907   06/20/18 2000  vancomycin (VANCOCIN) 2,000 mg in sodium chloride 0.9 % 500 mL IVPB     2,000 mg 250 mL/hr over 120 Minutes Intravenous  Once 06/20/18 1907 06/21/18 0000   06/20/18 1900  vancomycin (VANCOCIN) IVPB 1000 mg/200 mL premix  Status:  Discontinued     1,000 mg 200 mL/hr over 60 Minutes Intravenous Every 12 hours 06/20/18 1845 06/20/18 1905   06/20/18 1715   ceFEPIme (MAXIPIME) 2 g in sodium chloride 0.9 % 100 mL IVPB     2 g 200 mL/hr over 30 Minutes Intravenous  Once 06/20/18 1714 06/20/18 1908   06/20/18 1715  metroNIDAZOLE (FLAGYL) IVPB 500 mg  Status:  Discontinued     500 mg 100 mL/hr over 60 Minutes Intravenous Every 8 hours 06/20/18 1714 06/21/18 0936        Objective:   Vitals:   06/21/18 1401 06/21/18 1959 06/21/18 2358 06/22/18 0608  BP: 120/79 (!) 111/93 (!) 109/47 (!) 97/34  Pulse: 61 96 (!) 55 (!) 50  Resp: 20 18 20 16   Temp: 97.9 F (36.6 C) (!) 97.4 F (36.3 C) (!) 97.4 F (36.3 C) 97.6 F (36.4 C)  TempSrc: Oral Oral Oral Oral  SpO2:  90% 97% 96%  Weight: 115.7 kg   112.9 kg  Height: 5\' 3"  (1.6 m)       Wt Readings from Last 3 Encounters:  06/22/18 112.9 kg  05/25/18 99.8 kg     Intake/Output Summary (Last 24 hours) at 06/22/2018 1104 Last data filed at 06/22/2018 0400 Gross per 24 hour  Intake 240 ml  Output 1075 ml  Net -835 ml     Physical Exam  Awake Alert, confused, but pleasant Symmetrical Chest wall movement, Good air movement bilaterally, CTAB RRR,No Gallops,Rubs or new Murmurs, No Parasternal Heave +ve B.Sounds, Abd Soft, No tenderness,  No rebound - guarding or rigidity. No Cyanosis, Clubbing or edema, has some bilateral erythema, with minimal ulcer.    Data Review:    CBC Recent Labs  Lab 06/20/18 1336  WBC 5.9  HGB 11.5*  HCT 38.4  PLT 143*  MCV 98.2  MCH 29.4  MCHC 29.9*  RDW 16.5*  LYMPHSABS 2.4  MONOABS 0.6  EOSABS 0.1  BASOSABS 0.0    Chemistries  Recent Labs  Lab 06/20/18 1336 06/22/18 0523  NA 144 144  K 4.4 4.0  CL 106 105  CO2 32 33*  GLUCOSE 107* 101*  BUN 26* 12  CREATININE 1.01* 0.81  CALCIUM 9.9 9.9  AST 18  --   ALT 16  --   ALKPHOS 56  --   BILITOT 0.4  --    ------------------------------------------------------------------------------------------------------------------ No results for input(s): CHOL, HDL, LDLCALC, TRIG, CHOLHDL, LDLDIRECT  in the last 72 hours.  Lab Results  Component Value Date   HGBA1C  10/18/2008    5.9 (NOTE) The ADA recommends the following therapeutic goal for glycemic control related to Hgb A1c measurement: Goal of therapy: <6.5 Hgb A1c  Reference: American Diabetes Association: Clinical Practice Recommendations 2010, Diabetes Care, 2010, 33: (Suppl  1).   ------------------------------------------------------------------------------------------------------------------ Recent Labs    06/20/18 1634  TSH 4.132  T4TOTAL 6.0   ------------------------------------------------------------------------------------------------------------------ No results for input(s): VITAMINB12, FOLATE, FERRITIN, TIBC, IRON, RETICCTPCT in the last 72 hours.  Coagulation profile No results for input(s): INR, PROTIME in the last 168 hours.  No results for input(s): DDIMER in the last 72 hours.  Cardiac Enzymes Recent Labs  Lab 06/20/18 2323 06/21/18 0454 06/21/18 1113  TROPONINI 0.20* 0.15* 0.17*   ------------------------------------------------------------------------------------------------------------------    Component Value Date/Time   BNP 89.8 06/20/2018 1336    Inpatient Medications  Scheduled Meds: . divalproex  250 mg Oral Q12H  . donepezil  10 mg Oral QHS  . doxycycline  100 mg Oral Q12H  . escitalopram  10 mg Oral Daily  . heparin  5,000 Units Subcutaneous Q8H  . insulin aspart  0-15 Units Subcutaneous TID WC  . insulin aspart  0-5 Units Subcutaneous QHS  . mouth rinse  15 mL Mouth Rinse BID  . memantine  28 mg Oral Daily  . OLANZapine  2.5 mg Oral QHS   Continuous Infusions: PRN Meds:.  Micro Results Recent Results (from the past 240 hour(s))  Blood Culture (routine x 2)     Status: None (Preliminary result)   Collection Time: 06/20/18  5:15 PM  Result Value Ref Range Status   Specimen Description   Final    BLOOD LEFT FOREARM Performed at [facility], [address], [place], [place] [address]    Special Requests   Final    BOTTLES DRAWN AEROBIC AND ANAEROBIC Blood Culture adequate volume Performed at [facility], [address] [place], [place] [address]    Culture  Setup Time   Final    GRAM POSITIVE RODS AEROBIC BOTTLE ONLY CRITICAL RESULT CALLED TO, READ BACK BY AND VERIFIED WITH: Serita Grit [phone] MLM Performed at [facility], [address], [place], [place] [address]    Culture GRAM POSITIVE RODS  Final   Report Status PENDING  Incomplete  Blood Culture (routine x 2)     Status: None (Preliminary result)   Collection Time: 06/20/18  5:19 PM  Result Value Ref Range Status   Specimen Description   Final    BLOOD RIGHT HAND Performed at [facility], [address], [place], [place] [address]    Special Requests   Final    BOTTLES DRAWN AEROBIC ONLY Blood Culture results may not be optimal due to an inadequate volume of blood received in culture bottles Performed at [facility], [address] [place],  [place] [address]    Culture   Final    NO GROWTH < 24 HOURS Performed at [facility], [address] [place], [place] [address]    Report Status PENDING  Incomplete  Urine culture     Status: None   Collection Time: 06/20/18  7:44 PM  Result Value Ref Range Status   Specimen Description   Final    URINE, CATHETERIZED Performed at [facility], [address] [place], [place] [address]    Special Requests   Final    NONE Performed at [facility], [address] [place], [place] [address]    Culture   Final    NO GROWTH Performed at [facility], [address], [place], [place] [address]    Report Status 06/22/2018 FINAL  Final  MRSA PCR Screening     Status: None   Collection Time: 06/20/18  9:30 PM  Result Value Ref Range Status   MRSA by PCR NEGATIVE NEGATIVE Final    Comment:          The GeneXpert MRSA Assay (FDA approved for NASAL specimens only), is one component of a comprehensive MRSA colonization surveillance program. It is not intended to diagnose MRSA infection nor to guide or monitor treatment for MRSA infections. Performed at [facility], [address], [place], [place] [address]     Radiology Reports Dg Chest 2 View  Result Date: 05/25/2018 CLINICAL DATA:  Fall.  Chest pain EXAM: CHEST - 2 VIEW COMPARISON:  03/26/2010 FINDINGS: Mild cardiac enlargement. Negative for heart failure. Negative for infiltrate effusion or mass. IMPRESSION: No active cardiopulmonary disease. Electronically Signed   By: Marlan Palau M.D.   On: 05/25/2018 13:20   Ct Head Wo Contrast  Result Date: 06/20/2018 CLINICAL DATA:  73 year old female with altered mental status. EXAM: CT HEAD WITHOUT CONTRAST TECHNIQUE: Contiguous axial images were obtained from the base of the skull through the vertex without intravenous contrast. COMPARISON:  05/25/2018 CT FINDINGS: Brain: No evidence of acute infarction, hemorrhage, hydrocephalus, extra-axial collection or mass lesion/mass effect. Atrophy and chronic small-vessel white matter ischemic changes again noted. Vascular: No hyperdense vessel or unexpected calcification. Skull: Normal. Negative for fracture or focal lesion. Sinuses/Orbits: No acute finding. Other: None. IMPRESSION: 1. No evidence of acute intracranial abnormality. 2. Atrophy and chronic small-vessel white matter ischemic changes. Electronically Signed   By: Harmon Pier M.D.   On: 06/20/2018 20:41   Ct Head Wo Contrast  Result Date: 05/25/2018 CLINICAL DATA:  Unwitnessed fall. EXAM: CT HEAD WITHOUT CONTRAST CT CERVICAL SPINE WITHOUT CONTRAST TECHNIQUE: Multidetector CT imaging of the head and cervical spine was performed following the standard protocol without intravenous contrast. Multiplanar CT image reconstructions of the cervical spine were also generated. COMPARISON:   None. FINDINGS: CT HEAD FINDINGS Brain: No subdural, epidural, or subarachnoid hemorrhage. Cerebellum, brainstem, and basal cisterns are normal. Ventricles and sulci are unremarkable. Tiny lacunar infarct in the anterior limb of the right internal capsule. Scattered white matter changes noted. No acute cortical ischemia or infarct. Ventricles and sulci are unremarkable. No mass effect or midline shift. Vascular: No hyperdense vessel or unexpected calcification. Skull: Normal. Negative for fracture or focal lesion. Sinuses/Orbits: No acute finding. Other: There is a laceration over the left eye. The globe is intact extracranial soft tissues otherwise normal. CT CERVICAL SPINE FINDINGS Alignment: No traumatic malalignment. Mild reversal of normal lordosis centered at C5-6. Skull base and vertebrae: No acute  fracture. No primary bone lesion or focal pathologic process. Soft tissues and spinal canal: Narrowing of the spinal canal at C5-6 due to a posterior osteophyte. The anterior diameter of the canal in this region is 6 mm. No other abnormalities identified. Disc levels:  Multilevel degenerative changes. Upper chest: Negative. Other: No other abnormalities. IMPRESSION: 1. Scattered white matter changes. Tiny age-indeterminate lacunar infarct in the anterior limb of the right internal capsule. No other acute intracranial abnormalities are noted. 2. Degenerative changes, most marked at C5-6. Narrowing of the spinal canal to 6 mm in AP diameter at C5-6 due to a posterior osteophyte. 3. No fracture or traumatic malalignment in the cervical spine. Electronically Signed   By: Gerome Sam III M.D   On: 05/25/2018 15:05   Ct Angio Chest Pe W/cm &/or Wo Cm  Result Date: 06/20/2018 CLINICAL DATA:  Hypothermia. Bradycardia. Bilateral lower extremity pain. Alzheimer disease. Sepsis. EXAM: CT ANGIOGRAPHY CHEST CT ABDOMEN AND PELVIS WITH CONTRAST TECHNIQUE: Multidetector CT imaging of the chest was performed using the  standard protocol during bolus administration of intravenous contrast. Multiplanar CT image reconstructions and MIPs were obtained to evaluate the vascular anatomy. Multidetector CT imaging of the abdomen and pelvis was performed using the standard protocol during bolus administration of intravenous contrast. CONTRAST:  ISOVUE-370 IOPAMIDOL (ISOVUE-370) INJECTION 76% COMPARISON:  Chest radiograph from earlier today. FINDINGS: CTA CHEST FINDINGS Significantly motion degraded scan, limiting assessment. Cardiovascular: The study is low quality for the evaluation of pulmonary embolism, limited by poor contrast opacification and by significant motion degradation. No central pulmonary emboli. The lobar, segmental and subsegmental pulmonary artery branches are poorly evaluated on this scan, with no convincing filling defects. Atherosclerotic nonaneurysmal thoracic aorta. Normal caliber pulmonary arteries. Mild cardiomegaly. No significant pericardial fluid/thickening. Left anterior descending coronary atherosclerosis. Mediastinum/Nodes: No discrete thyroid nodules. Unremarkable esophagus. No pathologically enlarged axillary, mediastinal or hilar lymph nodes. Lungs/Pleura: No pneumothorax. No pleural effusion. Mild hypoventilatory changes in the dependent lower lobes. No acute consolidative airspace disease, lung masses or significant pulmonary nodules. Musculoskeletal: No aggressive appearing focal osseous lesions. Marked thoracic spondylosis. Review of the MIP images confirms the above findings. CT ABDOMEN and PELVIS FINDINGS Hepatobiliary: Normal liver with no liver mass. Cholelithiasis. No gallbladder distention. No gallbladder wall thickening. No pericholecystic fluid. No biliary ductal dilatation. Pancreas: Normal, with no mass or duct dilation. Spleen: Normal size. No mass. Adrenals/Urinary Tract: Left adrenal 2.5 cm mass with density 65 HU (series 7/image 22). No right adrenal nodules. Hydronephrosis. Simple  1.3 cm lower right renal cyst. No additional renal lesions. Bladder collapsed by indwelling Foley catheter and not well evaluated with no gross bladder abnormality. Stomach/Bowel: Small hiatal hernia. Otherwise normal nondistended stomach. Normal caliber small bowel with no small bowel wall thickening. Normal appendix. Moderate left colonic diverticulosis, with no large bowel wall thickening or acute pericolonic fat stranding. Vascular/Lymphatic: Atherosclerotic nonaneurysmal abdominal aorta. Patent portal, splenic, hepatic and renal veins. No pathologically enlarged lymph nodes in the abdomen or pelvis. Reproductive: Status post hysterectomy, with no abnormal findings at the vaginal cuff. No adnexal mass. Other: No pneumoperitoneum, ascites or focal fluid collection. Musculoskeletal: No aggressive appearing focal osseous lesions. Mild lumbar spondylosis. Review of the MIP images confirms the above findings. IMPRESSION: 1. Technically very limited motion degraded scan. No central pulmonary emboli. 2. Mild cardiomegaly. 3. Mild hypoventilatory changes in the dependent lungs. Otherwise no active pulmonary disease. 4. No acute abnormality in the abdomen or pelvis. No evidence of bowel obstruction or acute bowel inflammation.  Moderate left colonic diverticulosis, with no evidence of acute diverticulitis. 5. Cholelithiasis, with no CT findings of acute cholecystitis. 6. Indeterminate left adrenal mass, probably a benign adrenal adenoma. Follow-up adrenal mass protocol CT abdomen without and with IV contrast suggested in 12 months. This recommendation follows ACR consensus guidelines: Management of Incidental Adrenal Masses: A White Paper of the ACR Incidental Findings Committee. J Am Coll Radiol 2017;14:1038-1044. 7. Small hiatal hernia. Aortic Atherosclerosis (ICD10-I70.0). Electronically Signed   By: Delbert PhenixJason A Poff M.D.   On: 06/20/2018 20:53   Ct Cervical Spine Wo Contrast  Result Date: 05/25/2018 CLINICAL DATA:   Unwitnessed fall. EXAM: CT HEAD WITHOUT CONTRAST CT CERVICAL SPINE WITHOUT CONTRAST TECHNIQUE: Multidetector CT imaging of the head and cervical spine was performed following the standard protocol without intravenous contrast. Multiplanar CT image reconstructions of the cervical spine were also generated. COMPARISON:  None. FINDINGS: CT HEAD FINDINGS Brain: No subdural, epidural, or subarachnoid hemorrhage. Cerebellum, brainstem, and basal cisterns are normal. Ventricles and sulci are unremarkable. Tiny lacunar infarct in the anterior limb of the right internal capsule. Scattered white matter changes noted. No acute cortical ischemia or infarct. Ventricles and sulci are unremarkable. No mass effect or midline shift. Vascular: No hyperdense vessel or unexpected calcification. Skull: Normal. Negative for fracture or focal lesion. Sinuses/Orbits: No acute finding. Other: There is a laceration over the left eye. The globe is intact extracranial soft tissues otherwise normal. CT CERVICAL SPINE FINDINGS Alignment: No traumatic malalignment. Mild reversal of normal lordosis centered at C5-6. Skull base and vertebrae: No acute fracture. No primary bone lesion or focal pathologic process. Soft tissues and spinal canal: Narrowing of the spinal canal at C5-6 due to a posterior osteophyte. The anterior diameter of the canal in this region is 6 mm. No other abnormalities identified. Disc levels:  Multilevel degenerative changes. Upper chest: Negative. Other: No other abnormalities. IMPRESSION: 1. Scattered white matter changes. Tiny age-indeterminate lacunar infarct in the anterior limb of the right internal capsule. No other acute intracranial abnormalities are noted. 2. Degenerative changes, most marked at C5-6. Narrowing of the spinal canal to 6 mm in AP diameter at C5-6 due to a posterior osteophyte. 3. No fracture or traumatic malalignment in the cervical spine. Electronically Signed   By: Gerome Samavid  Williams III M.D   On:  05/25/2018 15:05   Ct Abdomen Pelvis W Contrast  Result Date: 06/20/2018 CLINICAL DATA:  Hypothermia. Bradycardia. Bilateral lower extremity pain. Alzheimer disease. Sepsis. EXAM: CT ANGIOGRAPHY CHEST CT ABDOMEN AND PELVIS WITH CONTRAST TECHNIQUE: Multidetector CT imaging of the chest was performed using the standard protocol during bolus administration of intravenous contrast. Multiplanar CT image reconstructions and MIPs were obtained to evaluate the vascular anatomy. Multidetector CT imaging of the abdomen and pelvis was performed using the standard protocol during bolus administration of intravenous contrast. CONTRAST:  100mL ISOVUE-370 IOPAMIDOL (ISOVUE-370) INJECTION 76% COMPARISON:  Chest radiograph from earlier today. FINDINGS: CTA CHEST FINDINGS Significantly motion degraded scan, limiting assessment. Cardiovascular: The study is low quality for the evaluation of pulmonary embolism, limited by poor contrast opacification and by significant motion degradation. No central pulmonary emboli. The lobar, segmental and subsegmental pulmonary artery branches are poorly evaluated on this scan, with no convincing filling defects. Atherosclerotic nonaneurysmal thoracic aorta. Normal caliber pulmonary arteries. Mild cardiomegaly. No significant pericardial fluid/thickening. Left anterior descending coronary atherosclerosis. Mediastinum/Nodes: No discrete thyroid nodules. Unremarkable esophagus. No pathologically enlarged axillary, mediastinal or hilar lymph nodes. Lungs/Pleura: No pneumothorax. No pleural effusion. Mild hypoventilatory changes in the dependent  lower lobes. No acute consolidative airspace disease, lung masses or significant pulmonary nodules. Musculoskeletal: No aggressive appearing focal osseous lesions. Marked thoracic spondylosis. Review of the MIP images confirms the above findings. CT ABDOMEN and PELVIS FINDINGS Hepatobiliary: Normal liver with no liver mass. Cholelithiasis. No gallbladder  distention. No gallbladder wall thickening. No pericholecystic fluid. No biliary ductal dilatation. Pancreas: Normal, with no mass or duct dilation. Spleen: Normal size. No mass. Adrenals/Urinary Tract: Left adrenal 2.5 cm mass with density 65 HU (series 7/image 22). No right adrenal nodules. Hydronephrosis. Simple 1.3 cm lower right renal cyst. No additional renal lesions. Bladder collapsed by indwelling Foley catheter and not well evaluated with no gross bladder abnormality. Stomach/Bowel: Small hiatal hernia. Otherwise normal nondistended stomach. Normal caliber small bowel with no small bowel wall thickening. Normal appendix. Moderate left colonic diverticulosis, with no large bowel wall thickening or acute pericolonic fat stranding. Vascular/Lymphatic: Atherosclerotic nonaneurysmal abdominal aorta. Patent portal, splenic, hepatic and renal veins. No pathologically enlarged lymph nodes in the abdomen or pelvis. Reproductive: Status post hysterectomy, with no abnormal findings at the vaginal cuff. No adnexal mass. Other: No pneumoperitoneum, ascites or focal fluid collection. Musculoskeletal: No aggressive appearing focal osseous lesions. Mild lumbar spondylosis. Review of the MIP images confirms the above findings. IMPRESSION: 1. Technically very limited motion degraded scan. No central pulmonary emboli. 2. Mild cardiomegaly. 3. Mild hypoventilatory changes in the dependent lungs. Otherwise no active pulmonary disease. 4. No acute abnormality in the abdomen or pelvis. No evidence of bowel obstruction or acute bowel inflammation. Moderate left colonic diverticulosis, with no evidence of acute diverticulitis. 5. Cholelithiasis, with no CT findings of acute cholecystitis. 6. Indeterminate left adrenal mass, probably a benign adrenal adenoma. Follow-up adrenal mass protocol CT abdomen without and with IV contrast suggested in 12 months. This recommendation follows ACR consensus guidelines: Management of Incidental  Adrenal Masses: A White Paper of the ACR Incidental Findings Committee. J Am Coll Radiol 2017;14:1038-1044. 7. Small hiatal hernia. Aortic Atherosclerosis (ICD10-I70.0). Electronically Signed   By: Delbert Phenix M.D.   On: 06/20/2018 20:53   Dg Chest Port 1 View  Result Date: 06/21/2018 CLINICAL DATA:  Follow-up vascular congestion. EXAM: PORTABLE CHEST 1 VIEW COMPARISON:  CT 06/20/2018.  Chest x-ray 06/20/2018. FINDINGS: Stable cardiomegaly. Improved pulmonary venous congestion. No focal infiltrate. No pleural effusion or pneumothorax. No acute bony abnormality. IMPRESSION: Stable cardiomegaly. Improved pulmonary venous congestion. No acute pulmonary disease. Electronically Signed   By: Maisie Fus  Register   On: 06/21/2018 05:24   Dg Chest Portable 1 View  Result Date: 06/20/2018 CLINICAL DATA:  Altered mental status EXAM: PORTABLE CHEST 1 VIEW COMPARISON:  05/25/2018, 03/26/2010 FINDINGS: Low lung volumes. Enlarged cardiomediastinal silhouette with mild central vascular congestion. No acute consolidation. No pneumothorax. IMPRESSION: Low lung volumes.  Cardiomegaly with central vascular congestion. Electronically Signed   By: Jasmine Pang M.D.   On: 06/20/2018 16:01      Huey Bienenstock M.D on 06/22/2018 at 11:04 AM  Between 7am to 7pm - Pager - [phone]  After 7pm go to [email or web address] - password [facility]  [facility] -  Office  [phone]

## 2018-06-22 NOTE — Progress Notes (Signed)
Tele sitter ordered. Just waiting for it to come

## 2018-06-22 NOTE — Evaluation (Signed)
Physical Therapy Evaluation Patient Details Name: Penny Carr MRN: [id number] DOB: 1945-09-27 Today's Date: 06/22/2018   History of Present Illness  Patient is a 73 y/o female who presents with BLE pain, however upon admission found to have bradycardia, hypotension and hypothermia, transferred to ICU and put on pressors. PMH includes dementia, DM.  Clinical Impression  Patient presents with baseline cognitive deficits, decreased activity tolerance and impaired mobility s/p above. Tolerated transfers and gait training with close min guard-Min A for balance/safety. Required 1 seated rest break during ambulation. Pt saturated in urine upon arrival and not aware. [facility] Anticipate pt will be good to return there at d/c. Will follow acutely to maximize independence and mobility prior to return to SNF.     Follow Up Recommendations Supervision for mobility/OOB;Supervision/Assistance - 24 hour;SNF(return to SNF)    Equipment Recommendations  None recommended by PT    Recommendations for Other Services       Precautions / Restrictions Precautions Precautions: Fall Precaution Comments: tele sitter Restrictions Weight Bearing Restrictions: No      Mobility  Bed Mobility Overal bed mobility: Needs Assistance Bed Mobility: Rolling;Sidelying to Sit Rolling: Supervision Sidelying to sit: HOB elevated;Min assist       General bed mobility comments: Able to get to EOB with min A for trunk.  Transfers Overall transfer level: Needs assistance Equipment used: Rolling walker (2 wheeled) Transfers: Sit to/from Stand Sit to Stand: Min guard         General transfer comment: min guard for safety. Stood from [employer] x2, from chair x2.   Ambulation/Gait Ambulation/Gait assistance: Min guard Gait Distance (Feet): 100 Feet(x2 bouts) Assistive device: Rolling walker (2 wheeled) Gait Pattern/deviations: Step-through pattern;Decreased stride length      General Gait Details: Slow, mostly steady gait with assist for RW navigation. HR in 50s bpm. 1 seated rest break.   Stairs            Wheelchair Mobility    Modified Rankin (Stroke Patients Only)       Balance Overall balance assessment: Needs assistance Sitting-balance support: Feet supported;No upper extremity supported Sitting balance-Leahy Scale: Fair     Standing balance support: During functional activity;Bilateral upper extremity supported Standing balance-Leahy Scale: Poor Standing balance comment: reliant on BUEs for support in standing.                              Pertinent Vitals/Pain Pain Assessment: Faces Faces Pain Scale: No hurt    Home Living Family/patient expects to be discharged to:: Skilled nursing facility                      Prior Function Level of Independence: Needs assistance   Gait / Transfers Assistance Needed: Reports using RW PTA     Comments: pt not able to provide reliable information regarding PLOF/history due to dementia.      Hand Dominance        Extremity/Trunk Assessment   Upper Extremity Assessment Upper Extremity Assessment: Defer to OT evaluation    Lower Extremity Assessment Lower Extremity Assessment: Overall WFL for tasks assessed    Cervical / Trunk Assessment Cervical / Trunk Assessment: Kyphotic  Communication   Communication: Expressive difficulties  Cognition Arousal/Alertness: Awake/alert Behavior During Therapy: WFL for tasks assessed/performed Overall Cognitive Status: No family/caregiver present to determine baseline cognitive functioning  General Comments: Anticipate pt may be close to cognitive baseline. Oriented to self only. Words are nonsensical at times. Follows 1-2 step commands consistently.      General Comments General comments (skin integrity, edema, etc.): Pt found saturated in urine- no awareness and not able to  state this. Cleaned up and RN notified.     Exercises     Assessment/Plan    PT Assessment Patient needs continued PT services  PT Problem List Decreased strength;Decreased mobility;Decreased cognition;Decreased balance;Decreased knowledge of use of DME       PT Treatment Interventions Functional mobility training;Balance training;Patient/family education;Gait training;Therapeutic exercise;Cognitive remediation;Therapeutic activities;DME instruction    PT Goals (Current goals can be found in the Care Plan section)  Acute Rehab PT Goals Patient Stated Goal: none stated as pt unable PT Goal Formulation: Patient unable to participate in goal setting Time For Goal Achievement: 07/06/18 Potential to Achieve Goals: Fair    Frequency Min 2X/week   Barriers to discharge        Co-evaluation               AM-PAC PT "6 Clicks" Mobility  Outcome Measure Help needed turning from your back to your side while in a flat bed without using bedrails?: A Little Help needed moving from lying on your back to sitting on the side of a flat bed without using bedrails?: A Little Help needed moving to and from a bed to a chair (including a wheelchair)?: A Little Help needed standing up from a chair using your arms (e.g., wheelchair or bedside chair)?: A Little Help needed to walk in hospital room?: A Little Help needed climbing 3-5 steps with a railing? : A Lot 6 Click Score: 17    End of Session Equipment Utilized During Treatment: Gait belt Activity Tolerance: Patient tolerated treatment well Patient left: in chair;with call bell/phone within reach;with chair alarm set;with nursing/sitter in English as a second language teacherroom(tele sitter) Nurse Communication: Mobility status PT Visit Diagnosis: Difficulty in walking, not elsewhere classified (R26.2);Muscle weakness (generalized) (M62.81)    Time: [id number] PT Time Calculation (min) (ACUTE ONLY): 27 min   Charges:   PT Evaluation $PT Eval Moderate Complexity: 1  Mod PT Treatments $Gait Training: 8-22 mins        Mylo RedShauna Nuriyah Hanline, PT, DPT Acute Rehabilitation Services Pager [phone] Office [phone]      Penny Carr 06/22/2018, 4:02 PM

## 2018-06-23 ENCOUNTER — Encounter (HOSPITAL_COMMUNITY): Payer: Self-pay

## 2018-06-23 LAB — BASIC METABOLIC PANEL
ANION GAP: 8 (ref 5–15)
BUN: 14 mg/dL (ref 8–23)
CO2: 28 mmol/L (ref 22–32)
Calcium: 9.6 mg/dL (ref 8.9–10.3)
Chloride: 107 mmol/L (ref 98–111)
Creatinine, Ser: 0.87 mg/dL (ref 0.44–1.00)
GFR calc Af Amer: >60 mL/min (ref >60)
GFR calc non Af Amer: >60 mL/min (ref >60)
Glucose, Bld: 103 mg/dL — ABNORMAL HIGH (ref 70–99)
Potassium: 4.1 mmol/L (ref 3.5–5.1)
Sodium: 143 mmol/L (ref 135–145)

## 2018-06-23 LAB — GLUCOSE, CAPILLARY
GLUCOSE-CAPILLARY: 109 mg/dL — AB (ref 70–99)
Glucose-Capillary: 75 mg/dL (ref 70–99)
Glucose-Capillary: 92 mg/dL (ref 70–99)
Glucose-Capillary: 166 mg/dL — ABNORMAL HIGH (ref 70–99)

## 2018-06-23 LAB — CULTURE, BLOOD (ROUTINE X 2): Special Requests: ADEQUATE

## 2018-06-23 NOTE — Progress Notes (Signed)
   06/23/18 0100  Provider Notification  Provider Name/Title Schorr  Date Provider Notified 06/23/18  Time Provider Notified 0100  Notification Type Page  Notification Reason Change in status   Pt heart rate sustaining in 30s.  Pt is alert appears to be asymptomatic but hard to tell due to a baseline of confusion.

## 2018-06-23 NOTE — Clinical Social Work Note (Signed)
Per RN, patient is from [facility]. Tried calling son but female that answered the phone said he was not there. Will try again later.  Charlynn Court, CSW [phone]

## 2018-06-23 NOTE — Progress Notes (Addendum)
PROGRESS NOTE                                                                                                                                                                                                             Patient Demographics:    Penny Carr, is a 73 y.o. female, DOB - September 05, 1945, ZOX:[id number]  Admit date - 06/20/2018   Admitting Physician [name], DO  Outpatient Primary MD for the patient is Samuel Jester, DO  LOS - 3   Chief Complaint  Patient presents with  . Leg Pain       Brief Narrative   73 year old female with past medical history of dementia, lives at SNF, presents with facility for complaining of bilateral lower extremity pain, she was noted to be bradycardic, hypotensive, she was admitted to ICU, required pressor support briefly, is to be improving, transferred to [facility] 06/22/2018.    Subjective:  -Was bradycardic overnight but asymptomatic, no significant complaints    Assessment  & Plan :   Hypotension,  bradycardia, hypothermia -No clear etiology, no evidence of infectious process, negative blood cultures, UA and urine cultures, process on CT chest, abdomen and pelvis. -Recent TSH within normal limits -Admitted to ICU initially, was on broad-spectrum antibiotics and pressors, has been weaned off pressors, currently off antibiotics. -Blood pressure and temperature has improved, heart rate remains low especially at night due to high vagal tone but asymptomatic, stop aricept, continue to monitor  Elevated troponins -Secondary to demand ischemia, was seen by cardiology, echocardiogram noted normal EF, no wall motion abnormalities, no further work-up recommended at this time stress test recommended as outpatient and cardiology has signed off   Dementia/delirium/depression -Restarted on home regimen of Zyprexa Namenda Lexapro and Aricept yesterday  -hold aricept as this can cause AV effects -PT/OT  eval  Hypertension: -restarted home lisinopril  Bradycardia:  -Cardiology input greatly appreciated, not on any AV nodal agents, new with telemetry monitoring, , no indication for pacemaker at this time . -stop ariept, heart rate is better in the daytime  Mild leg cellulitis versus chronic venous stasis changes  -Was on broad-spectrum antibiotics in the ICU, cultures are negative, currently on doxycycline  -We will stop this after total 5 days   History of DM2 -continue with insulin sliding scale  Code Status : Full  Family  Communication  : none at base line  Disposition Plan  : back to SNF when at baseline  Consults  :  PCCM  Procedures  : none  DVT Prophylaxis  : [place] heparin  Lab Results  Component Value Date   PLT 143 (L) 06/20/2018    Antibiotics  :    Anti-infectives (From admission, onward)   Start     Dose/Rate Route Frequency Ordered Stop   06/21/18 2200  vancomycin (VANCOCIN) 1,250 mg in sodium chloride 0.9 % 250 mL IVPB  Status:  Discontinued     1,250 mg 166.7 mL/hr over 90 Minutes Intravenous Every 24 hours 06/20/18 1906 06/20/18 1907   06/21/18 2200  vancomycin (VANCOCIN) IVPB 1000 mg/200 mL premix  Status:  Discontinued     1,000 mg 200 mL/hr over 60 Minutes Intravenous Every 24 hours 06/20/18 1907 06/21/18 0936   06/21/18 1000  doxycycline (VIBRA-TABS) tablet 100 mg     100 mg Oral Every 12 hours 06/21/18 0936 06/28/18 0959   06/21/18 0600  ceFEPIme (MAXIPIME) 2 g in sodium chloride 0.9 % 100 mL IVPB  Status:  Discontinued     2 g 200 mL/hr over 30 Minutes Intravenous Every 12 hours 06/20/18 1906 06/21/18 0936   06/20/18 2000  vancomycin (VANCOCIN) 2,000 mg in sodium chloride 0.9 % 500 mL IVPB  Status:  Discontinued     2,000 mg 250 mL/hr over 120 Minutes Intravenous  Once 06/20/18 1906 06/20/18 1907   06/20/18 2000  vancomycin (VANCOCIN) 2,000 mg in sodium chloride 0.9 % 500 mL IVPB     2,000 mg 250 mL/hr over 120 Minutes Intravenous  Once  06/20/18 1907 06/21/18 0000   06/20/18 1900  vancomycin (VANCOCIN) IVPB 1000 mg/200 mL premix  Status:  Discontinued     1,000 mg 200 mL/hr over 60 Minutes Intravenous Every 12 hours 06/20/18 1845 06/20/18 1905   06/20/18 1715  ceFEPIme (MAXIPIME) 2 g in sodium chloride 0.9 % 100 mL IVPB     2 g 200 mL/hr over 30 Minutes Intravenous  Once 06/20/18 1714 06/20/18 1908   06/20/18 1715  metroNIDAZOLE (FLAGYL) IVPB 500 mg  Status:  Discontinued     500 mg 100 mL/hr over 60 Minutes Intravenous Every 8 hours 06/20/18 1714 06/21/18 0936        Objective:   Vitals:   06/23/18 0556 06/23/18 0647 06/23/18 0945 06/23/18 1201  BP: (!) 143/100 140/71 (!) 143/52 (!) 129/52  Pulse:  (!) 53 (!) 51 (!) 47  Resp:   20   Temp:    98.3 F (36.8 C)  TempSrc:    Oral  SpO2:  98% 97% 93%  Weight:      Height:        Wt Readings from Last 3 Encounters:  06/23/18 113.4 kg  05/25/18 99.8 kg     Intake/Output Summary (Last 24 hours) at 06/23/2018 1208 Last data filed at 06/23/2018 1059 Gross per 24 hour  Intake 1089.31 ml  Output 400 ml  Net 689.31 ml   Gen: Elderly frail chronically ill female, laying in bed, alert awake oriented to self only HEENT: PERRLA, Neck supple, no JVD Lungs: Clear bilaterally, decreased at both bases  CVS: RRR,No Gallops,Rubs or new Murmurs Abd: soft, Non tender, non distended, BS present Extremities: No edema Skin: no new rashes   Data Review:    CBC Recent Labs  Lab 06/20/18 1336  WBC 5.9  HGB 11.5*  HCT 38.4  PLT 143*  MCV  98.2  MCH 29.4  MCHC 29.9*  RDW 16.5*  LYMPHSABS 2.4  MONOABS 0.6  EOSABS 0.1  BASOSABS 0.0    Chemistries  Recent Labs  Lab 06/20/18 1336 06/22/18 0523 06/23/18 0419  NA 144 144 143  K 4.4 4.0 4.1  CL 106 105 107  CO2 32 33* 28  GLUCOSE 107* 101* 103*  BUN 26* 12 14  CREATININE 1.01* 0.81 0.87  CALCIUM 9.9 9.9 9.6  AST 18  --   --   ALT 16  --   --   ALKPHOS 56  --   --   BILITOT 0.4  --   --     ------------------------------------------------------------------------------------------------------------------ No results for input(s): CHOL, HDL, LDLCALC, TRIG, CHOLHDL, LDLDIRECT in the last 72 hours.  Lab Results  Component Value Date   HGBA1C  10/18/2008    5.9 (NOTE) The ADA recommends the following therapeutic goal for glycemic control related to Hgb A1c measurement: Goal of therapy: <6.5 Hgb A1c  Reference: American Diabetes Association: Clinical Practice Recommendations 2010, Diabetes Care, 2010, 33: (Suppl  1).   ------------------------------------------------------------------------------------------------------------------ Recent Labs    06/20/18 1634  TSH 4.132  T4TOTAL 6.0   ------------------------------------------------------------------------------------------------------------------ No results for input(s): VITAMINB12, FOLATE, FERRITIN, TIBC, IRON, RETICCTPCT in the last 72 hours.  Coagulation profile No results for input(s): INR, PROTIME in the last 168 hours.  No results for input(s): DDIMER in the last 72 hours.  Cardiac Enzymes Recent Labs  Lab 06/20/18 2323 06/21/18 0454 06/21/18 1113  TROPONINI 0.20* 0.15* 0.17*   ------------------------------------------------------------------------------------------------------------------    Component Value Date/Time   BNP 89.8 06/20/2018 1336    Inpatient Medications  Scheduled Meds: . divalproex  250 mg Oral Q12H  . donepezil  10 mg Oral QHS  . doxycycline  100 mg Oral Q12H  . escitalopram  10 mg Oral Daily  . heparin  5,000 Units Subcutaneous Q8H  . insulin aspart  0-15 Units Subcutaneous TID WC  . insulin aspart  0-5 Units Subcutaneous QHS  . mouth rinse  15 mL Mouth Rinse BID  . memantine  28 mg Oral Daily  . OLANZapine  2.5 mg Oral QHS   Continuous Infusions: PRN Meds:.  Micro Results Recent Results (from the past 240 hour(s))  Blood Culture (routine x 2)     Status: Abnormal    Collection Time: 06/20/18  5:15 PM  Result Value Ref Range Status   Specimen Description   Final    BLOOD LEFT FOREARM Performed at [facility], [address], [place], [place] [address]    Special Requests   Final    BOTTLES DRAWN AEROBIC AND ANAEROBIC Blood Culture adequate volume Performed at [facility], [address], [place], [place] [address]    Culture  Setup Time   Final    GRAM POSITIVE RODS AEROBIC BOTTLE ONLY CRITICAL RESULT CALLED TO, READ BACK BY AND VERIFIED WITH: PHARMS T DANG [phone] MLM    Culture (A)  Final    DIPHTHEROIDS(CORYNEBACTERIUM SPECIES) Standardized susceptibility testing for this organism is not available. Performed at [facility], [address] [place], [place] [address]    Report Status 06/23/2018 FINAL  Final  Blood Culture (routine x 2)     Status: None (Preliminary result)   Collection Time: 06/20/18  5:19 PM  Result Value Ref Range Status   Specimen Description   Final    BLOOD RIGHT HAND Performed at [facility], [address] [place], [place] [address]    Special Requests   Final    BOTTLES DRAWN AEROBIC ONLY Blood Culture results may not be optimal due to an inadequate volume of blood received in culture bottles Performed at [facility], [address], [place], [place] [address]    Culture   Final    NO GROWTH 2 DAYS Performed at [facility], [address] [place], [place] [address]    Report Status PENDING  Incomplete  Urine culture     Status: None   Collection Time: 06/20/18  7:44 PM  Result Value Ref Range Status   Specimen Description   Final    URINE, CATHETERIZED Performed at [facility], [address], [place], [place] [address]    Special Requests   Final    NONE Performed at [facility], [address], [place], [place] [address]    Culture   Final    NO GROWTH Performed  at [facility], [address], [place], [place] [address]    Report Status 06/22/2018 FINAL  Final  MRSA PCR Screening     Status: None   Collection Time: 06/20/18  9:30 PM  Result Value Ref Range Status   MRSA by PCR NEGATIVE NEGATIVE Final    Comment:        The GeneXpert MRSA Assay (FDA approved for NASAL specimens only), is one component of a comprehensive MRSA colonization surveillance program. It is not intended to diagnose MRSA infection nor to guide or monitor treatment for MRSA infections. Performed at [facility], [address], [place], [place] [address]     Radiology Reports Dg Chest 2 View  Result Date: 05/25/2018 CLINICAL DATA:  Fall.  Chest pain EXAM: CHEST - 2 VIEW COMPARISON:  03/26/2010 FINDINGS: Mild cardiac enlargement. Negative for heart failure. Negative for infiltrate effusion or mass. IMPRESSION: No active cardiopulmonary disease. Electronically Signed   By: Marlan Palau M.D.   On: 05/25/2018 13:20   Ct Head Wo Contrast  Result Date: 06/20/2018 CLINICAL DATA:  73 year old female with altered mental status. EXAM: CT HEAD WITHOUT CONTRAST TECHNIQUE: Contiguous axial images were obtained from the base of the skull through the vertex without intravenous contrast. COMPARISON:  05/25/2018 CT FINDINGS: Brain: No evidence of acute infarction, hemorrhage, hydrocephalus, extra-axial collection or mass lesion/mass effect. Atrophy and chronic small-vessel white matter ischemic changes again noted. Vascular: No hyperdense vessel or unexpected calcification. Skull: Normal. Negative for fracture or focal lesion. Sinuses/Orbits: No acute finding. Other: None. IMPRESSION: 1. No evidence of acute intracranial abnormality. 2. Atrophy and chronic small-vessel white matter ischemic changes. Electronically Signed   By: Harmon Pier M.D.   On: 06/20/2018 20:41   Ct Head Wo Contrast  Result Date: 05/25/2018 CLINICAL DATA:  Unwitnessed fall. EXAM: CT HEAD WITHOUT  CONTRAST CT CERVICAL SPINE WITHOUT CONTRAST TECHNIQUE: Multidetector CT imaging of the head and cervical spine was performed following the standard protocol without intravenous contrast. Multiplanar CT image reconstructions of the cervical spine were also generated. COMPARISON:  None. FINDINGS: CT HEAD FINDINGS Brain: No subdural, epidural, or subarachnoid hemorrhage. Cerebellum, brainstem, and basal cisterns are normal. Ventricles and sulci are unremarkable. Tiny lacunar infarct in the anterior limb of the right internal capsule. Scattered white matter changes noted. No acute cortical ischemia or infarct. Ventricles and sulci are unremarkable. No mass effect or midline shift. Vascular: No hyperdense vessel or unexpected calcification. Skull: Normal. Negative for fracture or  focal lesion. Sinuses/Orbits: No acute finding. Other: There is a laceration over the left eye. The globe is intact extracranial soft tissues otherwise normal. CT CERVICAL SPINE FINDINGS Alignment: No traumatic malalignment. Mild reversal of normal lordosis centered at C5-6. Skull base and vertebrae: No acute fracture. No primary bone lesion or focal pathologic process. Soft tissues and spinal canal: Narrowing of the spinal canal at C5-6 due to a posterior osteophyte. The anterior diameter of the canal in this region is 6 mm. No other abnormalities identified. Disc levels:  Multilevel degenerative changes. Upper chest: Negative. Other: No other abnormalities. IMPRESSION: 1. Scattered white matter changes. Tiny age-indeterminate lacunar infarct in the anterior limb of the right internal capsule. No other acute intracranial abnormalities are noted. 2. Degenerative changes, most marked at C5-6. Narrowing of the spinal canal to 6 mm in AP diameter at C5-6 due to a posterior osteophyte. 3. No fracture or traumatic malalignment in the cervical spine. Electronically Signed   By: Gerome Samavid  Williams III M.D   On: 05/25/2018 15:05   Ct Angio Chest Pe W/cm  &/or Wo Cm  Result Date: 06/20/2018 CLINICAL DATA:  Hypothermia. Bradycardia. Bilateral lower extremity pain. Alzheimer disease. Sepsis. EXAM: CT ANGIOGRAPHY CHEST CT ABDOMEN AND PELVIS WITH CONTRAST TECHNIQUE: Multidetector CT imaging of the chest was performed using the standard protocol during bolus administration of intravenous contrast. Multiplanar CT image reconstructions and MIPs were obtained to evaluate the vascular anatomy. Multidetector CT imaging of the abdomen and pelvis was performed using the standard protocol during bolus administration of intravenous contrast. CONTRAST:  100mL ISOVUE-370 IOPAMIDOL (ISOVUE-370) INJECTION 76% COMPARISON:  Chest radiograph from earlier today. FINDINGS: CTA CHEST FINDINGS Significantly motion degraded scan, limiting assessment. Cardiovascular: The study is low quality for the evaluation of pulmonary embolism, limited by poor contrast opacification and by significant motion degradation. No central pulmonary emboli. The lobar, segmental and subsegmental pulmonary artery branches are poorly evaluated on this scan, with no convincing filling defects. Atherosclerotic nonaneurysmal thoracic aorta. Normal caliber pulmonary arteries. Mild cardiomegaly. No significant pericardial fluid/thickening. Left anterior descending coronary atherosclerosis. Mediastinum/Nodes: No discrete thyroid nodules. Unremarkable esophagus. No pathologically enlarged axillary, mediastinal or hilar lymph nodes. Lungs/Pleura: No pneumothorax. No pleural effusion. Mild hypoventilatory changes in the dependent lower lobes. No acute consolidative airspace disease, lung masses or significant pulmonary nodules. Musculoskeletal: No aggressive appearing focal osseous lesions. Marked thoracic spondylosis. Review of the MIP images confirms the above findings. CT ABDOMEN and PELVIS FINDINGS Hepatobiliary: Normal liver with no liver mass. Cholelithiasis. No gallbladder distention. No gallbladder wall  thickening. No pericholecystic fluid. No biliary ductal dilatation. Pancreas: Normal, with no mass or duct dilation. Spleen: Normal size. No mass. Adrenals/Urinary Tract: Left adrenal 2.5 cm mass with density 65 HU (series 7/image 22). No right adrenal nodules. Hydronephrosis. Simple 1.3 cm lower right renal cyst. No additional renal lesions. Bladder collapsed by indwelling Foley catheter and not well evaluated with no gross bladder abnormality. Stomach/Bowel: Small hiatal hernia. Otherwise normal nondistended stomach. Normal caliber small bowel with no small bowel wall thickening. Normal appendix. Moderate left colonic diverticulosis, with no large bowel wall thickening or acute pericolonic fat stranding. Vascular/Lymphatic: Atherosclerotic nonaneurysmal abdominal aorta. Patent portal, splenic, hepatic and renal veins. No pathologically enlarged lymph nodes in the abdomen or pelvis. Reproductive: Status post hysterectomy, with no abnormal findings at the vaginal cuff. No adnexal mass. Other: No pneumoperitoneum, ascites or focal fluid collection. Musculoskeletal: No aggressive appearing focal osseous lesions. Mild lumbar spondylosis. Review of the MIP images confirms the above findings.  IMPRESSION: 1. Technically very limited motion degraded scan. No central pulmonary emboli. 2. Mild cardiomegaly. 3. Mild hypoventilatory changes in the dependent lungs. Otherwise no active pulmonary disease. 4. No acute abnormality in the abdomen or pelvis. No evidence of bowel obstruction or acute bowel inflammation. Moderate left colonic diverticulosis, with no evidence of acute diverticulitis. 5. Cholelithiasis, with no CT findings of acute cholecystitis. 6. Indeterminate left adrenal mass, probably a benign adrenal adenoma. Follow-up adrenal mass protocol CT abdomen without and with IV contrast suggested in 12 months. This recommendation follows ACR consensus guidelines: Management of Incidental Adrenal Masses: A White Paper of  the ACR Incidental Findings Committee. J Am Coll Radiol 2017;14:1038-1044. 7. Small hiatal hernia. Aortic Atherosclerosis (ICD10-I70.0). Electronically Signed   By: Delbert Phenix M.D.   On: 06/20/2018 20:53   Ct Cervical Spine Wo Contrast  Result Date: 05/25/2018 CLINICAL DATA:  Unwitnessed fall. EXAM: CT HEAD WITHOUT CONTRAST CT CERVICAL SPINE WITHOUT CONTRAST TECHNIQUE: Multidetector CT imaging of the head and cervical spine was performed following the standard protocol without intravenous contrast. Multiplanar CT image reconstructions of the cervical spine were also generated. COMPARISON:  None. FINDINGS: CT HEAD FINDINGS Brain: No subdural, epidural, or subarachnoid hemorrhage. Cerebellum, brainstem, and basal cisterns are normal. Ventricles and sulci are unremarkable. Tiny lacunar infarct in the anterior limb of the right internal capsule. Scattered white matter changes noted. No acute cortical ischemia or infarct. Ventricles and sulci are unremarkable. No mass effect or midline shift. Vascular: No hyperdense vessel or unexpected calcification. Skull: Normal. Negative for fracture or focal lesion. Sinuses/Orbits: No acute finding. Other: There is a laceration over the left eye. The globe is intact extracranial soft tissues otherwise normal. CT CERVICAL SPINE FINDINGS Alignment: No traumatic malalignment. Mild reversal of normal lordosis centered at C5-6. Skull base and vertebrae: No acute fracture. No primary bone lesion or focal pathologic process. Soft tissues and spinal canal: Narrowing of the spinal canal at C5-6 due to a posterior osteophyte. The anterior diameter of the canal in this region is 6 mm. No other abnormalities identified. Disc levels:  Multilevel degenerative changes. Upper chest: Negative. Other: No other abnormalities. IMPRESSION: 1. Scattered white matter changes. Tiny age-indeterminate lacunar infarct in the anterior limb of the right internal capsule. No other acute intracranial  abnormalities are noted. 2. Degenerative changes, most marked at C5-6. Narrowing of the spinal canal to 6 mm in AP diameter at C5-6 due to a posterior osteophyte. 3. No fracture or traumatic malalignment in the cervical spine. Electronically Signed   By: Gerome Sam III M.D   On: 05/25/2018 15:05   Ct Abdomen Pelvis W Contrast  Result Date: 06/20/2018 CLINICAL DATA:  Hypothermia. Bradycardia. Bilateral lower extremity pain. Alzheimer disease. Sepsis. EXAM: CT ANGIOGRAPHY CHEST CT ABDOMEN AND PELVIS WITH CONTRAST TECHNIQUE: Multidetector CT imaging of the chest was performed using the standard protocol during bolus administration of intravenous contrast. Multiplanar CT image reconstructions and MIPs were obtained to evaluate the vascular anatomy. Multidetector CT imaging of the abdomen and pelvis was performed using the standard protocol during bolus administration of intravenous contrast. CONTRAST:  ISOVUE-370 IOPAMIDOL (ISOVUE-370) INJECTION 76% COMPARISON:  Chest radiograph from earlier today. FINDINGS: CTA CHEST FINDINGS Significantly motion degraded scan, limiting assessment. Cardiovascular: The study is low quality for the evaluation of pulmonary embolism, limited by poor contrast opacification and by significant motion degradation. No central pulmonary emboli. The lobar, segmental and subsegmental pulmonary artery branches are poorly evaluated on this scan, with no convincing filling defects. Atherosclerotic  nonaneurysmal thoracic aorta. Normal caliber pulmonary arteries. Mild cardiomegaly. No significant pericardial fluid/thickening. Left anterior descending coronary atherosclerosis. Mediastinum/Nodes: No discrete thyroid nodules. Unremarkable esophagus. No pathologically enlarged axillary, mediastinal or hilar lymph nodes. Lungs/Pleura: No pneumothorax. No pleural effusion. Mild hypoventilatory changes in the dependent lower lobes. No acute consolidative airspace disease, lung masses or  significant pulmonary nodules. Musculoskeletal: No aggressive appearing focal osseous lesions. Marked thoracic spondylosis. Review of the MIP images confirms the above findings. CT ABDOMEN and PELVIS FINDINGS Hepatobiliary: Normal liver with no liver mass. Cholelithiasis. No gallbladder distention. No gallbladder wall thickening. No pericholecystic fluid. No biliary ductal dilatation. Pancreas: Normal, with no mass or duct dilation. Spleen: Normal size. No mass. Adrenals/Urinary Tract: Left adrenal 2.5 cm mass with density 65 HU (series 7/image 22). No right adrenal nodules. Hydronephrosis. Simple 1.3 cm lower right renal cyst. No additional renal lesions. Bladder collapsed by indwelling Foley catheter and not well evaluated with no gross bladder abnormality. Stomach/Bowel: Small hiatal hernia. Otherwise normal nondistended stomach. Normal caliber small bowel with no small bowel wall thickening. Normal appendix. Moderate left colonic diverticulosis, with no large bowel wall thickening or acute pericolonic fat stranding. Vascular/Lymphatic: Atherosclerotic nonaneurysmal abdominal aorta. Patent portal, splenic, hepatic and renal veins. No pathologically enlarged lymph nodes in the abdomen or pelvis. Reproductive: Status post hysterectomy, with no abnormal findings at the vaginal cuff. No adnexal mass. Other: No pneumoperitoneum, ascites or focal fluid collection. Musculoskeletal: No aggressive appearing focal osseous lesions. Mild lumbar spondylosis. Review of the MIP images confirms the above findings. IMPRESSION: 1. Technically very limited motion degraded scan. No central pulmonary emboli. 2. Mild cardiomegaly. 3. Mild hypoventilatory changes in the dependent lungs. Otherwise no active pulmonary disease. 4. No acute abnormality in the abdomen or pelvis. No evidence of bowel obstruction or acute bowel inflammation. Moderate left colonic diverticulosis, with no evidence of acute diverticulitis. 5. Cholelithiasis,  with no CT findings of acute cholecystitis. 6. Indeterminate left adrenal mass, probably a benign adrenal adenoma. Follow-up adrenal mass protocol CT abdomen without and with IV contrast suggested in 12 months. This recommendation follows ACR consensus guidelines: Management of Incidental Adrenal Masses: A White Paper of the ACR Incidental Findings Committee. J Am Coll Radiol 2017;14:1038-1044. 7. Small hiatal hernia. Aortic Atherosclerosis (ICD10-I70.0). Electronically Signed   By: Delbert Phenix M.D.   On: 06/20/2018 20:53   Dg Chest Port 1 View  Result Date: 06/21/2018 CLINICAL DATA:  Follow-up vascular congestion. EXAM: PORTABLE CHEST 1 VIEW COMPARISON:  CT 06/20/2018.  Chest x-ray 06/20/2018. FINDINGS: Stable cardiomegaly. Improved pulmonary venous congestion. No focal infiltrate. No pleural effusion or pneumothorax. No acute bony abnormality. IMPRESSION: Stable cardiomegaly. Improved pulmonary venous congestion. No acute pulmonary disease. Electronically Signed   By: Maisie Fus  Register   On: 06/21/2018 05:24   Dg Chest Portable 1 View  Result Date: 06/20/2018 CLINICAL DATA:  Altered mental status EXAM: PORTABLE CHEST 1 VIEW COMPARISON:  05/25/2018, 03/26/2010 FINDINGS: Low lung volumes. Enlarged cardiomediastinal silhouette with mild central vascular congestion. No acute consolidation. No pneumothorax. IMPRESSION: Low lung volumes.  Cardiomegaly with central vascular congestion. Electronically Signed   By: Jasmine Pang M.D.   On: 06/20/2018 16:01      Zannie Cove M.D on 06/23/2018 at 12:08 PM  Page via [email or web address]  After 7pm go to [email or web address] - password [facility]  [facility] -  Office  [phone]

## 2018-06-23 NOTE — Care Management Important Message (Signed)
Important Message  Patient Details  Name: Penny Carr MRN: [id number] Date of Birth: Jul 04, 1945   Medicare Important Message Given:  Yes    Oralia Rud Elius Etheredge 06/23/2018, 4:21 PM

## 2018-06-23 NOTE — Plan of Care (Signed)

## 2018-06-23 NOTE — Care Management Important Message (Signed)
Important Message  Patient Details  Name: Penny Carr MRN: [id number] Date of Birth: Dec 24, 1945   Medicare Important Message Given:  Yes Patient unable to sign, unsigned copy left at bedside   Orson Aloe 06/23/2018, 4:46 PM

## 2018-06-23 NOTE — Progress Notes (Signed)
   06/23/18 0200  Pain Assessment  Pain Scale Faces  Faces Pain Scale 0  Provider Notification  Provider Name/Title Schorr, NP  Date Provider Notified 06/23/18  Time Provider Notified [phone]  Notification Type Page  Notification Reason Change in status  Response No new orders  Date of Provider Response 06/23/18  Time of Provider Response [phone]  Paged about patient heart rate dropping below 30 and staying in 30s.  Patient also having some pauses.  Suggested to monitor patient closely and page again if heart rate sustains in the 20s.

## 2018-06-23 NOTE — Progress Notes (Signed)
Received call from Tiffany with [facility]; patient is active with them for [facility] as prior to admission; B Ave Filter RN,MHA,BSN

## 2018-06-24 LAB — GLUCOSE, CAPILLARY
Glucose-Capillary: 89 mg/dL (ref 70–99)
Glucose-Capillary: 119 mg/dL — ABNORMAL HIGH (ref 70–99)
Glucose-Capillary: 133 mg/dL — ABNORMAL HIGH (ref 70–99)
Glucose-Capillary: 117 mg/dL — ABNORMAL HIGH (ref 70–99)

## 2018-06-24 NOTE — Progress Notes (Signed)
PROGRESS NOTE                                                                                                                                                                                                             Patient Demographics:    Penny MorosDorretta Barna, is a 73 y.o. female, DOB - 01/05/1946, ZOX:[id number]  Admit date - 06/20/2018   Admitting Physician Josephine IgoBradley L Icard, DO  Outpatient Primary MD for the patient is Samuel JesterButler, Cynthia, DO  LOS - 4   Chief Complaint  Patient presents with  . Leg Pain       Brief Narrative   73 year old female with past medical history of dementia, lives at SNF, presents with facility for complaining of bilateral lower extremity pain, she was noted to be bradycardic, hypotensive, she was admitted to ICU, required pressor support briefly, is to be improving, transferred to [facility] 06/22/2018.    Subjective:  -Feels better, no complaints, early morning had a 3-second pause-asymptomatic at this time -Rate better, in the 40-50 range now   Assessment  & Plan :   Hypotension,  bradycardia, hypothermia -No clear etiology, no evidence of infectious process, negative blood cultures, UA and urine cultures, process on CT chest, abdomen and pelvis. -Recent TSH within normal limits -Admitted to ICU initially, was on broad-spectrum antibiotics and pressors, has been weaned off pressors, currently off antibiotics. -Blood pressure and temperature has improved, heart rate remains low especially at night due to high vagal tone but asymptomatic, I discontinued her Aricept yesterday however her last dose was 1/1 night which could have had some effects lingering onto last night, monitor for 24 hours on telemetry -seen by cardiology as well  Elevated troponins -Secondary to demand ischemia, was seen by cardiology, echocardiogram noted normal EF, no wall motion abnormalities, no further work-up recommended at this time stress test  recommended as outpatient and cardiology has signed off   Dementia/delirium/depression -Restarted on home regimen of Zyprexa Namenda Lexapro and Aricept yesterday  -Discontinued aricept as this can cause AV effects -PT/OT eval  Hypertension: -restarted home lisinopril  Bradycardia:  -Cardiology input greatly appreciated, not on any AV nodal agents, new with telemetry monitoring, , no indication for pacemaker at this time . -stopped aricept, heart rate is better in the daytime  Mild leg cellulitis versus chronic venous stasis changes  -  Was on broad-spectrum antibiotics in the ICU, cultures are negative, currently on doxycycline  -Appears more like venous stasis changes at this time, discontinue doxycycline tomorrow  History of DM2 -continue with insulin sliding scale  Code Status : Full  Family Communication  : none at base line  Disposition Plan  : back to SNF when at baseline  Consults  :  PCCM  Procedures  : none  DVT Prophylaxis  : [place] heparin  Lab Results  Component Value Date   PLT 143 (L) 06/20/2018    Antibiotics  :    Anti-infectives (From admission, onward)   Start     Dose/Rate Route Frequency Ordered Stop   06/21/18 2200  vancomycin (VANCOCIN) 1,250 mg in sodium chloride 0.9 % 250 mL IVPB  Status:  Discontinued     1,250 mg 166.7 mL/hr over 90 Minutes Intravenous Every 24 hours 06/20/18 1906 06/20/18 1907   06/21/18 2200  vancomycin (VANCOCIN) IVPB 1000 mg/200 mL premix  Status:  Discontinued     1,000 mg 200 mL/hr over 60 Minutes Intravenous Every 24 hours 06/20/18 1907 06/21/18 0936   06/21/18 1000  doxycycline (VIBRA-TABS) tablet 100 mg     100 mg Oral Every 12 hours 06/21/18 0936 06/28/18 0959   06/21/18 0600  ceFEPIme (MAXIPIME) 2 g in sodium chloride 0.9 % 100 mL IVPB  Status:  Discontinued     2 g 200 mL/hr over 30 Minutes Intravenous Every 12 hours 06/20/18 1906 06/21/18 0936   06/20/18 2000  vancomycin (VANCOCIN) 2,000 mg in sodium  chloride 0.9 % 500 mL IVPB  Status:  Discontinued     2,000 mg 250 mL/hr over 120 Minutes Intravenous  Once 06/20/18 1906 06/20/18 1907   06/20/18 2000  vancomycin (VANCOCIN) 2,000 mg in sodium chloride 0.9 % 500 mL IVPB     2,000 mg 250 mL/hr over 120 Minutes Intravenous  Once 06/20/18 1907 06/21/18 0000   06/20/18 1900  vancomycin (VANCOCIN) IVPB 1000 mg/200 mL premix  Status:  Discontinued     1,000 mg 200 mL/hr over 60 Minutes Intravenous Every 12 hours 06/20/18 1845 06/20/18 1905   06/20/18 1715  ceFEPIme (MAXIPIME) 2 g in sodium chloride 0.9 % 100 mL IVPB     2 g 200 mL/hr over 30 Minutes Intravenous  Once 06/20/18 1714 06/20/18 1908   06/20/18 1715  metroNIDAZOLE (FLAGYL) IVPB 500 mg  Status:  Discontinued     500 mg 100 mL/hr over 60 Minutes Intravenous Every 8 hours 06/20/18 1714 06/21/18 0936        Objective:   Vitals:   06/23/18 2016 06/24/18 0359 06/24/18 0958 06/24/18 1208  BP: (!) 118/57 (!) 135/57 (!) 130/53 (!) 149/47  Pulse: (!) 50 (!) 48 68 (!) 51  Resp: 18 18 18 16   Temp: 98.6 F (37 C) 97.7 F (36.5 C)  98 F (36.7 C)  TempSrc: Oral Oral  Oral  SpO2: 97% 100%  96%  Weight:  115.1 kg    Height:        Wt Readings from Last 3 Encounters:  06/24/18 115.1 kg  05/25/18 99.8 kg     Intake/Output Summary (Last 24 hours) at 06/24/2018 1428 Last data filed at 06/24/2018 0955 Gross per 24 hour  Intake 480 ml  Output -  Net 480 ml   Gen: Elderly frail chronically ill-appearing female, laying in bed, oriented to self only awake, Alert HEENT: PERRLA, Neck supple, no JVD Lungs: Good air movement, clear anteriorly CVS: RRR,No  Gallops,Rubs or new Murmurs Abd: soft, Non tender, non distended, BS present Extremities: Bilateral chronic venous stasis changes Skin: no new rashes    Data Review:    CBC Recent Labs  Lab 06/20/18 1336  WBC 5.9  HGB 11.5*  HCT 38.4  PLT 143*  MCV 98.2  MCH 29.4  MCHC 29.9*  RDW 16.5*  LYMPHSABS 2.4  MONOABS 0.6    EOSABS 0.1  BASOSABS 0.0    Chemistries  Recent Labs  Lab 06/20/18 1336 06/22/18 0523 06/23/18 0419  NA 144 144 143  K 4.4 4.0 4.1  CL 106 105 107  CO2 32 33* 28  GLUCOSE 107* 101* 103*  BUN 26* 12 14  CREATININE 1.01* 0.81 0.87  CALCIUM 9.9 9.9 9.6  AST 18  --   --   ALT 16  --   --   ALKPHOS 56  --   --   BILITOT 0.4  --   --    ------------------------------------------------------------------------------------------------------------------ No results for input(s): CHOL, HDL, LDLCALC, TRIG, CHOLHDL, LDLDIRECT in the last 72 hours.  Lab Results  Component Value Date   HGBA1C  10/18/2008    5.9 (NOTE) The ADA recommends the following therapeutic goal for glycemic control related to Hgb A1c measurement: Goal of therapy: <6.5 Hgb A1c  Reference: American Diabetes Association: Clinical Practice Recommendations 2010, Diabetes Care, 2010, 33: (Suppl  1).   ------------------------------------------------------------------------------------------------------------------ No results for input(s): TSH, T4TOTAL, T3FREE, THYROIDAB in the last 72 hours.  Invalid input(s): FREET3 ------------------------------------------------------------------------------------------------------------------ No results for input(s): VITAMINB12, FOLATE, FERRITIN, TIBC, IRON, RETICCTPCT in the last 72 hours.  Coagulation profile No results for input(s): INR, PROTIME in the last 168 hours.  No results for input(s): DDIMER in the last 72 hours.  Cardiac Enzymes Recent Labs  Lab 06/20/18 2323 06/21/18 0454 06/21/18 1113  TROPONINI 0.20* 0.15* 0.17*   ------------------------------------------------------------------------------------------------------------------    Component Value Date/Time   BNP 89.8 06/20/2018 1336    Inpatient Medications  Scheduled Meds: . divalproex  250 mg Oral Q12H  . doxycycline  100 mg Oral Q12H  . escitalopram  10 mg Oral Daily  . heparin  5,000 Units  Subcutaneous Q8H  . insulin aspart  0-15 Units Subcutaneous TID WC  . insulin aspart  0-5 Units Subcutaneous QHS  . mouth rinse  15 mL Mouth Rinse BID  . memantine  28 mg Oral Daily  . OLANZapine  2.5 mg Oral QHS   Continuous Infusions: PRN Meds:.  Micro Results Recent Results (from the past 240 hour(s))  Blood Culture (routine x 2)     Status: Abnormal   Collection Time: 06/20/18  5:15 PM  Result Value Ref Range Status   Specimen Description   Final    BLOOD LEFT FOREARM Performed at [facility], [address] [place], [place] [address]    Special Requests   Final    BOTTLES DRAWN AEROBIC AND ANAEROBIC Blood Culture adequate volume Performed at [facility], [address] [place], [place] [address]    Culture  Setup Time   Final    GRAM POSITIVE RODS AEROBIC BOTTLE ONLY CRITICAL RESULT CALLED TO, READ BACK BY AND VERIFIED WITH: PHARMS T DANG [phone] MLM    Culture (A)  Final    DIPHTHEROIDS(CORYNEBACTERIUM SPECIES) Standardized susceptibility testing for this organism is not available. Performed at [facility], [address] [place], [place] [address]    Report Status 06/23/2018 FINAL  Final  Blood Culture (routine  x 2)     Status: None (Preliminary result)   Collection Time: 06/20/18  5:19 PM  Result Value Ref Range Status   Specimen Description   Final    BLOOD RIGHT HAND Performed at [facility], [address] [place], [place] [address]    Special Requests   Final    BOTTLES DRAWN AEROBIC ONLY Blood Culture results may not be optimal due to an inadequate volume of blood received in culture bottles Performed at [facility], [address] [place], [place] [address]    Culture   Final    NO GROWTH 4 DAYS Performed at [facility], [address] [place], [place] [address]    Report Status PENDING  Incomplete  Urine culture     Status: None   Collection Time:  06/20/18  7:44 PM  Result Value Ref Range Status   Specimen Description   Final    URINE, CATHETERIZED Performed at [facility], [address] [place], [place] [address]    Special Requests   Final    NONE Performed at [facility], [address] [place], [place] [address]    Culture   Final    NO GROWTH Performed at [facility], [address] [place], [place] [address]    Report Status 06/22/2018 FINAL  Final  MRSA PCR Screening     Status: None   Collection Time: 06/20/18  9:30 PM  Result Value Ref Range Status   MRSA by PCR NEGATIVE NEGATIVE Final    Comment:        The GeneXpert MRSA Assay (FDA approved for NASAL specimens only), is one component of a comprehensive MRSA colonization surveillance program. It is not intended to diagnose MRSA infection nor to guide or monitor treatment for MRSA infections. Performed at [facility], [address] [place], [place] [address]     Radiology Reports Ct Head Wo Contrast  Result Date: 06/20/2018 CLINICAL DATA:  73 year old female with altered mental status. EXAM: CT HEAD WITHOUT CONTRAST TECHNIQUE: Contiguous axial images were obtained from the base of the skull through the vertex without intravenous contrast. COMPARISON:  05/25/2018 CT FINDINGS: Brain: No evidence of acute infarction, hemorrhage, hydrocephalus, extra-axial collection or mass lesion/mass effect. Atrophy and chronic small-vessel white matter ischemic changes again noted. Vascular: No hyperdense vessel or unexpected calcification. Skull: Normal. Negative for fracture or focal lesion. Sinuses/Orbits: No acute finding. Other: None. IMPRESSION: 1. No evidence of acute intracranial abnormality. 2. Atrophy and chronic small-vessel white matter ischemic changes. Electronically Signed   By: Harmon PierJeffrey  Hu M.D.   On: 06/20/2018 20:41   Ct Head Wo Contrast  Result Date: 05/25/2018 CLINICAL DATA:  Unwitnessed fall. EXAM:  CT HEAD WITHOUT CONTRAST CT CERVICAL SPINE WITHOUT CONTRAST TECHNIQUE: Multidetector CT imaging of the head and cervical spine was performed following the standard protocol without intravenous contrast. Multiplanar CT image reconstructions of the cervical spine were also generated. COMPARISON:  None. FINDINGS: CT HEAD FINDINGS Brain: No subdural, epidural, or subarachnoid hemorrhage. Cerebellum, brainstem, and basal cisterns are normal. Ventricles and sulci are unremarkable. Tiny lacunar infarct in the anterior limb of the right internal capsule. Scattered white matter changes noted. No acute cortical ischemia or infarct. Ventricles and sulci are unremarkable. No mass effect or midline shift. Vascular: No hyperdense vessel or unexpected calcification. Skull: Normal. Negative for fracture or focal lesion. Sinuses/Orbits: No acute finding. Other: There is a laceration over  the left eye. The globe is intact extracranial soft tissues otherwise normal. CT CERVICAL SPINE FINDINGS Alignment: No traumatic malalignment. Mild reversal of normal lordosis centered at C5-6. Skull base and vertebrae: No acute fracture. No primary bone lesion or focal pathologic process. Soft tissues and spinal canal: Narrowing of the spinal canal at C5-6 due to a posterior osteophyte. The anterior diameter of the canal in this region is 6 mm. No other abnormalities identified. Disc levels:  Multilevel degenerative changes. Upper chest: Negative. Other: No other abnormalities. IMPRESSION: 1. Scattered white matter changes. Tiny age-indeterminate lacunar infarct in the anterior limb of the right internal capsule. No other acute intracranial abnormalities are noted. 2. Degenerative changes, most marked at C5-6. Narrowing of the spinal canal to 6 mm in AP diameter at C5-6 due to a posterior osteophyte. 3. No fracture or traumatic malalignment in the cervical spine. Electronically Signed   By: Gerome Sam III M.D   On: 05/25/2018 15:05   Ct  Angio Chest Pe W/cm &/or Wo Cm  Result Date: 06/20/2018 CLINICAL DATA:  Hypothermia. Bradycardia. Bilateral lower extremity pain. Alzheimer disease. Sepsis. EXAM: CT ANGIOGRAPHY CHEST CT ABDOMEN AND PELVIS WITH CONTRAST TECHNIQUE: Multidetector CT imaging of the chest was performed using the standard protocol during bolus administration of intravenous contrast. Multiplanar CT image reconstructions and MIPs were obtained to evaluate the vascular anatomy. Multidetector CT imaging of the abdomen and pelvis was performed using the standard protocol during bolus administration of intravenous contrast. CONTRAST:  ISOVUE-370 IOPAMIDOL (ISOVUE-370) INJECTION 76% COMPARISON:  Chest radiograph from earlier today. FINDINGS: CTA CHEST FINDINGS Significantly motion degraded scan, limiting assessment. Cardiovascular: The study is low quality for the evaluation of pulmonary embolism, limited by poor contrast opacification and by significant motion degradation. No central pulmonary emboli. The lobar, segmental and subsegmental pulmonary artery branches are poorly evaluated on this scan, with no convincing filling defects. Atherosclerotic nonaneurysmal thoracic aorta. Normal caliber pulmonary arteries. Mild cardiomegaly. No significant pericardial fluid/thickening. Left anterior descending coronary atherosclerosis. Mediastinum/Nodes: No discrete thyroid nodules. Unremarkable esophagus. No pathologically enlarged axillary, mediastinal or hilar lymph nodes. Lungs/Pleura: No pneumothorax. No pleural effusion. Mild hypoventilatory changes in the dependent lower lobes. No acute consolidative airspace disease, lung masses or significant pulmonary nodules. Musculoskeletal: No aggressive appearing focal osseous lesions. Marked thoracic spondylosis. Review of the MIP images confirms the above findings. CT ABDOMEN and PELVIS FINDINGS Hepatobiliary: Normal liver with no liver mass. Cholelithiasis. No gallbladder distention. No  gallbladder wall thickening. No pericholecystic fluid. No biliary ductal dilatation. Pancreas: Normal, with no mass or duct dilation. Spleen: Normal size. No mass. Adrenals/Urinary Tract: Left adrenal 2.5 cm mass with density 65 HU (series 7/image 22). No right adrenal nodules. Hydronephrosis. Simple 1.3 cm lower right renal cyst. No additional renal lesions. Bladder collapsed by indwelling Foley catheter and not well evaluated with no gross bladder abnormality. Stomach/Bowel: Small hiatal hernia. Otherwise normal nondistended stomach. Normal caliber small bowel with no small bowel wall thickening. Normal appendix. Moderate left colonic diverticulosis, with no large bowel wall thickening or acute pericolonic fat stranding. Vascular/Lymphatic: Atherosclerotic nonaneurysmal abdominal aorta. Patent portal, splenic, hepatic and renal veins. No pathologically enlarged lymph nodes in the abdomen or pelvis. Reproductive: Status post hysterectomy, with no abnormal findings at the vaginal cuff. No adnexal mass. Other: No pneumoperitoneum, ascites or focal fluid collection. Musculoskeletal: No aggressive appearing focal osseous lesions. Mild lumbar spondylosis. Review of the MIP images confirms the above findings. IMPRESSION: 1. Technically very limited motion degraded scan. No central pulmonary emboli.  2. Mild cardiomegaly. 3. Mild hypoventilatory changes in the dependent lungs. Otherwise no active pulmonary disease. 4. No acute abnormality in the abdomen or pelvis. No evidence of bowel obstruction or acute bowel inflammation. Moderate left colonic diverticulosis, with no evidence of acute diverticulitis. 5. Cholelithiasis, with no CT findings of acute cholecystitis. 6. Indeterminate left adrenal mass, probably a benign adrenal adenoma. Follow-up adrenal mass protocol CT abdomen without and with IV contrast suggested in 12 months. This recommendation follows ACR consensus guidelines: Management of Incidental Adrenal Masses:  A White Paper of the ACR Incidental Findings Committee. J Am Coll Radiol 2017;14:1038-1044. 7. Small hiatal hernia. Aortic Atherosclerosis (ICD10-I70.0). Electronically Signed   By: Delbert Phenix M.D.   On: 06/20/2018 20:53   Ct Cervical Spine Wo Contrast  Result Date: 05/25/2018 CLINICAL DATA:  Unwitnessed fall. EXAM: CT HEAD WITHOUT CONTRAST CT CERVICAL SPINE WITHOUT CONTRAST TECHNIQUE: Multidetector CT imaging of the head and cervical spine was performed following the standard protocol without intravenous contrast. Multiplanar CT image reconstructions of the cervical spine were also generated. COMPARISON:  None. FINDINGS: CT HEAD FINDINGS Brain: No subdural, epidural, or subarachnoid hemorrhage. Cerebellum, brainstem, and basal cisterns are normal. Ventricles and sulci are unremarkable. Tiny lacunar infarct in the anterior limb of the right internal capsule. Scattered white matter changes noted. No acute cortical ischemia or infarct. Ventricles and sulci are unremarkable. No mass effect or midline shift. Vascular: No hyperdense vessel or unexpected calcification. Skull: Normal. Negative for fracture or focal lesion. Sinuses/Orbits: No acute finding. Other: There is a laceration over the left eye. The globe is intact extracranial soft tissues otherwise normal. CT CERVICAL SPINE FINDINGS Alignment: No traumatic malalignment. Mild reversal of normal lordosis centered at C5-6. Skull base and vertebrae: No acute fracture. No primary bone lesion or focal pathologic process. Soft tissues and spinal canal: Narrowing of the spinal canal at C5-6 due to a posterior osteophyte. The anterior diameter of the canal in this region is 6 mm. No other abnormalities identified. Disc levels:  Multilevel degenerative changes. Upper chest: Negative. Other: No other abnormalities. IMPRESSION: 1. Scattered white matter changes. Tiny age-indeterminate lacunar infarct in the anterior limb of the right internal capsule. No other acute  intracranial abnormalities are noted. 2. Degenerative changes, most marked at C5-6. Narrowing of the spinal canal to 6 mm in AP diameter at C5-6 due to a posterior osteophyte. 3. No fracture or traumatic malalignment in the cervical spine. Electronically Signed   By: Gerome Sam III M.D   On: 05/25/2018 15:05   Ct Abdomen Pelvis W Contrast  Result Date: 06/20/2018 CLINICAL DATA:  Hypothermia. Bradycardia. Bilateral lower extremity pain. Alzheimer disease. Sepsis. EXAM: CT ANGIOGRAPHY CHEST CT ABDOMEN AND PELVIS WITH CONTRAST TECHNIQUE: Multidetector CT imaging of the chest was performed using the standard protocol during bolus administration of intravenous contrast. Multiplanar CT image reconstructions and MIPs were obtained to evaluate the vascular anatomy. Multidetector CT imaging of the abdomen and pelvis was performed using the standard protocol during bolus administration of intravenous contrast. CONTRAST:  ISOVUE-370 IOPAMIDOL (ISOVUE-370) INJECTION 76% COMPARISON:  Chest radiograph from earlier today. FINDINGS: CTA CHEST FINDINGS Significantly motion degraded scan, limiting assessment. Cardiovascular: The study is low quality for the evaluation of pulmonary embolism, limited by poor contrast opacification and by significant motion degradation. No central pulmonary emboli. The lobar, segmental and subsegmental pulmonary artery branches are poorly evaluated on this scan, with no convincing filling defects. Atherosclerotic nonaneurysmal thoracic aorta. Normal caliber pulmonary arteries. Mild cardiomegaly. No significant pericardial  fluid/thickening. Left anterior descending coronary atherosclerosis. Mediastinum/Nodes: No discrete thyroid nodules. Unremarkable esophagus. No pathologically enlarged axillary, mediastinal or hilar lymph nodes. Lungs/Pleura: No pneumothorax. No pleural effusion. Mild hypoventilatory changes in the dependent lower lobes. No acute consolidative airspace disease, lung  masses or significant pulmonary nodules. Musculoskeletal: No aggressive appearing focal osseous lesions. Marked thoracic spondylosis. Review of the MIP images confirms the above findings. CT ABDOMEN and PELVIS FINDINGS Hepatobiliary: Normal liver with no liver mass. Cholelithiasis. No gallbladder distention. No gallbladder wall thickening. No pericholecystic fluid. No biliary ductal dilatation. Pancreas: Normal, with no mass or duct dilation. Spleen: Normal size. No mass. Adrenals/Urinary Tract: Left adrenal 2.5 cm mass with density 65 HU (series 7/image 22). No right adrenal nodules. Hydronephrosis. Simple 1.3 cm lower right renal cyst. No additional renal lesions. Bladder collapsed by indwelling Foley catheter and not well evaluated with no gross bladder abnormality. Stomach/Bowel: Small hiatal hernia. Otherwise normal nondistended stomach. Normal caliber small bowel with no small bowel wall thickening. Normal appendix. Moderate left colonic diverticulosis, with no large bowel wall thickening or acute pericolonic fat stranding. Vascular/Lymphatic: Atherosclerotic nonaneurysmal abdominal aorta. Patent portal, splenic, hepatic and renal veins. No pathologically enlarged lymph nodes in the abdomen or pelvis. Reproductive: Status post hysterectomy, with no abnormal findings at the vaginal cuff. No adnexal mass. Other: No pneumoperitoneum, ascites or focal fluid collection. Musculoskeletal: No aggressive appearing focal osseous lesions. Mild lumbar spondylosis. Review of the MIP images confirms the above findings. IMPRESSION: 1. Technically very limited motion degraded scan. No central pulmonary emboli. 2. Mild cardiomegaly. 3. Mild hypoventilatory changes in the dependent lungs. Otherwise no active pulmonary disease. 4. No acute abnormality in the abdomen or pelvis. No evidence of bowel obstruction or acute bowel inflammation. Moderate left colonic diverticulosis, with no evidence of acute diverticulitis. 5.  Cholelithiasis, with no CT findings of acute cholecystitis. 6. Indeterminate left adrenal mass, probably a benign adrenal adenoma. Follow-up adrenal mass protocol CT abdomen without and with IV contrast suggested in 12 months. This recommendation follows ACR consensus guidelines: Management of Incidental Adrenal Masses: A White Paper of the ACR Incidental Findings Committee. J Am Coll Radiol 2017;14:1038-1044. 7. Small hiatal hernia. Aortic Atherosclerosis (ICD10-I70.0). Electronically Signed   By: Delbert Phenix M.D.   On: 06/20/2018 20:53   Dg Chest Port 1 View  Result Date: 06/21/2018 CLINICAL DATA:  Follow-up vascular congestion. EXAM: PORTABLE CHEST 1 VIEW COMPARISON:  CT 06/20/2018.  Chest x-ray 06/20/2018. FINDINGS: Stable cardiomegaly. Improved pulmonary venous congestion. No focal infiltrate. No pleural effusion or pneumothorax. No acute bony abnormality. IMPRESSION: Stable cardiomegaly. Improved pulmonary venous congestion. No acute pulmonary disease. Electronically Signed   By: Maisie Fus  Register   On: 06/21/2018 05:24   Dg Chest Portable 1 View  Result Date: 06/20/2018 CLINICAL DATA:  Altered mental status EXAM: PORTABLE CHEST 1 VIEW COMPARISON:  05/25/2018, 03/26/2010 FINDINGS: Low lung volumes. Enlarged cardiomediastinal silhouette with mild central vascular congestion. No acute consolidation. No pneumothorax. IMPRESSION: Low lung volumes.  Cardiomegaly with central vascular congestion. Electronically Signed   By: Jasmine Pang M.D.   On: 06/20/2018 16:01      Zannie Cove M.D on 06/24/2018 at 2:28 PM  Page via [email or web address]  After 7pm go to [email or web address] - password [email or web address]  [facility] -  Office  [phone]

## 2018-06-24 NOTE — Plan of Care (Signed)
  Problem: Activity: Goal: Capacity to carry out activities will improve 06/24/2018 2033 by Carolanne Grumbling, RN Outcome: Progressing 06/24/2018 0800 by Carolanne Grumbling, RN Outcome: Progressing   Problem: Activity: Goal: Risk for activity intolerance will decrease 06/24/2018 2033 by Carolanne Grumbling, RN Outcome: Progressing 06/24/2018 0800 by Carolanne Grumbling, RN Outcome: Progressing   Problem: Elimination: Goal: Will not experience complications related to bowel motility Outcome: Progressing   Problem: Safety: Goal: Ability to remain free from injury will improve Outcome: Progressing

## 2018-06-24 NOTE — [place] FL2 (Addendum)
[place] MEDICAID FL2 LEVEL OF CARE SCREENING TOOL     IDENTIFICATION  Patient Name: Penny Carr Birthdate: [birth year] Sex: female Admission Date (Current Location): 06/20/2018  [facility] and [place] Number:  Producer, television/film/video and Address:  The . [facility], [address], [place], [place] [address]      Provider Number: [id number]  Attending Physician Name and Address:  Zannie Cove, MD  Relative Name and Phone Number:       Current Level of Care: Hospital Recommended Level of Care: Assisted Living Facility, Memory Care(with PT.) Prior Approval Number:    Date Approved/Denied:   PASRR Number:    Discharge Plan: Other (Comment)(ALF Memory Care with PT.)    Current Diagnoses: Patient Active Problem List   Diagnosis Date Noted  . Shock (HCC) 06/20/2018  . DIVERTICULITIS, HX OF 03/26/2010    Orientation RESPIRATION BLADDER Height & Weight     Self  Normal Continent Weight: 253 lb 12.8 oz (115.1 kg) Height:  5\' 3"  (160 cm)  BEHAVIORAL SYMPTOMS/MOOD NEUROLOGICAL BOWEL NUTRITION STATUS  Wanderer(Cooperative, calm.) (None) Continent Low-sodium heart healthy  AMBULATORY STATUS COMMUNICATION OF NEEDS Skin   Limited Assist Verbally Skin abrasions, Bruising, Other (Comment)(Blister, Cellulitis, MASD, Weeping.)                       Personal Care Assistance Level of Assistance              Functional Limitations Info  Sight, Hearing, Speech Sight Info: Adequate Hearing Info: Adequate Speech Info: Adequate    SPECIAL CARE FACTORS FREQUENCY  PT (By licensed PT)     PT Frequency: 3 x week  Home Health              Contractures Contractures Info: Not present    Additional Factors Info  Code Status, Allergies Code Status Info: Full code Allergies Info: NKDA           Current Medications (06/24/2018):  This is the current hospital active medication list Current Facility-Administered Medications  Medication Dose  Route Frequency Provider Last Rate Last Dose  . acetaminophen (TYLENOL) tablet 650 mg  650 mg Oral Q6H PRN Elgergawy, Leana Roe, MD   650 mg at 06/24/18 0958  . divalproex (DEPAKOTE SPRINKLE) capsule 250 mg  250 mg Oral Q12H Max Fickle B, MD   250 mg at 06/24/18 0957  . doxycycline (VIBRA-TABS) tablet 100 mg  100 mg Oral Q12H Max Fickle B, MD   100 mg at 06/24/18 0957  . escitalopram (LEXAPRO) tablet 10 mg  10 mg Oral Daily Max Fickle B, MD   10 mg at 06/24/18 0957  . heparin injection 5,000 Units  5,000 Units Subcutaneous Q8H Lupita Leash, MD   5,000 Units at 06/24/18 0527  . insulin aspart (novoLOG) injection 0-15 Units  0-15 Units Subcutaneous TID WC Lupita Leash, MD   2 Units at 06/22/18 1733  . insulin aspart (novoLOG) injection 0-5 Units  0-5 Units Subcutaneous QHS Max Fickle B, MD      . MEDLINE mouth rinse  15 mL Mouth Rinse BID Max Fickle B, MD   15 mL at 06/24/18 1004  . memantine (NAMENDA XR) 24 hr capsule 28 mg  28 mg Oral Daily Max Fickle B, MD   28 mg at 06/24/18 0957  . OLANZapine (ZYPREXA) tablet 2.5 mg  2.5 mg Oral QHS Max Fickle B, MD   2.5 mg at 06/23/18 2121  Discharge Medications: STOP taking these medications   donepezil 10 MG tablet Commonly known as:  ARICEPT   isosorbide mononitrate 30 MG 24 hr tablet Commonly known as:  IMDUR   lisinopril 40 MG tablet Commonly known as:  PRINIVIL,ZESTRIL     TAKE these medications   acetaminophen 500 MG tablet Commonly known as:  TYLENOL Take 500 mg by mouth every 4 (four) hours as needed for mild pain. What changed:  Another medication with the same name was removed. Continue taking this medication, and follow the directions you see here.   acetaminophen 650 MG CR tablet Commonly known as:  TYLENOL Take 650 mg by mouth 2 (two) times daily. What changed:  Another medication with the same name was removed. Continue taking this medication, and follow the directions  you see here.   CALMOSEPTINE 0.44-20.6 % Oint Generic drug:  Menthol-Zinc Oxide Apply 1 application topically 2 (two) times daily as needed (rash/irritation).   Cranberry 500 MG Caps Take 500 mg by mouth 2 (two) times daily.   divalproex 125 MG capsule Commonly known as:  DEPAKOTE SPRINKLE Take 250 mg by mouth 2 (two) times daily.   doxycycline 100 MG tablet Commonly known as:  VIBRA-TABS Take 1 tablet (100 mg total) by mouth every 12 (twelve) hours for 2 days.   ergocalciferol 1.25 MG (50000 UT) capsule Commonly known as:  VITAMIN D2 Take 50,000 Units by mouth once a week. On Saturday   escitalopram 10 MG tablet Commonly known as:  LEXAPRO Take 10 mg by mouth daily.   furosemide 40 MG tablet Commonly known as:  LASIX Take 40 mg by mouth every other day.   guaifenesin 100 MG/5ML syrup Commonly known as:  ROBITUSSIN Take 200 mg by mouth 4 (four) times daily as needed for cough.   haloperidol 5 MG tablet Commonly known as:  HALDOL Take 2.5 mg by mouth 2 (two) times daily as needed for agitation.   loperamide 2 MG capsule Commonly known as:  IMODIUM Take 2 mg by mouth as needed for diarrhea or loose stools.   LORazepam 0.5 MG tablet Commonly known as:  ATIVAN Take 1 tablet (0.5 mg total) by mouth at bedtime. What changed:  when to take this   magnesium hydroxide 400 MG/5ML suspension Commonly known as:  MILK OF MAGNESIA Take 30 mLs by mouth at bedtime as needed for mild constipation.   memantine 28 MG Cp24 24 hr capsule Commonly known as:  NAMENDA XR Take 28 mg by mouth daily.   MINTOX 200-200-20 MG/5ML suspension Generic drug:  alum & mag hydroxide-simeth Take 30 mLs by mouth every 6 (six) hours as needed for indigestion or heartburn.   neomycin-bacitracin-polymyxin ointment Commonly known as:  NEOSPORIN Apply 1 application topically as needed for wound care.   nystatin powder Generic drug:  nystatin Apply 1 g topically 2 (two) times daily  as needed (candidiasis rash).   OLANZapine 2.5 MG tablet Commonly known as:  ZYPREXA Take 2.5 mg by mouth daily.   potassium chloride SA 20 MEQ tablet Commonly known as:  K-DUR,KLOR-CON Take 20 mEq by mouth daily.   simvastatin 40 MG tablet Commonly known as:  ZOCOR Take 40 mg by mouth daily.     Relevant Imaging Results:  Relevant Lab Results:   Additional Information SS#: [id number]  Margarito Liner, LCSW   Updated by Ervin Knack. Briana Newman, MSW, Theresia Majors [phone]  06/25/2018 11:29 AM

## 2018-06-24 NOTE — Progress Notes (Signed)
Physical Therapy Treatment Patient Details Name: Penny Carr MRN: [id number] DOB: [birth year] Today's Date: 06/24/2018    History of Present Illness Pt is a 73 y.o. female admitted from [facility] on 06/20/18 with BLE pain; found to have bradycardia, hypotension and hypothermia, unclear etiology. Required brief pressor support. Pt with mild LE cellulitis versus chronic venous stasis changes; cultures negative. PMH includes dementia, DM.   PT Comments    Pt slowly progressing with mobility. Able to transfer and ambulate with RW and close min guard for safety; pt limited by impaired cognitive status, only oriented to self, very confused and decreased attention. Pt able to move and perform ADLs fairly well, but requires frequent, repeated cues for safety. Recommend return to ALF if they are able to continue providing necessary level of support.    Follow Up Recommendations  Supervision/Assistance - 24 hour(return to ALF/SNF)     Equipment Recommendations  None recommended by PT    Recommendations for Other Services       Precautions / Restrictions Precautions Precautions: Fall Precaution Comments: tele sitter Restrictions Weight Bearing Restrictions: No    Mobility  Bed Mobility               General bed mobility comments: Received sitting in recliner  Transfers Overall transfer level: Needs assistance Equipment used: Rolling walker (2 wheeled) Transfers: Sit to/from Stand Sit to Stand: Min guard         General transfer comment: Min guard for safety standing from recliner and toilet to RW; no physical assist required  Ambulation/Gait Ambulation/Gait assistance: Min guard Gait Distance (Feet): 40 Feet Assistive device: Rolling walker (2 wheeled) Gait Pattern/deviations: Step-to pattern;Antalgic;Wide base of support;Trunk flexed;Drifts right/left Gait velocity: Decreased Gait velocity interpretation: <1.31 ft/sec, indicative of household  ambulator General Gait Details: Pt with slow, mostly steady amb to door with RW and min guard, becoming tearful and starting to wipe pubic area with napkin; amb to bathroom to void, then back to chair c/o back pain. Very poor safety awareness with all of this, requiring step-by-step cues for safety with RW and to stay on task; multiple times pt starting to turn away from RW and becoming unstable and fearful, requiring cues to stay with RW   Stairs             Wheelchair Mobility    Modified Rankin (Stroke Patients Only)       Balance Overall balance assessment: Needs assistance Sitting-balance support: Feet supported;No upper extremity supported Sitting balance-Leahy Scale: Fair Sitting balance - Comments: Able to perform pericare while seated on toilet   Standing balance support: During functional activity;Bilateral upper extremity supported Standing balance-Leahy Scale: Poor Standing balance comment: Reliant on at least single UE support                            Cognition Arousal/Alertness: Awake/alert Behavior During Therapy: WFL for tasks assessed/performed Overall Cognitive Status: History of cognitive impairments - at baseline Area of Impairment: Orientation;Attention;Memory;Following commands;Safety/judgement;Awareness;Problem solving                 Orientation Level: Disoriented to;Place;Time;Situation Current Attention Level: Focused Memory: Decreased short-term memory Following Commands: Follows one step commands inconsistently Safety/Judgement: Decreased awareness of safety;Decreased awareness of deficits Awareness: Intellectual Problem Solving: Difficulty sequencing;Requires verbal cues General Comments: H/o dementia. Only oriented to self. Words are nonsensical at times. Follows 1-2 step commands ~75% of time; poor attention, but able to redirect  to task with max cues      Exercises      General Comments        Pertinent  Vitals/Pain Pain Assessment: Faces Faces Pain Scale: Hurts little more Pain Location: Difficult to determine, pt inconsistently pointing to back and pubic area, then mentioning no pain Pain Descriptors / Indicators: Grimacing Pain Intervention(s): Limited activity within patient's tolerance;Monitored during session    Home Living                      Prior Function            PT Goals (current goals can now be found in the care plan section) Acute Rehab PT Goals Patient Stated Goal: None stated PT Goal Formulation: Patient unable to participate in goal setting Time For Goal Achievement: 07/06/18 Potential to Achieve Goals: Fair Progress towards PT goals: Progressing toward goals    Frequency    Min 2X/week      PT Plan Current plan remains appropriate    Co-evaluation              AM-PAC PT "6 Clicks" Mobility   Outcome Measure  Help needed turning from your back to your side while in a flat bed without using bedrails?: A Little Help needed moving from lying on your back to sitting on the side of a flat bed without using bedrails?: A Little Help needed moving to and from a bed to a chair (including a wheelchair)?: A Little Help needed standing up from a chair using your arms (e.g., wheelchair or bedside chair)?: A Little Help needed to walk in hospital room?: A Little Help needed climbing 3-5 steps with a railing? : A Lot 6 Click Score: 17    End of Session Equipment Utilized During Treatment: Gait belt Activity Tolerance: Patient tolerated treatment well;Patient limited by pain Patient left: in chair;with call bell/phone within reach;with chair alarm set;with nursing/sitter in room(telesitter) Nurse Communication: Mobility status PT Visit Diagnosis: Difficulty in walking, not elsewhere classified (R26.2);Muscle weakness (generalized) (M62.81)     Time: 6734-1937 PT Time Calculation (min) (ACUTE ONLY): 19 min  Charges:  $Therapeutic Activity: 8-22  mins                    Penny Carr, PT, DPT Acute Rehabilitation Services  Pager [phone] Office [phone]  Penny Carr 06/24/2018, 4:34 PM

## 2018-06-24 NOTE — Clinical Social Work Note (Addendum)
Obtained son's cell phone number from ALF [phone]). Left voicemail.   Charlynn Court, CSW [phone]  1:34 pm Per MD, patient may be stable for discharge tomorrow if no more pauses on the monitor. When CSW spoke with ALF this morning, they stated they prefer not to do weekend discharges due to issues with paperwork and there is no one there to review it. Left message for staff member at ALF to determine what needs to be done today for potential weekend discharge. Still no call back from son.  Charlynn Court, CSW [phone]

## 2018-06-24 NOTE — Plan of Care (Signed)
  Problem: Activity: Goal: Capacity to carry out activities will improve Outcome: Progressing   Problem: Activity: Goal: Risk for activity intolerance will decrease Outcome: Progressing   Problem: Elimination: Goal: Will not experience complications related to bowel motility Outcome: Progressing   

## 2018-06-24 NOTE — Progress Notes (Signed)
Pt. With 3.1 second pause. Pt. Asymptomatic and resting in bed. On call for [facility] paged to make aware.

## 2018-06-25 LAB — GLUCOSE, CAPILLARY
GLUCOSE-CAPILLARY: 83 mg/dL (ref 70–99)
Glucose-Capillary: 92 mg/dL (ref 70–99)

## 2018-06-25 LAB — CREATININE, SERUM
Creatinine, Ser: 0.91 mg/dL (ref 0.44–1.00)
GFR calc Af Amer: >60 mL/min (ref >60)
GFR calc non Af Amer: >60 mL/min (ref >60)

## 2018-06-25 LAB — CULTURE, BLOOD (ROUTINE X 2): Culture: NO GROWTH

## 2018-06-25 MED ORDER — DOXYCYCLINE HYCLATE 100 MG PO TABS: 100.0000 mg | ORAL_TABLET | Freq: Two times a day (BID) | ORAL | 0 refills | Status: AC

## 2018-06-25 MED ORDER — LORAZEPAM 0.5 MG PO TABS: 0.5000 mg | ORAL_TABLET | Freq: Every day | ORAL | 0 refills | Status: DC

## 2018-06-25 MED ORDER — DOXYCYCLINE HYCLATE 100 MG PO TABS: 100.0000 mg | ORAL_TABLET | Freq: Two times a day (BID) | ORAL | 0 refills | Status: DC

## 2018-06-25 NOTE — Clinical Social Work Note (Addendum)
CSW attempted to call patient's son Fayrene Fearing [phone] and also tried [phone].  CSW also contacted [facility], and had to leave a message with the Diplomatic Services operational officer.  Secretary gave CSW fax number [phone] to send discharge summary and FL2.  CSW faxed requested information, awaiting for call back from ALF.  11:45am CSW received phone call from [facility], they said the manager has to review patient's medications before they can determine if they can take her back.  CSW spoke to patient's son who said he could transport her if patient can return today.  CSW awaiting for call back from Warden/ranger at ALF.  1:17pm  CSW received phone call back from Warden/ranger at ALF, and she said she will have to review FL2 and disharge summary to make sure patient can return today.  She stated she will call CSW back with decision.  2:45pm  CSW received phone call from [place] at [facility], [phone] she reviewed the [facility] and discharge summary, and said as long as patient has signed scripts for Ativan and antibiotic she can return today.  CSW contacted patient's son and he is in agreement to having patient return, he will pick her up and take her back to ALF.  Hard copies of prescription, FL2, discharge summary, PT note, and home health orders were put in patient's chart to go with patient and son.  CSW notified bedside nurse to call report to Tammy at [phone].  Ervin Knack. Ardythe Klute, MSW, LCSWA [phone]  06/25/2018 11:40 AM

## 2018-06-25 NOTE — Progress Notes (Signed)
Patient discharge report given to Tammy at [facility].  Patient ready to be discharged, awaiting son to transport. Elnita Maxwell, RN

## 2018-06-25 NOTE — Progress Notes (Signed)
Pt. Noted with arm band off. Arm band removed by pt. Multiple times. New arm band placed on pt.

## 2018-06-25 NOTE — Discharge Summary (Signed)
Physician Discharge Summary  Secundino GingerDorretta S Mcchesney ZOX:[id number] DOB: 09/13/1945 DOA: 06/20/2018  PCP: Samuel JesterButler, Cynthia, DO  Admit date: 06/20/2018 Discharge date: 06/25/2018  Time spent: 45 minutes  Recommendations for Outpatient Follow-up:  1. Cardiology Dr.Bridgette Cristal DeerChristopher in 1 month 2. PCP Dr.Butler in 1 week, consider goals of care discussion and addressing CODE STATUS at follow-up  Discharge Diagnoses:  Active Problems:   Shock (HCC) Hypotension Bradycardia Dementia Mild lower extremity cellulitis Bilateral chronic venous stasis changes Delirium Depression  Discharge Condition: Stable  Diet recommendation: Low-sodium heart healthy  Filed Weights   06/23/18 0457 06/24/18 0359 06/25/18 0300  Weight: 113.4 kg 115.1 kg 115.3 kg    History of present illness:  73 year old female with past medical history of dementia, lives at SNF, presents with facility for complaining of bilateral lower extremity pain, she was noted to be bradycardic, hypotensive, she was admitted to ICU, required pressor support briefly, is to be improving, transferred to [facility] on 06/22/2018  Hospital Course:   Hypotension,  bradycardia, hypothermia -Likely secondary to dehydration and combination of diuretics and other antihypertensives  -Clinically did not have septic shock, very minimal cellulitis in both lower extremities, negative blood cultures, UA and urine cultures, process on CT chest, abdomen and pelvis. -Recent TSH within normal limits -Admitted to ICU initially, was on broad-spectrum antibiotics and pressors, very quickly weaned off pressors and broad-spectrum antibiotics. -Blood pressure and temperature has improved, heart rate remains low especially at night due to high vagal tone but asymptomatic, I discontinued her Aricept 2 days ago  -seen by cardiology as well, outpatient follow-up recommended  Elevated troponins -Secondary to demand ischemia, was seen by cardiology, echocardiogram noted  normal EF, no wall motion abnormalities, no further work-up recommended at this time stress test recommended as outpatient and cardiology has signed off   Dementia/delirium/depression -Restarted on home regimen of Zyprexa Namenda Lexapro and Aricept yesterday  -Discontinued aricept as this can cause AV effects -PT/OT eval needed, SNF recommended at discharge  Hypertension: -Was hypotensive on admission required pressors and IV fluids, improved  -blood pressure is now in the 110-1 20 range without antihypertensives -restarted home regimen of Lasix every other day -Continued Imdur and lisinopril at discharge can be restarted at a lower dose depending on blood pressure trend at follow-up  Bradycardia/sinus pauses -Cardiology input greatly appreciated, not on any AV nodal agents, asymptomatic, , no indication for pacemaker at this time . -stopped aricept, heart rate is better in the daytime  Mild leg cellulitis versus chronic venous stasis changes  -Was on broad-spectrum antibiotics in the ICU, cultures are negative, currently on doxycycline  -Appears more like venous stasis changes at this time, discontinue doxycycline after 2 doses  History of DM2 -Diet controlled CBGs less than 100 -Scale insulin used inpatient does not need medications at discharge  Code Status : Full  Discharge Exam: Vitals:   06/24/18 2101 06/25/18 0435  BP: (!) 133/96 (!) 126/44  Pulse: (!) 53 (!) 45  Resp: 18 16  Temp: 97.9 F (36.6 C) 98.4 F (36.9 C)  SpO2: 95% 93%    General: Alert awake, oriented to self only, pleasantly confused Cardiovascular: S1-S2/regular rate rhythm Respiratory: Clear bilaterally  Discharge Instructions   Discharge Instructions    Diet - low sodium heart healthy   Complete by:  As directed    Diet Carb Modified   Complete by:  As directed    Increase activity slowly   Complete by:  As directed  Allergies as of 06/25/2018   No Known Allergies      Medication List    STOP taking these medications   donepezil 10 MG tablet Commonly known as:  ARICEPT   isosorbide mononitrate 30 MG 24 hr tablet Commonly known as:  IMDUR   lisinopril 40 MG tablet Commonly known as:  PRINIVIL,ZESTRIL     TAKE these medications   acetaminophen 500 MG tablet Commonly known as:  TYLENOL Take 500 mg by mouth every 4 (four) hours as needed for mild pain. What changed:  Another medication with the same name was removed. Continue taking this medication, and follow the directions you see here.   acetaminophen 650 MG CR tablet Commonly known as:  TYLENOL Take 650 mg by mouth 2 (two) times daily. What changed:  Another medication with the same name was removed. Continue taking this medication, and follow the directions you see here.   CALMOSEPTINE 0.44-20.6 % Oint Generic drug:  Menthol-Zinc Oxide Apply 1 application topically 2 (two) times daily as needed (rash/irritation).   Cranberry 500 MG Caps Take 500 mg by mouth 2 (two) times daily.   divalproex 125 MG capsule Commonly known as:  DEPAKOTE SPRINKLE Take 250 mg by mouth 2 (two) times daily.   doxycycline 100 MG tablet Commonly known as:  VIBRA-TABS Take 1 tablet (100 mg total) by mouth every 12 (twelve) hours for 2 days.   ergocalciferol 1.25 MG (50000 UT) capsule Commonly known as:  VITAMIN D2 Take 50,000 Units by mouth once a week. On Saturday   escitalopram 10 MG tablet Commonly known as:  LEXAPRO Take 10 mg by mouth daily.   furosemide 40 MG tablet Commonly known as:  LASIX Take 40 mg by mouth every other day.   guaifenesin 100 MG/5ML syrup Commonly known as:  ROBITUSSIN Take 200 mg by mouth 4 (four) times daily as needed for cough.   haloperidol 5 MG tablet Commonly known as:  HALDOL Take 2.5 mg by mouth 2 (two) times daily as needed for agitation.   loperamide 2 MG capsule Commonly known as:  IMODIUM Take 2 mg by mouth as needed for diarrhea or loose stools.    LORazepam 0.5 MG tablet Commonly known as:  ATIVAN Take 1 tablet (0.5 mg total) by mouth at bedtime. What changed:  when to take this   magnesium hydroxide 400 MG/5ML suspension Commonly known as:  MILK OF MAGNESIA Take 30 mLs by mouth at bedtime as needed for mild constipation.   memantine 28 MG Cp24 24 hr capsule Commonly known as:  NAMENDA XR Take 28 mg by mouth daily.   MINTOX 200-200-20 MG/5ML suspension Generic drug:  alum & mag hydroxide-simeth Take 30 mLs by mouth every 6 (six) hours as needed for indigestion or heartburn.   neomycin-bacitracin-polymyxin ointment Commonly known as:  NEOSPORIN Apply 1 application topically as needed for wound care.   nystatin powder Generic drug:  nystatin Apply 1 g topically 2 (two) times daily as needed (candidiasis rash).   OLANZapine 2.5 MG tablet Commonly known as:  ZYPREXA Take 2.5 mg by mouth daily.   potassium chloride SA 20 MEQ tablet Commonly known as:  K-DUR,KLOR-CON Take 20 mEq by mouth daily.   simvastatin 40 MG tablet Commonly known as:  ZOCOR Take 40 mg by mouth daily.      No Known Allergies Follow-up Information    Jodelle Redhristopher, Bridgette, MD Follow up.   Specialty:  Cardiology Why:  our office will call to give follow up appt,  she will see you in about 1 month.  Contact information: [address] [place] [address] [phone]        [facility] Follow up.   Specialty:  Home Health Services Why:  They will do your home health care at your home Contact information: [address] [place] [address] [phone]            The results of significant diagnostics from this hospitalization (including imaging, microbiology, ancillary and laboratory) are listed below for reference.    Significant Diagnostic Studies: Ct Head Wo Contrast  Result Date: 06/20/2018 CLINICAL DATA:  73 year old female with altered mental status. EXAM: CT HEAD WITHOUT CONTRAST  TECHNIQUE: Contiguous axial images were obtained from the base of the skull through the vertex without intravenous contrast. COMPARISON:  05/25/2018 CT FINDINGS: Brain: No evidence of acute infarction, hemorrhage, hydrocephalus, extra-axial collection or mass lesion/mass effect. Atrophy and chronic small-vessel white matter ischemic changes again noted. Vascular: No hyperdense vessel or unexpected calcification. Skull: Normal. Negative for fracture or focal lesion. Sinuses/Orbits: No acute finding. Other: None. IMPRESSION: 1. No evidence of acute intracranial abnormality. 2. Atrophy and chronic small-vessel white matter ischemic changes. Electronically Signed   By: Harmon Pier M.D.   On: 06/20/2018 20:41   Ct Angio Chest Pe W/cm &/or Wo Cm  Result Date: 06/20/2018 CLINICAL DATA:  Hypothermia. Bradycardia. Bilateral lower extremity pain. Alzheimer disease. Sepsis. EXAM: CT ANGIOGRAPHY CHEST CT ABDOMEN AND PELVIS WITH CONTRAST TECHNIQUE: Multidetector CT imaging of the chest was performed using the standard protocol during bolus administration of intravenous contrast. Multiplanar CT image reconstructions and MIPs were obtained to evaluate the vascular anatomy. Multidetector CT imaging of the abdomen and pelvis was performed using the standard protocol during bolus administration of intravenous contrast. CONTRAST:  ISOVUE-370 IOPAMIDOL (ISOVUE-370) INJECTION 76% COMPARISON:  Chest radiograph from earlier today. FINDINGS: CTA CHEST FINDINGS Significantly motion degraded scan, limiting assessment. Cardiovascular: The study is low quality for the evaluation of pulmonary embolism, limited by poor contrast opacification and by significant motion degradation. No central pulmonary emboli. The lobar, segmental and subsegmental pulmonary artery branches are poorly evaluated on this scan, with no convincing filling defects. Atherosclerotic nonaneurysmal thoracic aorta. Normal caliber pulmonary arteries. Mild  cardiomegaly. No significant pericardial fluid/thickening. Left anterior descending coronary atherosclerosis. Mediastinum/Nodes: No discrete thyroid nodules. Unremarkable esophagus. No pathologically enlarged axillary, mediastinal or hilar lymph nodes. Lungs/Pleura: No pneumothorax. No pleural effusion. Mild hypoventilatory changes in the dependent lower lobes. No acute consolidative airspace disease, lung masses or significant pulmonary nodules. Musculoskeletal: No aggressive appearing focal osseous lesions. Marked thoracic spondylosis. Review of the MIP images confirms the above findings. CT ABDOMEN and PELVIS FINDINGS Hepatobiliary: Normal liver with no liver mass. Cholelithiasis. No gallbladder distention. No gallbladder wall thickening. No pericholecystic fluid. No biliary ductal dilatation. Pancreas: Normal, with no mass or duct dilation. Spleen: Normal size. No mass. Adrenals/Urinary Tract: Left adrenal 2.5 cm mass with density 65 HU (series 7/image 22). No right adrenal nodules. Hydronephrosis. Simple 1.3 cm lower right renal cyst. No additional renal lesions. Bladder collapsed by indwelling Foley catheter and not well evaluated with no gross bladder abnormality. Stomach/Bowel: Small hiatal hernia. Otherwise normal nondistended stomach. Normal caliber small bowel with no small bowel wall thickening. Normal appendix. Moderate left colonic diverticulosis, with no large bowel wall thickening or acute pericolonic fat stranding. Vascular/Lymphatic: Atherosclerotic nonaneurysmal abdominal aorta. Patent portal, splenic, hepatic and renal veins. No pathologically enlarged lymph nodes in the abdomen or  pelvis. Reproductive: Status post hysterectomy, with no abnormal findings at the vaginal cuff. No adnexal mass. Other: No pneumoperitoneum, ascites or focal fluid collection. Musculoskeletal: No aggressive appearing focal osseous lesions. Mild lumbar spondylosis. Review of the MIP images confirms the above findings.  IMPRESSION: 1. Technically very limited motion degraded scan. No central pulmonary emboli. 2. Mild cardiomegaly. 3. Mild hypoventilatory changes in the dependent lungs. Otherwise no active pulmonary disease. 4. No acute abnormality in the abdomen or pelvis. No evidence of bowel obstruction or acute bowel inflammation. Moderate left colonic diverticulosis, with no evidence of acute diverticulitis. 5. Cholelithiasis, with no CT findings of acute cholecystitis. 6. Indeterminate left adrenal mass, probably a benign adrenal adenoma. Follow-up adrenal mass protocol CT abdomen without and with IV contrast suggested in 12 months. This recommendation follows ACR consensus guidelines: Management of Incidental Adrenal Masses: A White Paper of the ACR Incidental Findings Committee. J Am Coll Radiol 2017;14:1038-1044. 7. Small hiatal hernia. Aortic Atherosclerosis (ICD10-I70.0). Electronically Signed   By: Delbert Phenix M.D.   On: 06/20/2018 20:53   Ct Abdomen Pelvis W Contrast  Result Date: 06/20/2018 CLINICAL DATA:  Hypothermia. Bradycardia. Bilateral lower extremity pain. Alzheimer disease. Sepsis. EXAM: CT ANGIOGRAPHY CHEST CT ABDOMEN AND PELVIS WITH CONTRAST TECHNIQUE: Multidetector CT imaging of the chest was performed using the standard protocol during bolus administration of intravenous contrast. Multiplanar CT image reconstructions and MIPs were obtained to evaluate the vascular anatomy. Multidetector CT imaging of the abdomen and pelvis was performed using the standard protocol during bolus administration of intravenous contrast. CONTRAST:  ISOVUE-370 IOPAMIDOL (ISOVUE-370) INJECTION 76% COMPARISON:  Chest radiograph from earlier today. FINDINGS: CTA CHEST FINDINGS Significantly motion degraded scan, limiting assessment. Cardiovascular: The study is low quality for the evaluation of pulmonary embolism, limited by poor contrast opacification and by significant motion degradation. No central pulmonary emboli.  The lobar, segmental and subsegmental pulmonary artery branches are poorly evaluated on this scan, with no convincing filling defects. Atherosclerotic nonaneurysmal thoracic aorta. Normal caliber pulmonary arteries. Mild cardiomegaly. No significant pericardial fluid/thickening. Left anterior descending coronary atherosclerosis. Mediastinum/Nodes: No discrete thyroid nodules. Unremarkable esophagus. No pathologically enlarged axillary, mediastinal or hilar lymph nodes. Lungs/Pleura: No pneumothorax. No pleural effusion. Mild hypoventilatory changes in the dependent lower lobes. No acute consolidative airspace disease, lung masses or significant pulmonary nodules. Musculoskeletal: No aggressive appearing focal osseous lesions. Marked thoracic spondylosis. Review of the MIP images confirms the above findings. CT ABDOMEN and PELVIS FINDINGS Hepatobiliary: Normal liver with no liver mass. Cholelithiasis. No gallbladder distention. No gallbladder wall thickening. No pericholecystic fluid. No biliary ductal dilatation. Pancreas: Normal, with no mass or duct dilation. Spleen: Normal size. No mass. Adrenals/Urinary Tract: Left adrenal 2.5 cm mass with density 65 HU (series 7/image 22). No right adrenal nodules. Hydronephrosis. Simple 1.3 cm lower right renal cyst. No additional renal lesions. Bladder collapsed by indwelling Foley catheter and not well evaluated with no gross bladder abnormality. Stomach/Bowel: Small hiatal hernia. Otherwise normal nondistended stomach. Normal caliber small bowel with no small bowel wall thickening. Normal appendix. Moderate left colonic diverticulosis, with no large bowel wall thickening or acute pericolonic fat stranding. Vascular/Lymphatic: Atherosclerotic nonaneurysmal abdominal aorta. Patent portal, splenic, hepatic and renal veins. No pathologically enlarged lymph nodes in the abdomen or pelvis. Reproductive: Status post hysterectomy, with no abnormal findings at the vaginal cuff. No  adnexal mass. Other: No pneumoperitoneum, ascites or focal fluid collection. Musculoskeletal: No aggressive appearing focal osseous lesions. Mild lumbar spondylosis. Review of the MIP images confirms the above findings.  IMPRESSION: 1. Technically very limited motion degraded scan. No central pulmonary emboli. 2. Mild cardiomegaly. 3. Mild hypoventilatory changes in the dependent lungs. Otherwise no active pulmonary disease. 4. No acute abnormality in the abdomen or pelvis. No evidence of bowel obstruction or acute bowel inflammation. Moderate left colonic diverticulosis, with no evidence of acute diverticulitis. 5. Cholelithiasis, with no CT findings of acute cholecystitis. 6. Indeterminate left adrenal mass, probably a benign adrenal adenoma. Follow-up adrenal mass protocol CT abdomen without and with IV contrast suggested in 12 months. This recommendation follows ACR consensus guidelines: Management of Incidental Adrenal Masses: A White Paper of the ACR Incidental Findings Committee. J Am Coll Radiol 2017;14:1038-1044. 7. Small hiatal hernia. Aortic Atherosclerosis (ICD10-I70.0). Electronically Signed   By: Delbert Phenix M.D.   On: 06/20/2018 20:53   Dg Chest Port 1 View  Result Date: 06/21/2018 CLINICAL DATA:  Follow-up vascular congestion. EXAM: PORTABLE CHEST 1 VIEW COMPARISON:  CT 06/20/2018.  Chest x-ray 06/20/2018. FINDINGS: Stable cardiomegaly. Improved pulmonary venous congestion. No focal infiltrate. No pleural effusion or pneumothorax. No acute bony abnormality. IMPRESSION: Stable cardiomegaly. Improved pulmonary venous congestion. No acute pulmonary disease. Electronically Signed   By: Maisie Fus  Register   On: 06/21/2018 05:24   Dg Chest Portable 1 View  Result Date: 06/20/2018 CLINICAL DATA:  Altered mental status EXAM: PORTABLE CHEST 1 VIEW COMPARISON:  05/25/2018, 03/26/2010 FINDINGS: Low lung volumes. Enlarged cardiomediastinal silhouette with mild central vascular congestion. No acute  consolidation. No pneumothorax. IMPRESSION: Low lung volumes.  Cardiomegaly with central vascular congestion. Electronically Signed   By: Jasmine Pang M.D.   On: 06/20/2018 16:01    Microbiology: Recent Results (from the past 240 hour(s))  Blood Culture (routine x 2)     Status: Abnormal   Collection Time: 06/20/18  5:15 PM  Result Value Ref Range Status   Specimen Description   Final    BLOOD LEFT FOREARM Performed at [facility], [address] [place], [place] [address]    Special Requests   Final    BOTTLES DRAWN AEROBIC AND ANAEROBIC Blood Culture adequate volume Performed at [facility], [address] [place], [place] [address]    Culture  Setup Time   Final    GRAM POSITIVE RODS AEROBIC BOTTLE ONLY CRITICAL RESULT CALLED TO, READ BACK BY AND VERIFIED WITH: PHARMS T DANG [phone] MLM    Culture (A)  Final    DIPHTHEROIDS(CORYNEBACTERIUM SPECIES) Standardized susceptibility testing for this organism is not available. Performed at [facility], [address], [place], [place] [address]    Report Status 06/23/2018 FINAL  Final  Blood Culture (routine x 2)     Status: None   Collection Time: 06/20/18  5:19 PM  Result Value Ref Range Status   Specimen Description BLOOD RIGHT HAND  Final   Special Requests   Final    BOTTLES DRAWN AEROBIC ONLY Blood Culture results may not be optimal due to an inadequate volume of blood received in culture bottles Performed at [facility], [address], [place], [place] [address]    Culture NO GROWTH 5 DAYS  Final   Report Status 06/25/2018 FINAL  Final  Urine culture     Status: None   Collection Time: 06/20/18  7:44 PM  Result Value Ref Range Status   Specimen Description   Final    URINE, CATHETERIZED Performed at [facility], [address], [place], [place] [address]  Special Requests   Final    NONE Performed at [facility], [address] [place], [place] [address]    Culture   Final    NO GROWTH Performed at [facility], [address] [place], [place] [address]    Report Status 06/22/2018 FINAL  Final  MRSA PCR Screening     Status: None   Collection Time: 06/20/18  9:30 PM  Result Value Ref Range Status   MRSA by PCR NEGATIVE NEGATIVE Final    Comment:        The GeneXpert MRSA Assay (FDA approved for NASAL specimens only), is one component of a comprehensive MRSA colonization surveillance program. It is not intended to diagnose MRSA infection nor to guide or monitor treatment for MRSA infections. Performed at [facility], [address] [place], [place] [address]      Labs: Basic Metabolic Panel: Recent Labs  Lab 06/20/18 1336 06/22/18 0523 06/23/18 0419 06/25/18 0510  NA 144 144 143  --   K 4.4 4.0 4.1  --   CL 106 105 107  --   CO2 32 33* 28  --   GLUCOSE 107* 101* 103*  --   BUN 26* 12 14  --   CREATININE 1.01* 0.81 0.87 0.91  CALCIUM 9.9 9.9 9.6  --    Liver Function Tests: Recent Labs  Lab 06/20/18 1336  AST 18  ALT 16  ALKPHOS 56  BILITOT 0.4  PROT 6.2*  ALBUMIN 3.2*   No results for input(s): LIPASE, AMYLASE in the last 168 hours. No results for input(s): AMMONIA in the last 168 hours. CBC: Recent Labs  Lab 06/20/18 1336  WBC 5.9  NEUTROABS 2.7  HGB 11.5*  HCT 38.4  MCV 98.2  PLT 143*   Cardiac Enzymes: Recent Labs  Lab 06/20/18 1336 06/20/18 2323 06/21/18 0454 06/21/18 1113  TROPONINI 0.31* 0.20* 0.15* 0.17*   BNP: BNP (last 3 results) Recent Labs    06/20/18 1336  BNP 89.8    ProBNP (last 3 results) No results for input(s): PROBNP in the last 8760 hours.  CBG: Recent Labs  Lab 06/24/18 0756 06/24/18 1137 06/24/18 1632 06/24/18 2149 06/25/18 0732  GLUCAP 89 119* 133* 117* 83       Signed:  Zannie Cove MD.  [facility] 06/25/2018, 11:02 AM

## 2018-07-22 ENCOUNTER — Ambulatory Visit (INDEPENDENT_AMBULATORY_CARE_PROVIDER_SITE_OTHER): Payer: Medicare Other | Admitting: Cardiology

## 2018-07-22 ENCOUNTER — Encounter: Payer: Self-pay | Admitting: Cardiology

## 2018-07-22 VITALS — BP 112/68 | HR 62 | Ht 61.0 in | Wt 267.6 lb

## 2018-07-22 DIAGNOSIS — Z8679 Personal history of other diseases of the circulatory system: Secondary | ICD-10-CM

## 2018-07-22 DIAGNOSIS — Z09 Encounter for follow-up examination after completed treatment for conditions other than malignant neoplasm: Secondary | ICD-10-CM | POA: Diagnosis not present

## 2018-07-22 NOTE — Patient Instructions (Signed)
Medication Instructions:  Your Physician recommend you continue on your current medication as directed.    If you need a refill on your cardiac medications before your next appointment, please call your pharmacy.   Lab work: None  Testing/Procedures: None  Follow-Up: At [facility], you and your health needs are our priority.  As part of our continuing mission to provide you with exceptional heart care, we have created designated Provider Care Teams.  These Care Teams include your primary Cardiologist (physician) and Advanced Practice Providers (APPs -  Physician Assistants and Nurse Practitioners) who all work together to provide you with the care you need, when you need it. You will need a follow up appointment as needed.  Please call our office 2 months in advance to schedule this appointment.  You may see Dr. Christopher or one of the following Advanced Practice Providers on your designated Care Team:   Rhonda Barrett, PA-C . Kathryn Lawrence, DNP, ANP     

## 2018-07-22 NOTE — Progress Notes (Signed)
Cardiology Office Note:    Date:  07/22/2018   ID:  Penny GingerDorretta S Greenfield, DOB [birth year], MRN [id number]  PCP:  Samuel JesterButler, Cynthia, DO  Cardiologist:  Jodelle RedBridgette Chantele Corado, MD PhD  Referring MD: Samuel JesterButler, Cynthia, DO   CC: post hospital follow up  History of Present Illness:    Penny Carr is a 73 y.o. female with a hx of dementia, chronic LE edema who is seen today at the request of Samuel JesterButler, Cynthia, DO for the evaluation and management of post hospital follow up. I saw her initially in the hospital on 06/20/18 for evaluation of bradycardia and hypotension. At the time, she was dehydrated and had hypotension, demand ischemia, sinus bradycardia. Her echo showed normal EF without wall motion abnormalities. Her bradycardia was more pronounced at night, likely due to vagal tone. Her aricept was stopped.   [facility] Overall has been doing well, no issues other than anxiety. Patient cannot give a history due to her dementia, but denies any current concerns.   Past Medical History:  Diagnosis Date  . Alzheimer disease (HCC)   . Diabetes mellitus without complication (HCC)     History reviewed. No pertinent surgical history.  Current Medications: Current Outpatient Medications on File Prior to Visit  Medication Sig  . acetaminophen (TYLENOL) 500 MG tablet Take 500 mg by mouth every 4 (four) hours as needed for mild pain.  Marland Kitchen. acetaminophen (TYLENOL) 650 MG CR tablet Take 650 mg by mouth 2 (two) times daily.  Marland Kitchen. alum & mag hydroxide-simeth (MINTOX) 200-200-20 MG/5ML suspension Take 30 mLs by mouth every 6 (six) hours as needed for indigestion or heartburn.  . Cranberry 500 MG CAPS Take 500 mg by mouth 2 (two) times daily.  . divalproex (DEPAKOTE SPRINKLE) 125 MG capsule Take 250 mg by mouth 2 (two) times daily.  . ergocalciferol (VITAMIN D2) 1.25 MG (50000 UT) capsule Take 50,000 Units by mouth once a week. On Saturday  . escitalopram (LEXAPRO)  10 MG tablet Take 10 mg by mouth daily.  . furosemide (LASIX) 40 MG tablet Take 40 mg by mouth every other day.  . guaifenesin (ROBITUSSIN) 100 MG/5ML syrup Take 200 mg by mouth 4 (four) times daily as needed for cough.  . haloperidol (HALDOL) 5 MG tablet Take 2.5 mg by mouth 2 (two) times daily as needed for agitation.  Marland Kitchen. loperamide (IMODIUM) 2 MG capsule Take 2 mg by mouth as needed for diarrhea or loose stools.  Marland Kitchen. LORazepam (ATIVAN) 0.5 MG tablet Take 1 tablet (0.5 mg total) by mouth at bedtime.  . magnesium hydroxide (MILK OF MAGNESIA) 400 MG/5ML suspension Take 30 mLs by mouth at bedtime as needed for mild constipation.  . memantine (NAMENDA XR) 28 MG CP24 24 hr capsule Take 28 mg by mouth daily.  . Menthol-Zinc Oxide (CALMOSEPTINE) 0.44-20.6 % OINT Apply 1 application topically 2 (two) times daily as needed (rash/irritation).  . neomycin-bacitracin-polymyxin (NEOSPORIN) ointment Apply 1 application topically as needed for wound care.   . nystatin (NYSTATIN) powder Apply 1 g topically 2 (two) times daily as needed (candidiasis rash).  . OLANZapine (ZYPREXA) 2.5 MG tablet Take 2.5 mg by mouth daily.  . potassium chloride SA (K-DUR,KLOR-CON) 20 MEQ tablet Take 20 mEq by mouth daily.  . simvastatin (ZOCOR) 40 MG tablet Take 40 mg by mouth daily.   No current facility-administered medications on file prior to visit.      Allergies:   Patient has no known allergies.  Social History   Socioeconomic History  . Marital status: Widowed    Spouse name: Not on file  . Number of children: Not on file  . Years of education: Not on file  . Highest education level: Not on file  Occupational History  . Not on file  Social Needs  . Financial resource strain: Not on file  . Food insecurity:    Worry: Not on file    Inability: Not on file  . Transportation needs:    Medical: Not on file    Non-medical: Not on file  Tobacco Use  . Smoking status: Former Games developer  . Smokeless tobacco: Never  Used  Substance and Sexual Activity  . Alcohol use: No  . Drug use: No  . Sexual activity: Never  Lifestyle  . Physical activity:    Days per week: Not on file    Minutes per session: Not on file  . Stress: Not on file  Relationships  . Social connections:    Talks on phone: Not on file    Gets together: Not on file    Attends religious service: Not on file    Active member of club or organization: Not on file    Attends meetings of clubs or organizations: Not on file    Relationship status: Not on file  Other Topics Concern  . Not on file  Social History Narrative  . Not on file     Family History: The patient's family history is unknown, and she cannot given a history due to dementia.  ROS:   Please see the history of present illness.  Additional pertinent ROS is negative and limited by dementia.     EKGs/Labs/Other Studies Reviewed:    The following studies were reviewed today: Echo 12/31 - Left ventricle: The cavity size was normal. Wall thickness was normal. Systolic function was normal. The estimated ejection fraction was in the range of 60% to 65%. Wall motion was normal; there were no regional wall motion abnormalities. Doppler parameters are consistent with abnormal left ventricular relaxation (grade 1 diastolic dysfunction). - Mitral valve: Calcified annulus. - Left atrium: The atrium was moderately dilated. - Pulmonary arteries: PA peak pressure: 31 mm Hg (S).  EKG:  EKG is personally reviewed.  The ekg ordered today demonstrates normal sinus rhythm with a heart rate of 62 bpm.  Recent Labs: 06/20/2018: ALT 16; B Natriuretic Peptide 89.8; Hemoglobin 11.5; Platelets 143; TSH 4.132 06/23/2018: BUN 14; Potassium 4.1; Sodium 143 06/25/2018: Creatinine, Ser 0.91  Recent Lipid Panel    Component Value Date/Time   CHOL  10/19/2008 0635    148        ATP III CLASSIFICATION:  <200     mg/dL   Desirable  165-537  mg/dL   Borderline High  >=482     mg/dL   High          TRIG 707 10/19/2008 0635   HDL 36 (L) 10/19/2008 0635   CHOLHDL 4.1 10/19/2008 0635   VLDL 21 10/19/2008 0635   LDLCALC  10/19/2008 0635    91        Total Cholesterol/HDL:CHD Risk Coronary Heart Disease Risk Table                     Men   Women  1/2 Average Risk   3.4   3.3  Average Risk       5.0   4.4  2 X Average Risk  9.6   7.1  3 X Average Risk  23.4   11.0        Use the calculated Patient Ratio above and the CHD Risk Table to determine the patient's CHD Risk.        ATP III CLASSIFICATION (LDL):  <100     mg/dL   Optimal  321-224  mg/dL   Near or Above                    Optimal  130-159  mg/dL   Borderline  825-003  mg/dL   High  >704     mg/dL   Very High    Physical Exam:    VS:  BP 112/68   Pulse 62   Ht 5\' 1"  (1.549 m)   Wt 267 lb 9.6 oz (121.4 kg)   BMI 50.56 kg/m     Wt Readings from Last 3 Encounters:  07/22/18 267 lb 9.6 oz (121.4 kg)  06/25/18 254 lb 1.6 oz (115.3 kg)  05/25/18 220 lb (99.8 kg)     GEN: Well nourished, well developed  Sitting in wheelchair in no acute distress HEENT: Normal NECK: No JVD appreciated; No carotid bruits LYMPHATICS: No lymphadenopathy CARDIAC: regular rhythm, normal S1 and S2, no murmurs, rubs, gallops. Radial pulses 2+ bilaterally. RESPIRATORY:  Clear to auscultation without rales, wheezing or rhonchi  ABDOMEN: Soft, non-tender, non-distended. Obese abdomen. MUSCULOSKELETAL:  Bilateral brawny LE edema; No deformity  SKIN: Warm and dry NEUROLOGIC:  Alert and oriented x 3 PSYCHIATRIC:  Normal affect   ASSESSMENT:    1. History of sinus bradycardia   2. Hospital discharge follow-up   3. History of hypotension    PLAN:    Recent hospitalization, with evidence of sinus bradycardia and hypotension: recovered today. No symptoms since discharge, no issues per caregiver (as patient has dementia). Given that she is asymptomatic and had normal EF in the hospital, will not pursue additional  testing at this time.  Plan for follow up: as needed  Medication Adjustments/Labs and Tests Ordered: Current medicines are reviewed at length with the patient today.  Concerns regarding medicines are outlined above.  Orders Placed This Encounter  Procedures  . EKG 12-Lead   No orders of the defined types were placed in this encounter.   Patient Instructions  Medication Instructions:  Your Physician recommend you continue on your current medication as directed.    If you need a refill on your cardiac medications before your next appointment, please call your pharmacy.   Lab work: None  Testing/Procedures: None  Follow-Up: At [facility], you and your health needs are our priority.  As part of our continuing mission to provide you with exceptional heart care, we have created designated Provider Care Teams.  These Care Teams include your primary Cardiologist (physician) and Advanced Practice Providers (APPs -  Physician Assistants and Nurse Practitioners) who all work together to provide you with the care you need, when you need it. You will need a follow up appointment as needed Please call our office 2 months in advance to schedule this appointment.  You may see Dr. Cristal Deer or one of the following Advanced Practice Providers on your designated Care Team:   Theodore Demark, PA-C . Joni Reining, DNP, ANP       Signed, [name], MD PhD 07/22/2018 11:15 AM    [facility]

## 2019-01-02 ENCOUNTER — Other Ambulatory Visit: Payer: Self-pay

## 2019-01-02 ENCOUNTER — Inpatient Hospital Stay (HOSPITAL_COMMUNITY)
Admission: EM | Admit: 2019-01-02 | Discharge: 2019-01-13 | DRG: 690 | Disposition: A | Discharge status: Skilled Nursing Facility | Payer: Medicare Other | Source: Skilled Nursing Facility | Attending: Internal Medicine | Admitting: Internal Medicine

## 2019-01-02 ENCOUNTER — Encounter (HOSPITAL_COMMUNITY): Payer: Self-pay

## 2019-01-02 ENCOUNTER — Emergency Department (HOSPITAL_COMMUNITY): Payer: Medicare Other

## 2019-01-02 DIAGNOSIS — W050XXA Fall from non-moving wheelchair, initial encounter: Secondary | ICD-10-CM | POA: Diagnosis present

## 2019-01-02 DIAGNOSIS — Z6838 Body mass index (BMI) 38.0-38.9, adult: Secondary | ICD-10-CM

## 2019-01-02 DIAGNOSIS — I878 Other specified disorders of veins: Secondary | ICD-10-CM | POA: Diagnosis present

## 2019-01-02 DIAGNOSIS — R600 Localized edema: Secondary | ICD-10-CM | POA: Diagnosis present

## 2019-01-02 DIAGNOSIS — S01511A Laceration without foreign body of lip, initial encounter: Secondary | ICD-10-CM | POA: Diagnosis present

## 2019-01-02 DIAGNOSIS — R001 Bradycardia, unspecified: Secondary | ICD-10-CM | POA: Diagnosis present

## 2019-01-02 DIAGNOSIS — E669 Obesity, unspecified: Secondary | ICD-10-CM | POA: Diagnosis present

## 2019-01-02 DIAGNOSIS — F0390 Unspecified dementia without behavioral disturbance: Secondary | ICD-10-CM | POA: Diagnosis present

## 2019-01-02 DIAGNOSIS — G309 Alzheimer's disease, unspecified: Secondary | ICD-10-CM | POA: Diagnosis present

## 2019-01-02 DIAGNOSIS — R627 Adult failure to thrive: Secondary | ICD-10-CM | POA: Diagnosis present

## 2019-01-02 DIAGNOSIS — Z1159 Encounter for screening for other viral diseases: Secondary | ICD-10-CM

## 2019-01-02 DIAGNOSIS — R680 Hypothermia, not associated with low environmental temperature: Secondary | ICD-10-CM | POA: Diagnosis present

## 2019-01-02 DIAGNOSIS — N390 Urinary tract infection, site not specified: Principal | ICD-10-CM | POA: Diagnosis present

## 2019-01-02 DIAGNOSIS — F0280 Dementia in other diseases classified elsewhere without behavioral disturbance: Secondary | ICD-10-CM | POA: Diagnosis present

## 2019-01-02 DIAGNOSIS — Z87891 Personal history of nicotine dependence: Secondary | ICD-10-CM

## 2019-01-02 DIAGNOSIS — E785 Hyperlipidemia, unspecified: Secondary | ICD-10-CM | POA: Diagnosis present

## 2019-01-02 DIAGNOSIS — R52 Pain, unspecified: Secondary | ICD-10-CM | POA: Diagnosis not present

## 2019-01-02 DIAGNOSIS — E119 Type 2 diabetes mellitus without complications: Secondary | ICD-10-CM

## 2019-01-02 DIAGNOSIS — T68XXXA Hypothermia, initial encounter: Secondary | ICD-10-CM

## 2019-01-02 DIAGNOSIS — W19XXXA Unspecified fall, initial encounter: Secondary | ICD-10-CM

## 2019-01-02 LAB — CBG MONITORING, ED: Glucose-Capillary: 112 mg/dL — ABNORMAL HIGH (ref 70–99)

## 2019-01-02 MED ORDER — NALOXONE HCL 2 MG/2ML IJ SOSY
1.0000 mg | PREFILLED_SYRINGE | Freq: Once | INTRAMUSCULAR | Status: AC
  Administered 2019-01-02: 1 mg via INTRAMUSCULAR
  Filled 2019-01-02: qty 2

## 2019-01-02 NOTE — ED Notes (Addendum)
Md made aware of rectal temp. Bairhugger applied

## 2019-01-02 NOTE — ED Triage Notes (Signed)
Per ems: Pt coming from [facility] after witnessed falling out of her wheelchair. Pt has a laceration on the upper lip. Hx of dementia. Staff reports pt is at baseline with not talking and just moaning. No LOC, no blood thinners. C-collar in place.

## 2019-01-03 ENCOUNTER — Emergency Department (HOSPITAL_COMMUNITY): Payer: Medicare Other

## 2019-01-03 ENCOUNTER — Encounter (HOSPITAL_COMMUNITY): Payer: Self-pay | Admitting: Emergency Medicine

## 2019-01-03 DIAGNOSIS — N390 Urinary tract infection, site not specified: Secondary | ICD-10-CM | POA: Diagnosis present

## 2019-01-03 DIAGNOSIS — I878 Other specified disorders of veins: Secondary | ICD-10-CM | POA: Diagnosis present

## 2019-01-03 DIAGNOSIS — T68XXXA Hypothermia, initial encounter: Secondary | ICD-10-CM

## 2019-01-03 DIAGNOSIS — E119 Type 2 diabetes mellitus without complications: Secondary | ICD-10-CM

## 2019-01-03 DIAGNOSIS — R9431 Abnormal electrocardiogram [ECG] [EKG]: Secondary | ICD-10-CM | POA: Diagnosis not present

## 2019-01-03 DIAGNOSIS — E669 Obesity, unspecified: Secondary | ICD-10-CM | POA: Diagnosis present

## 2019-01-03 DIAGNOSIS — E785 Hyperlipidemia, unspecified: Secondary | ICD-10-CM | POA: Diagnosis present

## 2019-01-03 DIAGNOSIS — R001 Bradycardia, unspecified: Secondary | ICD-10-CM

## 2019-01-03 DIAGNOSIS — S01511A Laceration without foreign body of lip, initial encounter: Secondary | ICD-10-CM | POA: Diagnosis present

## 2019-01-03 DIAGNOSIS — Z6838 Body mass index (BMI) 38.0-38.9, adult: Secondary | ICD-10-CM | POA: Diagnosis not present

## 2019-01-03 DIAGNOSIS — F0390 Unspecified dementia without behavioral disturbance: Secondary | ICD-10-CM | POA: Diagnosis present

## 2019-01-03 DIAGNOSIS — G309 Alzheimer's disease, unspecified: Secondary | ICD-10-CM | POA: Diagnosis present

## 2019-01-03 DIAGNOSIS — R600 Localized edema: Secondary | ICD-10-CM | POA: Diagnosis present

## 2019-01-03 DIAGNOSIS — R627 Adult failure to thrive: Secondary | ICD-10-CM | POA: Diagnosis present

## 2019-01-03 DIAGNOSIS — F0280 Dementia in other diseases classified elsewhere without behavioral disturbance: Secondary | ICD-10-CM | POA: Diagnosis present

## 2019-01-03 DIAGNOSIS — Z1159 Encounter for screening for other viral diseases: Secondary | ICD-10-CM | POA: Diagnosis not present

## 2019-01-03 DIAGNOSIS — Z87891 Personal history of nicotine dependence: Secondary | ICD-10-CM | POA: Diagnosis not present

## 2019-01-03 DIAGNOSIS — R52 Pain, unspecified: Secondary | ICD-10-CM | POA: Diagnosis present

## 2019-01-03 DIAGNOSIS — W050XXA Fall from non-moving wheelchair, initial encounter: Secondary | ICD-10-CM | POA: Diagnosis present

## 2019-01-03 DIAGNOSIS — R680 Hypothermia, not associated with low environmental temperature: Secondary | ICD-10-CM | POA: Diagnosis present

## 2019-01-03 DIAGNOSIS — W19XXXA Unspecified fall, initial encounter: Secondary | ICD-10-CM | POA: Diagnosis not present

## 2019-01-03 DIAGNOSIS — F0150 Vascular dementia without behavioral disturbance: Secondary | ICD-10-CM | POA: Diagnosis not present

## 2019-01-03 LAB — CBC WITH DIFFERENTIAL/PLATELET
Abs Immature Granulocytes: 0.03 10*3/uL (ref 0.00–0.07)
Basophils Absolute: 0 10*3/uL (ref 0.0–0.1)
Basophils Relative: 1 %
Eosinophils Absolute: 0.1 10*3/uL (ref 0.0–0.5)
Eosinophils Relative: 1 %
HCT: 45.2 % (ref 36.0–46.0)
Hemoglobin: 13.5 g/dL (ref 12.0–15.0)
Immature Granulocytes: 1 %
Lymphocytes Relative: 27 %
Lymphs Abs: 1.7 10*3/uL (ref 0.7–4.0)
MCH: 29 pg (ref 26.0–34.0)
MCHC: 29.9 g/dL — ABNORMAL LOW (ref 30.0–36.0)
MCV: 97.2 fL (ref 80.0–100.0)
Monocytes Absolute: 0.6 10*3/uL (ref 0.1–1.0)
Monocytes Relative: 9 %
Neutro Abs: 4 10*3/uL (ref 1.7–7.7)
Neutrophils Relative %: 61 %
Platelets: 172 10*3/uL (ref 150–400)
RBC: 4.65 MIL/uL (ref 3.87–5.11)
RDW: 17.2 % — ABNORMAL HIGH (ref 11.5–15.5)
WBC: 6.4 10*3/uL (ref 4.0–10.5)
nRBC: 0 % (ref 0.0–0.2)

## 2019-01-03 LAB — CBC
HCT: 37.5 % (ref 36.0–46.0)
Hemoglobin: 11.1 g/dL — ABNORMAL LOW (ref 12.0–15.0)
MCH: 28.3 pg (ref 26.0–34.0)
MCHC: 29.6 g/dL — ABNORMAL LOW (ref 30.0–36.0)
MCV: 95.7 fL (ref 80.0–100.0)
Platelets: 136 10*3/uL — ABNORMAL LOW (ref 150–400)
RBC: 3.92 MIL/uL (ref 3.87–5.11)
RDW: 17.1 % — ABNORMAL HIGH (ref 11.5–15.5)
WBC: 6 10*3/uL (ref 4.0–10.5)
nRBC: 0 % (ref 0.0–0.2)

## 2019-01-03 LAB — COMPREHENSIVE METABOLIC PANEL
ALT: 21 U/L (ref 0–44)
ALT: 17 U/L (ref 0–44)
AST: 20 U/L (ref 15–41)
AST: 23 U/L (ref 15–41)
Albumin: 3.6 g/dL (ref 3.5–5.0)
Albumin: 2.7 g/dL — ABNORMAL LOW (ref 3.5–5.0)
Alkaline Phosphatase: 76 U/L (ref 38–126)
Alkaline Phosphatase: 60 U/L (ref 38–126)
Anion gap: 10 (ref 5–15)
Anion gap: 9 (ref 5–15)
BUN: 20 mg/dL (ref 8–23)
BUN: 17 mg/dL (ref 8–23)
CO2: 28 mmol/L (ref 22–32)
CO2: 27 mmol/L (ref 22–32)
Calcium: 10.7 mg/dL — ABNORMAL HIGH (ref 8.9–10.3)
Calcium: 9.6 mg/dL (ref 8.9–10.3)
Chloride: 102 mmol/L (ref 98–111)
Chloride: 106 mmol/L (ref 98–111)
Creatinine, Ser: 0.62 mg/dL (ref 0.44–1.00)
Creatinine, Ser: 0.61 mg/dL (ref 0.44–1.00)
GFR calc Af Amer: >60 mL/min (ref >60)
GFR calc Af Amer: >60 mL/min (ref >60)
GFR calc non Af Amer: >60 mL/min (ref >60)
GFR calc non Af Amer: >60 mL/min (ref >60)
Glucose, Bld: 127 mg/dL — ABNORMAL HIGH (ref 70–99)
Glucose, Bld: 121 mg/dL — ABNORMAL HIGH (ref 70–99)
Potassium: 4.3 mmol/L (ref 3.5–5.1)
Potassium: 4.4 mmol/L (ref 3.5–5.1)
Sodium: 140 mmol/L (ref 135–145)
Sodium: 142 mmol/L (ref 135–145)
Total Bilirubin: 0.6 mg/dL (ref 0.3–1.2)
Total Bilirubin: 1 mg/dL (ref 0.3–1.2)
Total Protein: 6.9 g/dL (ref 6.5–8.1)
Total Protein: 5.3 g/dL — ABNORMAL LOW (ref 6.5–8.1)

## 2019-01-03 LAB — URINALYSIS, ROUTINE W REFLEX MICROSCOPIC
Bilirubin Urine: NEGATIVE
Glucose, UA: NEGATIVE mg/dL
Hgb urine dipstick: NEGATIVE
Ketones, ur: NEGATIVE mg/dL
Nitrite: NEGATIVE
Protein, ur: NEGATIVE mg/dL
Specific Gravity, Urine: 1.023 (ref 1.005–1.030)
pH: 5 (ref 5.0–8.0)

## 2019-01-03 LAB — CK TOTAL AND CKMB (NOT AT ARMC)
CK, MB: 10.9 ng/mL — ABNORMAL HIGH (ref 0.5–5.0)
Relative Index: 7.4 — ABNORMAL HIGH (ref 0.0–2.5)
Total CK: 148 U/L (ref 38–234)

## 2019-01-03 LAB — PROTIME-INR
INR: 0.9 (ref 0.8–1.2)
Prothrombin Time: 12.1 seconds (ref 11.4–15.2)

## 2019-01-03 LAB — MRSA PCR SCREENING: MRSA by PCR: NEGATIVE

## 2019-01-03 LAB — TSH: TSH: 3.341 u[IU]/mL (ref 0.350–4.500)

## 2019-01-03 LAB — SARS CORONAVIRUS 2 BY RT PCR (HOSPITAL ORDER, PERFORMED IN ~~LOC~~ HOSPITAL LAB): SARS Coronavirus 2: NEGATIVE

## 2019-01-03 LAB — LACTIC ACID, PLASMA
Lactic Acid, Venous: 1.1 mmol/L (ref 0.5–1.9)
Lactic Acid, Venous: 0.9 mmol/L (ref 0.5–1.9)

## 2019-01-03 LAB — APTT: aPTT: 29 seconds (ref 24–36)

## 2019-01-03 LAB — VALPROIC ACID LEVEL: Valproic Acid Lvl: 44 ug/mL — ABNORMAL LOW (ref 50.0–100.0)

## 2019-01-03 LAB — TROPONIN I (HIGH SENSITIVITY): Troponin I (High Sensitivity): 10 ng/L (ref <18)

## 2019-01-03 MED ORDER — SODIUM CHLORIDE 0.9 % IV SOLN
INTRAVENOUS | Status: DC
  Administered 2019-01-04 – 2019-01-09 (×9): via INTRAVENOUS

## 2019-01-03 MED ORDER — VANCOMYCIN HCL 10 G IV SOLR
2000.0000 mg | Freq: Once | INTRAVENOUS | Status: AC
  Administered 2019-01-03: 2000 mg via INTRAVENOUS
  Filled 2019-01-03: qty 2000

## 2019-01-03 MED ORDER — ESCITALOPRAM OXALATE 10 MG PO TABS
10.0000 mg | ORAL_TABLET | Freq: Every day | ORAL | Status: DC
  Administered 2019-01-05: 10 mg via ORAL
  Filled 2019-01-03 (×2): qty 1

## 2019-01-03 MED ORDER — SODIUM CHLORIDE 0.9 % IV BOLUS
30.0000 mL/kg | Freq: Once | INTRAVENOUS | Status: AC
  Administered 2019-01-03: 3252 mL via INTRAVENOUS

## 2019-01-03 MED ORDER — ACETAMINOPHEN 325 MG PO TABS
650.0000 mg | ORAL_TABLET | Freq: Four times a day (QID) | ORAL | Status: DC | PRN
  Administered 2019-01-11 – 2019-01-13 (×2): 650 mg via ORAL
  Filled 2019-01-03 (×2): qty 2

## 2019-01-03 MED ORDER — TRAZODONE HCL 50 MG PO TABS
50.0000 mg | ORAL_TABLET | Freq: Every day | ORAL | Status: DC
  Administered 2019-01-04: 50 mg via ORAL
  Filled 2019-01-03: qty 1

## 2019-01-03 MED ORDER — ENOXAPARIN SODIUM 40 MG/0.4ML ~~LOC~~ SOLN
40.0000 mg | SUBCUTANEOUS | Status: DC
  Administered 2019-01-03 – 2019-01-13 (×11): 40 mg via SUBCUTANEOUS
  Filled 2019-01-03 (×11): qty 0.4

## 2019-01-03 MED ORDER — VANCOMYCIN HCL IN DEXTROSE 1-5 GM/200ML-% IV SOLN: 1000.0000 mg | Freq: Once | INTRAVENOUS | Status: DC

## 2019-01-03 MED ORDER — ACETAMINOPHEN 650 MG RE SUPP: 650.0000 mg | Freq: Four times a day (QID) | RECTAL | Status: DC | PRN

## 2019-01-03 MED ORDER — SIMVASTATIN 40 MG PO TABS
40.0000 mg | ORAL_TABLET | Freq: Every day | ORAL | Status: DC
  Administered 2019-01-05 – 2019-01-13 (×8): 40 mg via ORAL
  Filled 2019-01-03 (×8): qty 1

## 2019-01-03 MED ORDER — FUROSEMIDE 40 MG PO TABS
40.0000 mg | ORAL_TABLET | ORAL | Status: DC
  Administered 2019-01-05 – 2019-01-13 (×5): 40 mg via ORAL
  Filled 2019-01-03 (×6): qty 1

## 2019-01-03 MED ORDER — METRONIDAZOLE IN NACL 5-0.79 MG/ML-% IV SOLN
500.0000 mg | Freq: Once | INTRAVENOUS | Status: AC
  Administered 2019-01-03: 500 mg via INTRAVENOUS
  Filled 2019-01-03: qty 100

## 2019-01-03 MED ORDER — MEMANTINE HCL ER 28 MG PO CP24
28.0000 mg | ORAL_CAPSULE | Freq: Every day | ORAL | Status: DC
  Administered 2019-01-05: 28 mg via ORAL
  Filled 2019-01-03 (×4): qty 1

## 2019-01-03 MED ORDER — CHLORHEXIDINE GLUCONATE CLOTH 2 % EX PADS
6.0000 | MEDICATED_PAD | Freq: Every day | CUTANEOUS | Status: DC
  Administered 2019-01-03 – 2019-01-04 (×2): 6 via TOPICAL

## 2019-01-03 MED ORDER — SODIUM CHLORIDE 0.9 % IV SOLN
1.0000 g | INTRAVENOUS | Status: DC
  Filled 2019-01-03: qty 10

## 2019-01-03 MED ORDER — KETOCONAZOLE 2 % EX CREA
TOPICAL_CREAM | Freq: Two times a day (BID) | CUTANEOUS | Status: DC
  Administered 2019-01-03 – 2019-01-13 (×20): via TOPICAL
  Filled 2019-01-03: qty 15

## 2019-01-03 MED ORDER — SODIUM CHLORIDE 0.9 % IV SOLN
INTRAVENOUS | Status: DC
  Administered 2019-01-03: 07:00:00 via INTRAVENOUS

## 2019-01-03 MED ORDER — POTASSIUM CHLORIDE CRYS ER 20 MEQ PO TBCR: 20.0000 meq | EXTENDED_RELEASE_TABLET | Freq: Every day | ORAL | Status: DC

## 2019-01-03 MED ORDER — SODIUM CHLORIDE 0.9 % IV SOLN
2.0000 g | Freq: Once | INTRAVENOUS | Status: AC
  Administered 2019-01-03: 2 g via INTRAVENOUS
  Filled 2019-01-03: qty 2

## 2019-01-03 MED ORDER — ORAL CARE MOUTH RINSE
15.0000 mL | Freq: Two times a day (BID) | OROMUCOSAL | Status: DC
  Administered 2019-01-03 – 2019-01-12 (×14): 15 mL via OROMUCOSAL

## 2019-01-03 MED ORDER — SODIUM CHLORIDE 0.9 % IV SOLN
2.0000 g | INTRAVENOUS | Status: DC
  Administered 2019-01-03 – 2019-01-07 (×5): 2 g via INTRAVENOUS
  Filled 2019-01-03 (×5): qty 2

## 2019-01-03 MED ORDER — NYSTATIN 100000 UNIT/GM EX POWD
Freq: Two times a day (BID) | CUTANEOUS | Status: DC | PRN
  Administered 2019-01-09: 22:00:00 via TOPICAL
  Filled 2019-01-03: qty 15

## 2019-01-03 MED ORDER — DIAZEPAM 2 MG PO TABS
2.0000 mg | ORAL_TABLET | Freq: Two times a day (BID) | ORAL | Status: DC
  Administered 2019-01-04 – 2019-01-05 (×3): 2 mg via ORAL
  Filled 2019-01-03 (×3): qty 1

## 2019-01-03 NOTE — H&P (Signed)
TRH H&P    Patient Demographics:    Penny Carr, is a 73 y.o. female  MRN: [id number]  DOB - 1946/05/06  Admit Date - 01/02/2019  Referring MD/NP/PA:   April Palumbo  Outpatient Primary MD for the patient is Samuel JesterButler, Cynthia, DO  [facility]  Chief complaint- fall and hypothermia   HPI:    Penny Carr  is a 73 y.o. female, w dementia, apparently has witnessed fall out of wheel chair, laceration on the upper lip and presents to ER for evaluation.    In ED,   T 92  P 50  R 18 Bp 149/69  Pox 99% on RA  CT brain , C spine IMPRESSION: 1. No acute intracranial abnormality or calvarial fracture. 2. Stable chronic microvascular ischemic changes and volume loss of the brain. 3. No acute fracture or dislocation of cervical spine. 4. Stable cervical spondylosis greatest at the C5 through C7 levels. Moderate to severe C5-6 spinal canal stenosis.  CXR IMPRESSION: No acute cardiopulmonary abnormality or acute traumatic injury Identified.  Xray hip IMPRESSION: No acute fracture or dislocation identified about the bilateral hips or pelvis.  Na 140, K 4.3, Bun 20, Creatinine 0.62 Calcium 10.7  Alb 3.6 Ast 20, Alt 21 Wbc 6.4, hgb 13.5, Plt 172  INR 0.9  Urinalysis  Wbc 11-20  Rbc 0-5  Covid negative  Pt will be admitted for UTI, hypothermia, and bradycardia      Review of systems:    In addition to the HPI above, unable to obtain clearly due to dementia No Fever-chills, No Headache, No changes with Vision or hearing, No problems swallowing food or Liquids, No Chest pain, Cough or Shortness of Breath, No Abdominal pain, No Nausea or Vomiting, bowel movements are regular, No Blood in stool or Urine, No dysuria, No new skin rashes No new joints pains-aches,  No new weakness, tingling, numbness in any extremity, No recent weight gain or loss, No polyuria,  polydypsia or polyphagia, No significant Mental Stressors.  All other systems reviewed and are negative.    Past History of the following :    Past Medical History:  Diagnosis Date   Alzheimer disease (HCC)    Diabetes mellitus without complication (HCC)       History reviewed. No pertinent surgical history. Unable to obtain due to dementia   Social History:      Social History   Tobacco Use   Smoking status: Former Smoker   Smokeless tobacco: Never Used  Substance Use Topics   Alcohol use: No       Family History :    No family history on file. Unable to obtain due to dementia    Home Medications:   Prior to Admission medications   Medication Sig Start Date End Date Taking? Authorizing Provider  acetaminophen (TYLENOL) 500 MG tablet Take 500 mg by mouth every 4 (four) hours as needed for mild pain.   Yes [provider]  alum & mag hydroxide-simeth (MINTOX) 200-200-20 MG/5ML suspension Take 30 mLs  by mouth every 6 (six) hours as needed for indigestion or heartburn.   Yes [provider]  Cranberry 500 MG CAPS Take 500 mg by mouth 2 (two) times daily.   Yes [provider]  diazepam (VALIUM) 5 MG tablet Take 5 mg by mouth 2 (two) times a day.   Yes [provider]  divalproex (DEPAKOTE SPRINKLE) 125 MG capsule Take 250 mg by mouth 2 (two) times daily.   Yes [provider]  ergocalciferol (VITAMIN D2) 1.25 MG (50000 UT) capsule Take 50,000 Units by mouth once a week. On Saturday   Yes [provider]  escitalopram (LEXAPRO) 10 MG tablet Take 10 mg by mouth daily.   Yes [provider]  furosemide (LASIX) 40 MG tablet Take 40 mg by mouth every other day.   Yes [provider]  guaifenesin (ROBITUSSIN) 100 MG/5ML syrup Take 200 mg by mouth 4 (four) times daily as needed for cough.   Yes [provider]  haloperidol (HALDOL) 2 MG tablet Take 2 mg by mouth 3 (three) times daily.   Yes  [provider]  loperamide (IMODIUM) 2 MG capsule Take 2 mg by mouth as needed for diarrhea or loose stools.   Yes [provider]  LORazepam (ATIVAN) 0.5 MG tablet Take 1 tablet (0.5 mg total) by mouth at bedtime. Patient taking differently: Take 0.5 mg by mouth every 8 (eight) hours as needed for anxiety.  06/25/18  Yes Domenic Polite, MD  magnesium hydroxide (MILK OF MAGNESIA) 400 MG/5ML suspension Take 30 mLs by mouth at bedtime as needed for mild constipation.   Yes [provider]  memantine (NAMENDA XR) 28 MG CP24 24 hr capsule Take 28 mg by mouth daily.   Yes [provider]  Menthol-Zinc Oxide (CALMOSEPTINE) 0.44-20.6 % OINT Apply 1 application topically 2 (two) times daily as needed (rash/irritation).   Yes [provider]  neomycin-bacitracin-polymyxin (NEOSPORIN) ointment Apply 1 application topically as needed for wound care.    Yes [provider]  nystatin (NYSTATIN) powder Apply 1 g topically 2 (two) times daily as needed (candidiasis rash).   Yes [provider]  potassium chloride SA (K-DUR,KLOR-CON) 20 MEQ tablet Take 20 mEq by mouth daily.   Yes [provider]  simvastatin (ZOCOR) 40 MG tablet Take 40 mg by mouth daily.   Yes [provider]  traZODone (DESYREL) 50 MG tablet Take 50 mg by mouth at bedtime.   Yes [provider]  HYDROcodone-acetaminophen (NORCO/VICODIN) 5-325 MG tablet Take 1 tablet by mouth 2 (two) times a day.    [provider]     Allergies:    No Known Allergies   Physical Exam:   Vitals  Blood pressure (!) 110/57, pulse (!) 51, temperature (!) 92.6 F (33.7 C), temperature source Rectal, resp. rate 15, height 5\' 6"  (1.676 m), weight 108.4 kg, SpO2 98 %.  1.  General: axox1   2. Psychiatric: euthymic  3. Neurologic: cn2-12 intact, reflexes 2+ symmetric, diffuse with no clonus, motor 5/5 in all 4 ext  4. HEENMT:  Anicteric, pupils 1.17mm  symmetric, direct, consensual, intact Neck: no jvd,    5. Respiratory : CTAB  6. Cardiovascular : Brady s1, s2,   7. Gastrointestinal:  Abd: soft, nt, nd, obese, +bs  8. Skin:  Slight crusted blood on upper lip,  Ext:  Wrapped in unna boots  9.Musculoskeletal:  Good ROM  No adenopathy    Data Review:    CBC  Recent Labs  Lab 01/03/19 0005  WBC 6.4  HGB 13.5  HCT 45.2  PLT 172  MCV 97.2  MCH 29.0  MCHC 29.9*  RDW 17.2*  LYMPHSABS 1.7  MONOABS 0.6  EOSABS 0.1  BASOSABS 0.0   ------------------------------------------------------------------------------------------------------------------  Results for orders placed or performed during the hospital encounter of 01/02/19 (from the past 48 hour(s))  POC CBG, ED     Status: Abnormal   Collection Time: 01/02/19 11:39 PM  Result Value Ref Range   Glucose-Capillary 112 (H) 70 - 99 mg/dL  Lactic acid, plasma     Status: None   Collection Time: 01/03/19 12:05 AM  Result Value Ref Range   Lactic Acid, Venous 1.1 0.5 - 1.9 mmol/L    Comment: Performed at [facility], [address] [place], [place] [address]  Comprehensive metabolic panel     Status: Abnormal   Collection Time: 01/03/19 12:05 AM  Result Value Ref Range   Sodium 140 135 - 145 mmol/L   Potassium 4.3 3.5 - 5.1 mmol/L   Chloride 102 98 - 111 mmol/L   CO2 28 22 - 32 mmol/L   Glucose, Bld 127 (H) 70 - 99 mg/dL   BUN 20 8 - 23 mg/dL   Creatinine, Ser 6.04 0.44 - 1.00 mg/dL   Calcium 54.0 (H) 8.9 - 10.3 mg/dL   Total Protein 6.9 6.5 - 8.1 g/dL   Albumin 3.6 3.5 - 5.0 g/dL   AST 20 15 - 41 U/L   ALT 21 0 - 44 U/L   Alkaline Phosphatase 76 38 - 126 U/L   Total Bilirubin 0.6 0.3 - 1.2 mg/dL   GFR calc non Af Amer >60 >60 mL/min   GFR calc Af Amer >60 >60 mL/min   Anion gap 10 5 - 15    Comment: Performed at [facility], [address] [place], [place] [address]  CBC WITH DIFFERENTIAL     Status: Abnormal    Collection Time: 01/03/19 12:05 AM  Result Value Ref Range   WBC 6.4 4.0 - 10.5 K/uL   RBC 4.65 3.87 - 5.11 MIL/uL   Hemoglobin 13.5 12.0 - 15.0 g/dL   HCT 14.7 82.9 - 56.2 %   MCV 97.2 80.0 - 100.0 fL   MCH 29.0 26.0 - 34.0 pg   MCHC 29.9 (L) 30.0 - 36.0 g/dL   RDW 13.0 (H) 86.5 - 78.4 %   Platelets 172 150 - 400 K/uL   nRBC 0.0 0.0 - 0.2 %   Neutrophils Relative % 61 %   Neutro Abs 4.0 1.7 - 7.7 K/uL   Lymphocytes Relative 27 %   Lymphs Abs 1.7 0.7 - 4.0 K/uL   Monocytes Relative 9 %   Monocytes Absolute 0.6 0.1 - 1.0 K/uL   Eosinophils Relative 1 %   Eosinophils Absolute 0.1 0.0 - 0.5 K/uL   Basophils Relative 1 %   Basophils Absolute 0.0 0.0 - 0.1 K/uL   Immature Granulocytes 1 %   Abs Immature Granulocytes 0.03 0.00 - 0.07 K/uL    Comment: Performed at [facility], [address] [place], [place] [address]  APTT     Status: None   Collection Time: 01/03/19 12:05 AM  Result Value Ref Range   aPTT 29 24 - 36 seconds    Comment: Performed at [facility], [address] [place], [place] [address]  Protime-INR     Status: None   Collection Time: 01/03/19  12:05 AM  Result Value Ref Range   Prothrombin Time 12.1 11.4 - 15.2 seconds   INR 0.9 0.8 - 1.2    Comment: (NOTE) INR goal varies based on device and disease states. Performed at [facility], [address] [place], [place] [address]   Urinalysis, Routine w reflex microscopic     Status: Abnormal   Collection Time: 01/03/19 12:05 AM  Result Value Ref Range   Color, Urine YELLOW YELLOW   APPearance CLEAR CLEAR   Specific Gravity, Urine 1.023 1.005 - 1.030   pH 5.0 5.0 - 8.0   Glucose, UA NEGATIVE NEGATIVE mg/dL   Hgb urine dipstick NEGATIVE NEGATIVE   Bilirubin Urine NEGATIVE NEGATIVE   Ketones, ur NEGATIVE NEGATIVE mg/dL   Protein, ur NEGATIVE NEGATIVE mg/dL   Nitrite NEGATIVE NEGATIVE   Leukocytes,Ua TRACE (A) NEGATIVE   RBC / HPF 0-5 0 - 5 RBC/hpf    WBC, UA 11-20 0 - 5 WBC/hpf   Bacteria, UA RARE (A) NONE SEEN   Mucus PRESENT     Comment: Performed at [facility], [address] [place], [place] [address]  SARS Coronavirus 2 (CEPHEID - Performed in [facility] hospital lab), Hosp Order     Status: None   Collection Time: 01/03/19 12:06 AM   Specimen: Nasopharyngeal Swab  Result Value Ref Range   SARS Coronavirus 2 NEGATIVE NEGATIVE    Comment: (NOTE) If result is NEGATIVE SARS-CoV-2 target nucleic acids are NOT DETECTED. The SARS-CoV-2 RNA is generally detectable in upper and lower  respiratory specimens during the acute phase of infection. The lowest  concentration of SARS-CoV-2 viral copies this assay can detect is 250  copies / mL. A negative result does not preclude SARS-CoV-2 infection  and should not be used as the sole basis for treatment or other  patient management decisions.  A negative result may occur with  improper specimen collection / handling, submission of specimen other  than nasopharyngeal swab, presence of viral mutation(s) within the  areas targeted by this assay, and inadequate number of viral copies  (<250 copies / mL). A negative result must be combined with clinical  observations, patient history, and epidemiological information. If result is POSITIVE SARS-CoV-2 target nucleic acids are DETECTED. The SARS-CoV-2 RNA is generally detectable in upper and lower  respiratory specimens dur ing the acute phase of infection.  Positive  results are indicative of active infection with SARS-CoV-2.  Clinical  correlation with patient history and other diagnostic information is  necessary to determine patient infection status.  Positive results do  not rule out bacterial infection or co-infection with other viruses. If result is PRESUMPTIVE POSTIVE SARS-CoV-2 nucleic acids MAY BE PRESENT.   A presumptive positive result was obtained on the submitted specimen  and confirmed on repeat testing.   While 2019 novel coronavirus  (SARS-CoV-2) nucleic acids may be present in the submitted sample  additional confirmatory testing may be necessary for epidemiological  and / or clinical management purposes  to differentiate between  SARS-CoV-2 and other Sarbecovirus currently known to infect humans.  If clinically indicated additional testing with an alternate test  methodology [id number]) is advised. The SARS-CoV-2 RNA is generally  detectable in upper and lower respiratory sp ecimens during the acute  phase of infection. The expected result is Negative. Fact Sheet for Patients:  BoilerBrush.com.cyhttps://www.fda.gov/media/136312/download Fact Sheet for Healthcare Providers: https://pope.com/https://www.fda.gov/media/136313/download This test is not yet approved or cleared by the [place] FDA and has been authorized  for detection and/or diagnosis of SARS-CoV-2 by FDA under an Emergency Use Authorization (EUA).  This EUA will remain in effect (meaning this test can be used) for the duration of the COVID-19 declaration under Section 564(b)(1) of the Act, 21 U.S.C. section 360bbb-3(b)(1), unless the authorization is terminated or revoked sooner. Performed at [facility], [address], [place], [place] [address]   Valproic acid level     Status: Abnormal   Collection Time: 01/03/19  1:10 AM  Result Value Ref Range   Valproic Acid Lvl 44 (L) 50.0 - 100.0 ug/mL    Comment: Performed at [facility], [address] [place], [place] [address]  Lactic acid, plasma     Status: None   Collection Time: 01/03/19  2:24 AM  Result Value Ref Range   Lactic Acid, Venous 0.9 0.5 - 1.9 mmol/L    Comment: Performed at [facility], [address] [place], [place] [address]  TSH     Status: None   Collection Time: 01/03/19  2:24 AM  Result Value Ref Range   TSH 3.341 0.350 - 4.500 uIU/mL    Comment: Performed by a 3rd Generation assay with a functional sensitivity of  <=0.01 uIU/mL. Performed at [facility], [address] [place], [place] [address]     Chemistries  Recent Labs  Lab 01/03/19 0005  NA 140  K 4.3  CL 102  CO2 28  GLUCOSE 127*  BUN 20  CREATININE 0.62  CALCIUM 10.7*  AST 20  ALT 21  ALKPHOS 76  BILITOT 0.6   ------------------------------------------------------------------------------------------------------------------  ------------------------------------------------------------------------------------------------------------------ GFR: Estimated Creatinine Clearance: 75.7 mL/min (by C-G formula based on SCr of 0.62 mg/dL). Liver Function Tests: Recent Labs  Lab 01/03/19 0005  AST 20  ALT 21  ALKPHOS 76  BILITOT 0.6  PROT 6.9  ALBUMIN 3.6   No results for input(s): LIPASE, AMYLASE in the last 168 hours. No results for input(s): AMMONIA in the last 168 hours. Coagulation Profile: Recent Labs  Lab 01/03/19 0005  INR 0.9   Cardiac Enzymes: No results for input(s): CKTOTAL, CKMB, CKMBINDEX, TROPONINI in the last 168 hours. BNP (last 3 results) No results for input(s): PROBNP in the last 8760 hours. HbA1C: No results for input(s): HGBA1C in the last 72 hours. CBG: Recent Labs  Lab 01/02/19 2339  GLUCAP 112*   Lipid Profile: No results for input(s): CHOL, HDL, LDLCALC, TRIG, CHOLHDL, LDLDIRECT in the last 72 hours. Thyroid Function Tests: Recent Labs    01/03/19 0224  TSH 3.341   Anemia Panel: No results for input(s): VITAMINB12, FOLATE, FERRITIN, TIBC, IRON, RETICCTPCT in the last 72 hours.  --------------------------------------------------------------------------------------------------------------- Urine analysis:    Component Value Date/Time   COLORURINE YELLOW 01/03/2019 0005   APPEARANCEUR CLEAR 01/03/2019 0005   LABSPEC 1.023 01/03/2019 0005   PHURINE 5.0 01/03/2019 0005   GLUCOSEU NEGATIVE 01/03/2019 0005   HGBUR NEGATIVE 01/03/2019 0005   BILIRUBINUR NEGATIVE  01/03/2019 0005   KETONESUR NEGATIVE 01/03/2019 0005   PROTEINUR NEGATIVE 01/03/2019 0005   UROBILINOGEN 0.2 08/22/2010 1640   NITRITE NEGATIVE 01/03/2019 0005   LEUKOCYTESUR TRACE (A) 01/03/2019 0005      Imaging Results:    Dg Chest 1 View  Result Date: 01/03/2019 CLINICAL DATA:  73 year old female status post witnessed fall. EXAM: CHEST  1 VIEW COMPARISON:  Chest radiographs 06/21/2018 and earlier. FINDINGS: Supine AP view at 0014 hours. Mildly lower lung volumes. Stable cardiomegaly and mediastinal contours. Visualized tracheal  air column is within normal limits. Allowing for portable technique the lungs are clear. Paucity of bowel gas in the upper abdomen. No acute osseous abnormality identified. IMPRESSION: No acute cardiopulmonary abnormality or acute traumatic injury identified. Electronically Signed   By: Odessa Fleming M.D.   On: 01/03/2019 00:36   Ct Head Wo Contrast  Result Date: 01/03/2019 CLINICAL DATA:  73 y/o F; fall from wheelchair, laceration to upper lip. History of Alzheimer's and diabetes. EXAM: CT HEAD WITHOUT CONTRAST CT CERVICAL SPINE WITHOUT CONTRAST TECHNIQUE: Multidetector CT imaging of the head and cervical spine was performed following the standard protocol without intravenous contrast. Multiplanar CT image reconstructions of the cervical spine were also generated. COMPARISON:  06/20/2018 CT head. 05/25/2018 CT cervical spine. FINDINGS: CT HEAD FINDINGS Brain: No evidence of acute infarction, hemorrhage, hydrocephalus, extra-axial collection or mass lesion/mass effect. Stable chronic microvascular ischemic changes and volume loss of the brain. Vascular: No hyperdense vessel or unexpected calcification. Skull: Normal. Negative for fracture or focal lesion. Sinuses/Orbits: No acute finding. Other: None. CT CERVICAL SPINE FINDINGS Alignment: Straightening of cervical lordosis. Skull base and vertebrae: No acute fracture. No primary bone lesion or focal pathologic process. Soft  tissues and spinal canal: No prevertebral fluid or swelling. No visible canal hematoma. Disc levels: Stable cervical spondylosis with predominant discogenic degenerative changes greatest at the C5 through C7 levels. Moderate to severe C5-6 spinal canal stenosis. Uncovertebral hypertrophy encroach on bilateral C5-C7 neural foramen. Upper chest: Negative. Other: Aortic and carotid bifurcation calcific atherosclerosis. IMPRESSION: 1. No acute intracranial abnormality or calvarial fracture. 2. Stable chronic microvascular ischemic changes and volume loss of the brain. 3. No acute fracture or dislocation of cervical spine. 4. Stable cervical spondylosis greatest at the C5 through C7 levels. Moderate to severe C5-6 spinal canal stenosis. Electronically Signed   By: Mitzi Hansen M.D.   On: 01/03/2019 01:55   Ct Cervical Spine Wo Contrast  Result Date: 01/03/2019 CLINICAL DATA:  73 y/o F; fall from wheelchair, laceration to upper lip. History of Alzheimer's and diabetes. EXAM: CT HEAD WITHOUT CONTRAST CT CERVICAL SPINE WITHOUT CONTRAST TECHNIQUE: Multidetector CT imaging of the head and cervical spine was performed following the standard protocol without intravenous contrast. Multiplanar CT image reconstructions of the cervical spine were also generated. COMPARISON:  06/20/2018 CT head. 05/25/2018 CT cervical spine. FINDINGS: CT HEAD FINDINGS Brain: No evidence of acute infarction, hemorrhage, hydrocephalus, extra-axial collection or mass lesion/mass effect. Stable chronic microvascular ischemic changes and volume loss of the brain. Vascular: No hyperdense vessel or unexpected calcification. Skull: Normal. Negative for fracture or focal lesion. Sinuses/Orbits: No acute finding. Other: None. CT CERVICAL SPINE FINDINGS Alignment: Straightening of cervical lordosis. Skull base and vertebrae: No acute fracture. No primary bone lesion or focal pathologic process. Soft tissues and spinal canal: No prevertebral  fluid or swelling. No visible canal hematoma. Disc levels: Stable cervical spondylosis with predominant discogenic degenerative changes greatest at the C5 through C7 levels. Moderate to severe C5-6 spinal canal stenosis. Uncovertebral hypertrophy encroach on bilateral C5-C7 neural foramen. Upper chest: Negative. Other: Aortic and carotid bifurcation calcific atherosclerosis. IMPRESSION: 1. No acute intracranial abnormality or calvarial fracture. 2. Stable chronic microvascular ischemic changes and volume loss of the brain. 3. No acute fracture or dislocation of cervical spine. 4. Stable cervical spondylosis greatest at the C5 through C7 levels. Moderate to severe C5-6 spinal canal stenosis. Electronically Signed   By: Mitzi Hansen M.D.   On: 01/03/2019 01:55   Dg Hips Bilat W Or Wo  Pelvis 5 Views  Result Date: 01/03/2019 CLINICAL DATA:  73 year old female status post witnessed fall. EXAM: DG HIP (WITH OR WITHOUT PELVIS) 5+V BILAT COMPARISON:  CT Abdomen and Pelvis 06/20/2018. FINDINGS: Femoral heads are normally located. Hip joint spaces appear stable and symmetric. Osteopenia. No pelvic fracture identified. SI joints appear stable and within normal limits. Negative visible abdominal and pelvic visceral contours, chronic pelvic phleboliths. Proximal femur bone detail is suboptimal due to body habitus. No proximal femur fracture identified. IMPRESSION: No acute fracture or dislocation identified about the bilateral hips or pelvis. Electronically Signed   By: Odessa FlemingH  Hall M.D.   On: 01/03/2019 00:39    Ekg sinus brady at 45, nl axis, nl int, no st-t changes c/w ischemia    Assessment & Plan:    Principal Problem:   Hypothermia Active Problems:   Acute lower UTI   Bradycardia   Diabetes (HCC)   Dementia (HCC)   Hypothermia ? Secondary to early sepsis No osborne j wave on ekg Blood culture x2 Check cpk STOP Depakote -> can cause hypothermia STOP Haldol -> can cause hypothermia  Acute  lower uti Blood culture x2 Urine culture Rocephin 1gm iv qday  Bradycardia (chronic), asymptomatic Tele Trop I q2h x2 Cardiac echo NOTE Namenda, Lexapro, Depakote can cause bradycardia  Dm2 fsbs ac and qhs, ISS  Hyperlipidemia Cont Simvastatin 40mg  po qhs  Dementia Cont Namenda XR 28mg  po qday Cont Valium 5mg  po bid => 2.5mg  po bid  Cont Lexapro 10mg  po qday      DVT Prophylaxis-   Lovenox - SCDs   AM Labs Ordered, also please review Full Orders  Family Communication: Admission, patients condition and plan of care including tests being ordered have been discussed with the patient  who indicate understanding and agree with the plan and Code Status.  Code Status:  FULL CODE , notified son Lars PinksJames Heathman that patient [facility] and hypothermia  Admission status: Observation/Inpatient: Based on patients clinical presentation and evaluation of above clinical data, I have made determination that patient meets observation criteria at this time. May require change to inpatient status if hypothermia persistent,   Time spent in minutes : [id number]    Pearson GrippeJames Febe Champa M.D on 01/03/2019 at 3:35 AM

## 2019-01-03 NOTE — Progress Notes (Signed)
Unable to complete admission questions due to pt being confused and not responding appropriately to questioning. Will continue to monitor to attempt to complete admission.

## 2019-01-03 NOTE — ED Provider Notes (Addendum)
[facility]-EMERGENCY DEPT Provider Note   CSN: [id number] Arrival date & time: 01/02/19  2313     History   Chief Complaint Chief Complaint  Patient presents with   Fall    HPI Penny Carr is a 73 y.o. female.     The history is provided by the EMS personnel. The history is limited by the condition of the patient.  Fall This is a new problem. The current episode started less than 1 hour ago. The problem occurs constantly. The problem has not changed since onset.Pertinent negatives include no abdominal pain. Nothing aggravates the symptoms. Nothing relieves the symptoms. She has tried nothing for the symptoms. The treatment provided no relief.  Patient with Alzheimer's fell from wheelchair.  Noted to have decreased core temperature.    Past Medical History:  Diagnosis Date   Alzheimer disease (HCC)    Diabetes mellitus without complication [facility])     Patient Active Problem List   Diagnosis Date Noted   History of sinus bradycardia 07/22/2018   DIVERTICULITIS, HX OF 03/26/2010    History reviewed. No pertinent surgical history.   OB History   No obstetric history on file.      Home Medications    Prior to Admission medications   Medication Sig Start Date End Date Taking? Authorizing Provider  acetaminophen (TYLENOL) 500 MG tablet Take 500 mg by mouth every 4 (four) hours as needed for mild pain.   Yes [provider]  alum & mag hydroxide-simeth (MINTOX) 200-200-20 MG/5ML suspension Take 30 mLs by mouth every 6 (six) hours as needed for indigestion or heartburn.   Yes [provider]  Cranberry 500 MG CAPS Take 500 mg by mouth 2 (two) times daily.   Yes [provider]  diazepam (VALIUM) 5 MG tablet Take 5 mg by mouth 2 (two) times a day.   Yes [provider]  divalproex (DEPAKOTE SPRINKLE) 125 MG capsule Take 250 mg by mouth 2 (two) times daily.   Yes [provider]  ergocalciferol  (VITAMIN D2) 1.25 MG (50000 UT) capsule Take 50,000 Units by mouth once a week. On Saturday   Yes [provider]  escitalopram (LEXAPRO) 10 MG tablet Take 10 mg by mouth daily.   Yes [provider]  furosemide (LASIX) 40 MG tablet Take 40 mg by mouth every other day.   Yes [provider]  guaifenesin (ROBITUSSIN) 100 MG/5ML syrup Take 200 mg by mouth 4 (four) times daily as needed for cough.   Yes [provider]  haloperidol (HALDOL) 2 MG tablet Take 2 mg by mouth 3 (three) times daily.   Yes [provider]  loperamide (IMODIUM) 2 MG capsule Take 2 mg by mouth as needed for diarrhea or loose stools.   Yes [provider]  LORazepam (ATIVAN) 0.5 MG tablet Take 1 tablet (0.5 mg total) by mouth at bedtime. Patient taking differently: Take 0.5 mg by mouth every 8 (eight) hours as needed for anxiety.  06/25/18  Yes Zannie Cove, MD  magnesium hydroxide (MILK OF MAGNESIA) 400 MG/5ML suspension Take 30 mLs by mouth at bedtime as needed for mild constipation.   Yes [provider]  memantine (NAMENDA XR) 28 MG CP24 24 hr capsule Take 28 mg by mouth daily.   Yes [provider]  Menthol-Zinc Oxide (CALMOSEPTINE) 0.44-20.6 % OINT Apply 1 application topically 2 (two) times daily as needed (rash/irritation).   Yes [provider]  neomycin-bacitracin-polymyxin (NEOSPORIN) ointment Apply  1 application topically as needed for wound care.    Yes [provider]  nystatin (NYSTATIN) powder Apply 1 g topically 2 (two) times daily as needed (candidiasis rash).   Yes [provider]  potassium chloride SA (K-DUR,KLOR-CON) 20 MEQ tablet Take 20 mEq by mouth daily.   Yes [provider]  simvastatin (ZOCOR) 40 MG tablet Take 40 mg by mouth daily.   Yes [provider]  traZODone (DESYREL) 50 MG tablet Take 50 mg by mouth at bedtime.   Yes [provider]  HYDROcodone-acetaminophen  (NORCO/VICODIN) 5-325 MG tablet Take 1 tablet by mouth 2 (two) times a day.    [provider]    Family History No family history on file.  Social History Social History   Tobacco Use   Smoking status: Former Smoker   Smokeless tobacco: Never Used  Substance Use Topics   Alcohol use: No   Drug use: No     Allergies   Patient has no known allergies.   Review of Systems Review of Systems  Unable to perform ROS: Dementia  Constitutional: Negative for diaphoresis and fever.  Respiratory: Negative for cough.   Gastrointestinal: Negative for abdominal pain.     Physical Exam Updated Vital Signs BP 108/60    Pulse (!) 44    Temp (!) 92 F (33.3 C) (Rectal)    Resp 13    SpO2 94%   Physical Exam Vitals signs and nursing note reviewed.  Constitutional:      Appearance: She is obese. She is not ill-appearing.  HENT:     Head:     Comments: Bit inside of lip     Nose: Nose normal.  Eyes:     Conjunctiva/sclera: Conjunctivae normal.     Pupils: Pupils are equal, round, and reactive to light.  Neck:     Musculoskeletal: Normal range of motion and neck supple.  Cardiovascular:     Rate and Rhythm: Normal rate and regular rhythm.     Pulses: Normal pulses.     Heart sounds: Normal heart sounds.  Pulmonary:     Effort: Pulmonary effort is normal.     Breath sounds: Normal breath sounds.  Abdominal:     General: Abdomen is flat. Bowel sounds are normal.     Tenderness: There is no abdominal tenderness. There is no guarding or rebound.  Musculoskeletal: Normal range of motion.     Right lower leg: Edema present.     Left lower leg: Edema present.  Skin:    General: Skin is warm and dry.     Capillary Refill: Capillary refill takes less than 2 seconds.  Neurological:     Mental Status: She is alert.     Deep Tendon Reflexes: Reflexes normal.      ED Treatments / Results  Labs (all labs ordered are listed, but only abnormal results are  displayed) Results for orders placed or performed during the hospital encounter of 01/02/19  Lactic acid, plasma  Result Value Ref Range   Lactic Acid, Venous 1.1 0.5 - 1.9 mmol/L  Comprehensive metabolic panel  Result Value Ref Range   Sodium 140 135 - 145 mmol/L   Potassium 4.3 3.5 - 5.1 mmol/L   Chloride 102 98 - 111 mmol/L   CO2 28 22 - 32 mmol/L   Glucose, Bld 127 (H) 70 - 99 mg/dL   BUN 20 8 - 23 mg/dL   Creatinine, Ser 1.61 0.44 - 1.00 mg/dL  Calcium 10.7 (H) 8.9 - 10.3 mg/dL   Total Protein 6.9 6.5 - 8.1 g/dL   Albumin 3.6 3.5 - 5.0 g/dL   AST 20 15 - 41 U/L   ALT 21 0 - 44 U/L   Alkaline Phosphatase 76 38 - 126 U/L   Total Bilirubin 0.6 0.3 - 1.2 mg/dL   GFR calc non Af Amer >60 >60 mL/min   GFR calc Af Amer >60 >60 mL/min   Anion gap 10 5 - 15  CBC WITH DIFFERENTIAL  Result Value Ref Range   WBC 6.4 4.0 - 10.5 K/uL   RBC 4.65 3.87 - 5.11 MIL/uL   Hemoglobin 13.5 12.0 - 15.0 g/dL   HCT 45.445.2 09.836.0 - 11.946.0 %   MCV 97.2 80.0 - 100.0 fL   MCH 29.0 26.0 - 34.0 pg   MCHC 29.9 (L) 30.0 - 36.0 g/dL   RDW 14.717.2 (H) 82.911.5 - 56.215.5 %   Platelets 172 150 - 400 K/uL   nRBC 0.0 0.0 - 0.2 %   Neutrophils Relative % 61 %   Neutro Abs 4.0 1.7 - 7.7 K/uL   Lymphocytes Relative 27 %   Lymphs Abs 1.7 0.7 - 4.0 K/uL   Monocytes Relative 9 %   Monocytes Absolute 0.6 0.1 - 1.0 K/uL   Eosinophils Relative 1 %   Eosinophils Absolute 0.1 0.0 - 0.5 K/uL   Basophils Relative 1 %   Basophils Absolute 0.0 0.0 - 0.1 K/uL   Immature Granulocytes 1 %   Abs Immature Granulocytes 0.03 0.00 - 0.07 K/uL  APTT  Result Value Ref Range   aPTT 29 24 - 36 seconds  Protime-INR  Result Value Ref Range   Prothrombin Time 12.1 11.4 - 15.2 seconds   INR 0.9 0.8 - 1.2  Urinalysis, Routine w reflex microscopic  Result Value Ref Range   Color, Urine YELLOW YELLOW   APPearance CLEAR CLEAR   Specific Gravity, Urine 1.023 1.005 - 1.030   pH 5.0 5.0 - 8.0   Glucose, UA NEGATIVE NEGATIVE mg/dL   Hgb urine  dipstick NEGATIVE NEGATIVE   Bilirubin Urine NEGATIVE NEGATIVE   Ketones, ur NEGATIVE NEGATIVE mg/dL   Protein, ur NEGATIVE NEGATIVE mg/dL   Nitrite NEGATIVE NEGATIVE   Leukocytes,Ua TRACE (A) NEGATIVE   RBC / HPF 0-5 0 - 5 RBC/hpf   WBC, UA 11-20 0 - 5 WBC/hpf   Bacteria, UA RARE (A) NONE SEEN   Mucus PRESENT   Valproic acid level  Result Value Ref Range   Valproic Acid Lvl 44 (L) 50.0 - 100.0 ug/mL  POC CBG, ED  Result Value Ref Range   Glucose-Capillary 112 (H) 70 - 99 mg/dL   Dg Chest 1 View  Result Date: 01/03/2019 CLINICAL DATA:  73 year old female status post witnessed fall. EXAM: CHEST  1 VIEW COMPARISON:  Chest radiographs 06/21/2018 and earlier. FINDINGS: Supine AP view at 0014 hours. Mildly lower lung volumes. Stable cardiomegaly and mediastinal contours. Visualized tracheal air column is within normal limits. Allowing for portable technique the lungs are clear. Paucity of bowel gas in the upper abdomen. No acute osseous abnormality identified. IMPRESSION: No acute cardiopulmonary abnormality or acute traumatic injury identified. Electronically Signed   By: Odessa FlemingH  Hall M.D.   On: 01/03/2019 00:36   Ct Head Wo Contrast  Result Date: 01/03/2019 CLINICAL DATA:  73 y/o F; fall from wheelchair, laceration to upper lip. History of Alzheimer's and diabetes. EXAM: CT HEAD WITHOUT CONTRAST CT CERVICAL SPINE WITHOUT CONTRAST TECHNIQUE: Multidetector  CT imaging of the head and cervical spine was performed following the standard protocol without intravenous contrast. Multiplanar CT image reconstructions of the cervical spine were also generated. COMPARISON:  06/20/2018 CT head. 05/25/2018 CT cervical spine. FINDINGS: CT HEAD FINDINGS Brain: No evidence of acute infarction, hemorrhage, hydrocephalus, extra-axial collection or mass lesion/mass effect. Stable chronic microvascular ischemic changes and volume loss of the brain. Vascular: No hyperdense vessel or unexpected calcification. Skull: Normal.  Negative for fracture or focal lesion. Sinuses/Orbits: No acute finding. Other: None. CT CERVICAL SPINE FINDINGS Alignment: Straightening of cervical lordosis. Skull base and vertebrae: No acute fracture. No primary bone lesion or focal pathologic process. Soft tissues and spinal canal: No prevertebral fluid or swelling. No visible canal hematoma. Disc levels: Stable cervical spondylosis with predominant discogenic degenerative changes greatest at the C5 through C7 levels. Moderate to severe C5-6 spinal canal stenosis. Uncovertebral hypertrophy encroach on bilateral C5-C7 neural foramen. Upper chest: Negative. Other: Aortic and carotid bifurcation calcific atherosclerosis. IMPRESSION: 1. No acute intracranial abnormality or calvarial fracture. 2. Stable chronic microvascular ischemic changes and volume loss of the brain. 3. No acute fracture or dislocation of cervical spine. 4. Stable cervical spondylosis greatest at the C5 through C7 levels. Moderate to severe C5-6 spinal canal stenosis. Electronically Signed   By: Mitzi HansenLance  Furusawa-Stratton M.D.   On: 01/03/2019 01:55   Ct Cervical Spine Wo Contrast  Result Date: 01/03/2019 CLINICAL DATA:  73 y/o F; fall from wheelchair, laceration to upper lip. History of Alzheimer's and diabetes. EXAM: CT HEAD WITHOUT CONTRAST CT CERVICAL SPINE WITHOUT CONTRAST TECHNIQUE: Multidetector CT imaging of the head and cervical spine was performed following the standard protocol without intravenous contrast. Multiplanar CT image reconstructions of the cervical spine were also generated. COMPARISON:  06/20/2018 CT head. 05/25/2018 CT cervical spine. FINDINGS: CT HEAD FINDINGS Brain: No evidence of acute infarction, hemorrhage, hydrocephalus, extra-axial collection or mass lesion/mass effect. Stable chronic microvascular ischemic changes and volume loss of the brain. Vascular: No hyperdense vessel or unexpected calcification. Skull: Normal. Negative for fracture or focal lesion.  Sinuses/Orbits: No acute finding. Other: None. CT CERVICAL SPINE FINDINGS Alignment: Straightening of cervical lordosis. Skull base and vertebrae: No acute fracture. No primary bone lesion or focal pathologic process. Soft tissues and spinal canal: No prevertebral fluid or swelling. No visible canal hematoma. Disc levels: Stable cervical spondylosis with predominant discogenic degenerative changes greatest at the C5 through C7 levels. Moderate to severe C5-6 spinal canal stenosis. Uncovertebral hypertrophy encroach on bilateral C5-C7 neural foramen. Upper chest: Negative. Other: Aortic and carotid bifurcation calcific atherosclerosis. IMPRESSION: 1. No acute intracranial abnormality or calvarial fracture. 2. Stable chronic microvascular ischemic changes and volume loss of the brain. 3. No acute fracture or dislocation of cervical spine. 4. Stable cervical spondylosis greatest at the C5 through C7 levels. Moderate to severe C5-6 spinal canal stenosis. Electronically Signed   By: Mitzi HansenLance  Furusawa-Stratton M.D.   On: 01/03/2019 01:55   Dg Hips Bilat W Or Wo Pelvis 5 Views  Result Date: 01/03/2019 CLINICAL DATA:  73 year old female status post witnessed fall. EXAM: DG HIP (WITH OR WITHOUT PELVIS) 5+V BILAT COMPARISON:  CT Abdomen and Pelvis 06/20/2018. FINDINGS: Femoral heads are normally located. Hip joint spaces appear stable and symmetric. Osteopenia. No pelvic fracture identified. SI joints appear stable and within normal limits. Negative visible abdominal and pelvic visceral contours, chronic pelvic phleboliths. Proximal femur bone detail is suboptimal due to body habitus. No proximal femur fracture identified. IMPRESSION: No acute fracture or dislocation identified about  the bilateral hips or pelvis. Electronically Signed   By: Odessa FlemingH  Hall M.D.   On: 01/03/2019 00:39    EKG EKG Interpretation  Date/Time:  Tuesday January 03 2019 00:11:49 EDT Ventricular Rate:  45 PR Interval:    QRS Duration: 126 QT  Interval:  524 QTC Calculation: 454 R Axis:   58 Text Interpretation:  Bradycardia with irregular rate Nonspecific intraventricular conduction delay Confirmed by Celes Dedic ([id number]) on 01/03/2019 12:50:34 AM   Radiology Dg Chest 1 View  Result Date: 01/03/2019 CLINICAL DATA:  73 year old female status post witnessed fall. EXAM: CHEST  1 VIEW COMPARISON:  Chest radiographs 06/21/2018 and earlier. FINDINGS: Supine AP view at 0014 hours. Mildly lower lung volumes. Stable cardiomegaly and mediastinal contours. Visualized tracheal air column is within normal limits. Allowing for portable technique the lungs are clear. Paucity of bowel gas in the upper abdomen. No acute osseous abnormality identified. IMPRESSION: No acute cardiopulmonary abnormality or acute traumatic injury identified. Electronically Signed   By: Odessa FlemingH  Hall M.D.   On: 01/03/2019 00:36   Ct Head Wo Contrast  Result Date: 01/03/2019 CLINICAL DATA:  73 y/o F; fall from wheelchair, laceration to upper lip. History of Alzheimer's and diabetes. EXAM: CT HEAD WITHOUT CONTRAST CT CERVICAL SPINE WITHOUT CONTRAST TECHNIQUE: Multidetector CT imaging of the head and cervical spine was performed following the standard protocol without intravenous contrast. Multiplanar CT image reconstructions of the cervical spine were also generated. COMPARISON:  06/20/2018 CT head. 05/25/2018 CT cervical spine. FINDINGS: CT HEAD FINDINGS Brain: No evidence of acute infarction, hemorrhage, hydrocephalus, extra-axial collection or mass lesion/mass effect. Stable chronic microvascular ischemic changes and volume loss of the brain. Vascular: No hyperdense vessel or unexpected calcification. Skull: Normal. Negative for fracture or focal lesion. Sinuses/Orbits: No acute finding. Other: None. CT CERVICAL SPINE FINDINGS Alignment: Straightening of cervical lordosis. Skull base and vertebrae: No acute fracture. No primary bone lesion or focal pathologic process. Soft tissues and  spinal canal: No prevertebral fluid or swelling. No visible canal hematoma. Disc levels: Stable cervical spondylosis with predominant discogenic degenerative changes greatest at the C5 through C7 levels. Moderate to severe C5-6 spinal canal stenosis. Uncovertebral hypertrophy encroach on bilateral C5-C7 neural foramen. Upper chest: Negative. Other: Aortic and carotid bifurcation calcific atherosclerosis. IMPRESSION: 1. No acute intracranial abnormality or calvarial fracture. 2. Stable chronic microvascular ischemic changes and volume loss of the brain. 3. No acute fracture or dislocation of cervical spine. 4. Stable cervical spondylosis greatest at the C5 through C7 levels. Moderate to severe C5-6 spinal canal stenosis. Electronically Signed   By: Mitzi HansenLance  Furusawa-Stratton M.D.   On: 01/03/2019 01:55   Ct Cervical Spine Wo Contrast  Result Date: 01/03/2019 CLINICAL DATA:  73 y/o F; fall from wheelchair, laceration to upper lip. History of Alzheimer's and diabetes. EXAM: CT HEAD WITHOUT CONTRAST CT CERVICAL SPINE WITHOUT CONTRAST TECHNIQUE: Multidetector CT imaging of the head and cervical spine was performed following the standard protocol without intravenous contrast. Multiplanar CT image reconstructions of the cervical spine were also generated. COMPARISON:  06/20/2018 CT head. 05/25/2018 CT cervical spine. FINDINGS: CT HEAD FINDINGS Brain: No evidence of acute infarction, hemorrhage, hydrocephalus, extra-axial collection or mass lesion/mass effect. Stable chronic microvascular ischemic changes and volume loss of the brain. Vascular: No hyperdense vessel or unexpected calcification. Skull: Normal. Negative for fracture or focal lesion. Sinuses/Orbits: No acute finding. Other: None. CT CERVICAL SPINE FINDINGS Alignment: Straightening of cervical lordosis. Skull base and vertebrae: No acute fracture. No primary bone lesion or focal  pathologic process. Soft tissues and spinal canal: No prevertebral fluid or  swelling. No visible canal hematoma. Disc levels: Stable cervical spondylosis with predominant discogenic degenerative changes greatest at the C5 through C7 levels. Moderate to severe C5-6 spinal canal stenosis. Uncovertebral hypertrophy encroach on bilateral C5-C7 neural foramen. Upper chest: Negative. Other: Aortic and carotid bifurcation calcific atherosclerosis. IMPRESSION: 1. No acute intracranial abnormality or calvarial fracture. 2. Stable chronic microvascular ischemic changes and volume loss of the brain. 3. No acute fracture or dislocation of cervical spine. 4. Stable cervical spondylosis greatest at the C5 through C7 levels. Moderate to severe C5-6 spinal canal stenosis. Electronically Signed   By: Kristine Garbe M.D.   On: 01/03/2019 01:55   Dg Hips Bilat W Or Wo Pelvis 5 Views  Result Date: 01/03/2019 CLINICAL DATA:  73 year old female status post witnessed fall. EXAM: DG HIP (WITH OR WITHOUT PELVIS) 5+V BILAT COMPARISON:  CT Abdomen and Pelvis 06/20/2018. FINDINGS: Femoral heads are normally located. Hip joint spaces appear stable and symmetric. Osteopenia. No pelvic fracture identified. SI joints appear stable and within normal limits. Negative visible abdominal and pelvic visceral contours, chronic pelvic phleboliths. Proximal femur bone detail is suboptimal due to body habitus. No proximal femur fracture identified. IMPRESSION: No acute fracture or dislocation identified about the bilateral hips or pelvis. Electronically Signed   By: Genevie Ann M.D.   On: 01/03/2019 00:39    Procedures Procedures (including critical care time)  Medications Ordered in ED Medications  vancomycin (VANCOCIN) 2,000 mg in sodium chloride 0.9 % 500 mL IVPB (2,000 mg Intravenous New Bag/Given 01/03/19 0138)  sodium chloride 0.9 % bolus 30 mL/kg (has no administration in time range)  naloxone [facility]) injection 1 mg (1 mg Intramuscular Given 01/02/19 2338)  ceFEPIme (MAXIPIME) 2 g in sodium chloride 0.9 %  100 mL IVPB (0 g Intravenous Stopped 01/03/19 0137)  metroNIDAZOLE (FLAGYL) IVPB 500 mg (0 mg Intravenous Stopped 01/03/19 0213)    MDM Reviewed: previous chart, nursing note and vitals Interpretation: labs, x-ray and CT scan (low depakote level, negative cxr NEGATIVE COIVD  ) Total time providing critical care: 75-105 minutes. This excludes time spent performing separately reportable procedures and services. Consults: admitting MD  CRITICAL CARE Performed by: Hazelene Doten K Amilia Vandenbrink-Rasch Total critical care time: 75 minutes Critical care time was exclusive of separately billable procedures and treating other patients. Critical care was necessary to treat or prevent imminent or life-threatening deterioration. Critical care was time spent personally by me on the following activities: development of treatment plan with patient and/or surrogate as well as nursing, discussions with consultants, evaluation of patient's response to treatment, examination of patient, obtaining history from patient or surrogate, ordering and performing treatments and interventions, ordering and review of laboratory studies, ordering and review of radiographic studies, pulse oximetry and re-evaluation of patient's condition.  Final Clinical Impressions(s) / ED Diagnoses   Final diagnoses:  Fall, initial encounter  Hypothermia, initial encounter   Admit to medicine for hypothermia.     Alford Gamero, MD 01/03/19 1191    Veatrice Kells, MD 01/03/19 Jonne Ply, Ausar Georgiou, MD 01/03/19 4782

## 2019-01-03 NOTE — ED Notes (Signed)
ED TO INPATIENT HANDOFF REPORT  Name/Age/Gender Penny Carr 73 y.o. female  Code Status    Code Status Orders  (From admission, onward)         Start     Ordered   01/03/19 0245  Full code  Continuous     01/03/19 0247        Code Status History    Date Active Date Inactive Code Status Order ID Comments User Context   06/20/2018 1841 06/25/2018 1948 Full Code [id number]  Penny Igo, DO ED   Advance Care Planning Activity      Home/SNF/Other Nursing Home  Chief Complaint Fall  Level of Care/Admitting Diagnosis ED Disposition    ED Disposition Condition Comment   Admit  Hospital Area: [facility] [[id number]]  Level of Care: Stepdown [14]  Admit to SDU based on following criteria: Cardiac Instability:  Patients experiencing chest pain, unconfirmed MI and stable, arrhythmias and CHF requiring medical management and potentially compromising patient's stability  Covid Evaluation: Confirmed COVID Negative  Diagnosis: Hypothermia [409811]  Admitting Physician: Pearson Grippe [[id number]]  Attending Physician: Pearson Grippe [[id number]]  PT Class (Do Not Modify): Observation [104]  PT Acc Code (Do Not Modify): Observation [10022]       Medical History Past Medical History:  Diagnosis Date  . Alzheimer disease (HCC)   . Diabetes mellitus without complication (HCC)     Allergies No Known Allergies  IV Location/Drains/Wounds Patient Lines/Drains/Airways Status   Active Line/Drains/Airways    Name:   Placement date:   Placement time:   Site:   Days:   Peripheral IV 06/22/18 Right;Anterior Forearm   06/22/18    1235    Forearm   195   Peripheral IV 01/03/19 Left Hand   01/03/19    0037    Hand   less than 1   Peripheral IV 01/03/19 Right Forearm   01/03/19    0031    Forearm   less than 1   External Urinary Catheter   06/21/18    1410    -   196   Wound / Incision (Open or Dehisced) 05/25/18 Laceration Face Left;Upper head lac above L eyebrow.     05/25/18     1345    Face   223          Labs/Imaging Results for orders placed or performed during the hospital encounter of 01/02/19 (from the past 48 hour(s))  POC CBG, ED     Status: Abnormal   Collection Time: 01/02/19 11:39 PM  Result Value Ref Range   Glucose-Capillary 112 (H) 70 - 99 mg/dL  Lactic acid, plasma     Status: None   Collection Time: 01/03/19 12:05 AM  Result Value Ref Range   Lactic Acid, Venous 1.1 0.5 - 1.9 mmol/L    Comment: Performed at [facility], [address] [place], [place] [address]  Comprehensive metabolic panel     Status: Abnormal   Collection Time: 01/03/19 12:05 AM  Result Value Ref Range   Sodium 140 135 - 145 mmol/L   Potassium 4.3 3.5 - 5.1 mmol/L   Chloride 102 98 - 111 mmol/L   CO2 28 22 - 32 mmol/L   Glucose, Bld 127 (H) 70 - 99 mg/dL   BUN 20 8 - 23 mg/dL   Creatinine, Ser 2.95 0.44 - 1.00 mg/dL   Calcium 62.1 (H) 8.9 - 10.3 mg/dL   Total Protein 6.9 6.5 - 8.1  g/dL   Albumin 3.6 3.5 - 5.0 g/dL   AST 20 15 - 41 U/L   ALT 21 0 - 44 U/L   Alkaline Phosphatase 76 38 - 126 U/L   Total Bilirubin 0.6 0.3 - 1.2 mg/dL   GFR calc non Af Amer >60 >60 mL/min   GFR calc Af Amer >60 >60 mL/min   Anion gap 10 5 - 15    Comment: Performed at [facility], [address] [place], [place] [address]  CBC WITH DIFFERENTIAL     Status: Abnormal   Collection Time: 01/03/19 12:05 AM  Result Value Ref Range   WBC 6.4 4.0 - 10.5 K/uL   RBC 4.65 3.87 - 5.11 MIL/uL   Hemoglobin 13.5 12.0 - 15.0 g/dL   HCT 60.445.2 54.036.0 - 98.146.0 %   MCV 97.2 80.0 - 100.0 fL   MCH 29.0 26.0 - 34.0 pg   MCHC 29.9 (L) 30.0 - 36.0 g/dL   RDW 19.117.2 (H) 47.811.5 - 29.515.5 %   Platelets 172 150 - 400 K/uL   nRBC 0.0 0.0 - 0.2 %   Neutrophils Relative % 61 %   Neutro Abs 4.0 1.7 - 7.7 K/uL   Lymphocytes Relative 27 %   Lymphs Abs 1.7 0.7 - 4.0 K/uL   Monocytes Relative 9 %   Monocytes Absolute 0.6 0.1 - 1.0 K/uL   Eosinophils Relative 1 %    Eosinophils Absolute 0.1 0.0 - 0.5 K/uL   Basophils Relative 1 %   Basophils Absolute 0.0 0.0 - 0.1 K/uL   Immature Granulocytes 1 %   Abs Immature Granulocytes 0.03 0.00 - 0.07 K/uL    Comment: Performed at [facility], [address] [place], [place] [address]  APTT     Status: None   Collection Time: 01/03/19 12:05 AM  Result Value Ref Range   aPTT 29 24 - 36 seconds    Comment: Performed at [facility], [address] [place], [place] [address]  Protime-INR     Status: None   Collection Time: 01/03/19 12:05 AM  Result Value Ref Range   Prothrombin Time 12.1 11.4 - 15.2 seconds   INR 0.9 0.8 - 1.2    Comment: (NOTE) INR goal varies based on device and disease states. Performed at [facility], [address] [place], [place] [address]   Urinalysis, Routine w reflex microscopic     Status: Abnormal   Collection Time: 01/03/19 12:05 AM  Result Value Ref Range   Color, Urine YELLOW YELLOW   APPearance CLEAR CLEAR   Specific Gravity, Urine 1.023 1.005 - 1.030   pH 5.0 5.0 - 8.0   Glucose, UA NEGATIVE NEGATIVE mg/dL   Hgb urine dipstick NEGATIVE NEGATIVE   Bilirubin Urine NEGATIVE NEGATIVE   Ketones, ur NEGATIVE NEGATIVE mg/dL   Protein, ur NEGATIVE NEGATIVE mg/dL   Nitrite NEGATIVE NEGATIVE   Leukocytes,Ua TRACE (A) NEGATIVE   RBC / HPF 0-5 0 - 5 RBC/hpf   WBC, UA 11-20 0 - 5 WBC/hpf   Bacteria, UA RARE (A) NONE SEEN   Mucus PRESENT     Comment: Performed at [facility], [address] [place], [place] [address]  SARS Coronavirus 2 (CEPHEID - Performed in [facility] hospital lab), Hosp Order     Status: None   Collection Time: 01/03/19 12:06 AM   Specimen: Nasopharyngeal Swab  Result Value Ref Range   SARS Coronavirus 2 NEGATIVE NEGATIVE  Comment: (NOTE) If result is NEGATIVE SARS-CoV-2 target nucleic acids are NOT DETECTED. The SARS-CoV-2 RNA is generally detectable in upper and lower   respiratory specimens during the acute phase of infection. The lowest  concentration of SARS-CoV-2 viral copies this assay can detect is 250  copies / mL. A negative result does not preclude SARS-CoV-2 infection  and should not be used as the sole basis for treatment or other  patient management decisions.  A negative result may occur with  improper specimen collection / handling, submission of specimen other  than nasopharyngeal swab, presence of viral mutation(s) within the  areas targeted by this assay, and inadequate number of viral copies  (<250 copies / mL). A negative result must be combined with clinical  observations, patient history, and epidemiological information. If result is POSITIVE SARS-CoV-2 target nucleic acids are DETECTED. The SARS-CoV-2 RNA is generally detectable in upper and lower  respiratory specimens dur ing the acute phase of infection.  Positive  results are indicative of active infection with SARS-CoV-2.  Clinical  correlation with patient history and other diagnostic information is  necessary to determine patient infection status.  Positive results do  not rule out bacterial infection or co-infection with other viruses. If result is PRESUMPTIVE POSTIVE SARS-CoV-2 nucleic acids MAY BE PRESENT.   A presumptive positive result was obtained on the submitted specimen  and confirmed on repeat testing.  While 2019 novel coronavirus  (SARS-CoV-2) nucleic acids may be present in the submitted sample  additional confirmatory testing may be necessary for epidemiological  and / or clinical management purposes  to differentiate between  SARS-CoV-2 and other Sarbecovirus currently known to infect humans.  If clinically indicated additional testing with an alternate test  methodology [id number]) is advised. The SARS-CoV-2 RNA is generally  detectable in upper and lower respiratory sp ecimens during the acute  phase of infection. The expected result is Negative. Fact  Sheet for Patients:  BoilerBrush.com.cyhttps://www.fda.gov/media/136312/download Fact Sheet for Healthcare Providers: https://pope.com/https://www.fda.gov/media/136313/download This test is not yet approved or cleared by the [place] FDA and has been authorized for detection and/or diagnosis of SARS-CoV-2 by FDA under an Emergency Use Authorization (EUA).  This EUA will remain in effect (meaning this test can be used) for the duration of the COVID-19 declaration under Section 564(b)(1) of the Act, 21 U.S.C. section 360bbb-3(b)(1), unless the authorization is terminated or revoked sooner. Performed at [facility], [address] [place], [place] [address]   Valproic acid level     Status: Abnormal   Collection Time: 01/03/19  1:10 AM  Result Value Ref Range   Valproic Acid Lvl 44 (L) 50.0 - 100.0 ug/mL    Comment: Performed at [facility], [address] [place], [place] [address]   Dg Chest 1 View  Result Date: 01/03/2019 CLINICAL DATA:  73 year old female status post witnessed fall. EXAM: CHEST  1 VIEW COMPARISON:  Chest radiographs 06/21/2018 and earlier. FINDINGS: Supine AP view at 0014 hours. Mildly lower lung volumes. Stable cardiomegaly and mediastinal contours. Visualized tracheal air column is within normal limits. Allowing for portable technique the lungs are clear. Paucity of bowel gas in the upper abdomen. No acute osseous abnormality identified. IMPRESSION: No acute cardiopulmonary abnormality or acute traumatic injury identified. Electronically Signed   By: Odessa FlemingH  Hall M.D.   On: 01/03/2019 00:36   Ct Head Wo Contrast  Result Date: 01/03/2019 CLINICAL DATA:  73 y/o F; fall from wheelchair, laceration to upper lip. History of Alzheimer's  and diabetes. EXAM: CT HEAD WITHOUT CONTRAST CT CERVICAL SPINE WITHOUT CONTRAST TECHNIQUE: Multidetector CT imaging of the head and cervical spine was performed following the standard protocol without intravenous contrast. Multiplanar CT  image reconstructions of the cervical spine were also generated. COMPARISON:  06/20/2018 CT head. 05/25/2018 CT cervical spine. FINDINGS: CT HEAD FINDINGS Brain: No evidence of acute infarction, hemorrhage, hydrocephalus, extra-axial collection or mass lesion/mass effect. Stable chronic microvascular ischemic changes and volume loss of the brain. Vascular: No hyperdense vessel or unexpected calcification. Skull: Normal. Negative for fracture or focal lesion. Sinuses/Orbits: No acute finding. Other: None. CT CERVICAL SPINE FINDINGS Alignment: Straightening of cervical lordosis. Skull base and vertebrae: No acute fracture. No primary bone lesion or focal pathologic process. Soft tissues and spinal canal: No prevertebral fluid or swelling. No visible canal hematoma. Disc levels: Stable cervical spondylosis with predominant discogenic degenerative changes greatest at the C5 through C7 levels. Moderate to severe C5-6 spinal canal stenosis. Uncovertebral hypertrophy encroach on bilateral C5-C7 neural foramen. Upper chest: Negative. Other: Aortic and carotid bifurcation calcific atherosclerosis. IMPRESSION: 1. No acute intracranial abnormality or calvarial fracture. 2. Stable chronic microvascular ischemic changes and volume loss of the brain. 3. No acute fracture or dislocation of cervical spine. 4. Stable cervical spondylosis greatest at the C5 through C7 levels. Moderate to severe C5-6 spinal canal stenosis. Electronically Signed   By: Kristine Garbe M.D.   On: 01/03/2019 01:55   Ct Cervical Spine Wo Contrast  Result Date: 01/03/2019 CLINICAL DATA:  73 y/o F; fall from wheelchair, laceration to upper lip. History of Alzheimer's and diabetes. EXAM: CT HEAD WITHOUT CONTRAST CT CERVICAL SPINE WITHOUT CONTRAST TECHNIQUE: Multidetector CT imaging of the head and cervical spine was performed following the standard protocol without intravenous contrast. Multiplanar CT image reconstructions of the cervical spine  were also generated. COMPARISON:  06/20/2018 CT head. 05/25/2018 CT cervical spine. FINDINGS: CT HEAD FINDINGS Brain: No evidence of acute infarction, hemorrhage, hydrocephalus, extra-axial collection or mass lesion/mass effect. Stable chronic microvascular ischemic changes and volume loss of the brain. Vascular: No hyperdense vessel or unexpected calcification. Skull: Normal. Negative for fracture or focal lesion. Sinuses/Orbits: No acute finding. Other: None. CT CERVICAL SPINE FINDINGS Alignment: Straightening of cervical lordosis. Skull base and vertebrae: No acute fracture. No primary bone lesion or focal pathologic process. Soft tissues and spinal canal: No prevertebral fluid or swelling. No visible canal hematoma. Disc levels: Stable cervical spondylosis with predominant discogenic degenerative changes greatest at the C5 through C7 levels. Moderate to severe C5-6 spinal canal stenosis. Uncovertebral hypertrophy encroach on bilateral C5-C7 neural foramen. Upper chest: Negative. Other: Aortic and carotid bifurcation calcific atherosclerosis. IMPRESSION: 1. No acute intracranial abnormality or calvarial fracture. 2. Stable chronic microvascular ischemic changes and volume loss of the brain. 3. No acute fracture or dislocation of cervical spine. 4. Stable cervical spondylosis greatest at the C5 through C7 levels. Moderate to severe C5-6 spinal canal stenosis. Electronically Signed   By: Kristine Garbe M.D.   On: 01/03/2019 01:55   Dg Hips Bilat W Or Wo Pelvis 5 Views  Result Date: 01/03/2019 CLINICAL DATA:  73 year old female status post witnessed fall. EXAM: DG HIP (WITH OR WITHOUT PELVIS) 5+V BILAT COMPARISON:  CT Abdomen and Pelvis 06/20/2018. FINDINGS: Femoral heads are normally located. Hip joint spaces appear stable and symmetric. Osteopenia. No pelvic fracture identified. SI joints appear stable and within normal limits. Negative visible abdominal and pelvic visceral contours, chronic pelvic  phleboliths. Proximal femur bone detail is suboptimal due to  body habitus. No proximal femur fracture identified. IMPRESSION: No acute fracture or dislocation identified about the bilateral hips or pelvis. Electronically Signed   By: Odessa FlemingH  Hall M.D.   On: 01/03/2019 00:39    Pending Labs Unresulted Labs (From admission, onward)    Start     Ordered   01/10/19 0500  Creatinine, serum  (enoxaparin (LOVENOX)    CrCl >/= 30 ml/min)  Weekly,   R    Comments: while on enoxaparin therapy    01/03/19 0247   01/03/19 0500  Comprehensive metabolic panel  Tomorrow morning,   R     01/03/19 0247   01/03/19 0500  CBC  Tomorrow morning,   R     01/03/19 0247   01/03/19 0201  TSH  ONCE - STAT,   STAT     01/03/19 0200   01/03/19 0005  Lactic acid, plasma  Now then every 2 hours,   STAT     01/03/19 0005   01/03/19 0005  Blood Culture (routine x 2)  BLOOD CULTURE X 2,   STAT     01/03/19 0005   01/03/19 0005  Urine culture  ONCE - STAT,   STAT     01/03/19 0005          Vitals/Pain Today's Vitals   01/03/19 0137 01/03/19 0230 01/03/19 0243 01/03/19 0244  BP: (!) 147/78  (!) 110/57   Pulse: (!) 54  (!) 51   Resp: 11  15   Temp:  (!) 92.6 F (33.7 C)    TempSrc:  Rectal    SpO2: 93%  98%   Weight:    108.4 kg  Height:    5\' 6"  (1.676 m)    Isolation Precautions No active isolations  Medications Medications  vancomycin (VANCOCIN) 2,000 mg in sodium chloride 0.9 % 500 mL IVPB (2,000 mg Intravenous New Bag/Given 01/03/19 0138)  sodium chloride 0.9 % bolus 30 mL/kg (has no administration in time range)  enoxaparin (LOVENOX) injection 40 mg (has no administration in time range)  0.9 %  sodium chloride infusion (has no administration in time range)  acetaminophen (TYLENOL) tablet 650 mg (has no administration in time range)    Or  acetaminophen (TYLENOL) suppository 650 mg (has no administration in time range)  cefTRIAXone (ROCEPHIN) 1 g in sodium chloride 0.9 % 100 mL IVPB (has no  administration in time range)  naloxone [facility](NARCAN) injection 1 mg (1 mg Intramuscular Given 01/02/19 2338)  ceFEPIme (MAXIPIME) 2 g in sodium chloride 0.9 % 100 mL IVPB (0 g Intravenous Stopped 01/03/19 0137)  metroNIDAZOLE (FLAGYL) IVPB 500 mg (0 mg Intravenous Stopped 01/03/19 0213)    Mobility non-ambulatory

## 2019-01-03 NOTE — Progress Notes (Signed)
ASSUMPTION OF CARE NOTE   01/03/2019 3:43 PM  Penny Carr was seen and examined.  The H&P by the admitting provider, orders, imaging was reviewed.  Please see new orders.  Pt briefly was placed on fluid warmer and warming blankets until temperature improved.  Will continue to follow.   Vitals:   01/03/19 1202 01/03/19 1236  BP:    Pulse:    Resp:    Temp:    SpO2: 94% 96%    Results for orders placed or performed during the hospital encounter of 01/02/19  Blood Culture (routine x 2)   Specimen: BLOOD LEFT HAND  Result Value Ref Range   Specimen Description      BLOOD LEFT HAND Performed at [facility], [address] [place], [place] [address]    Special Requests      BOTTLES DRAWN AEROBIC AND ANAEROBIC Blood Culture adequate volume Performed at [facility], [address] [place], [place] [address]    Culture PENDING    Report Status PENDING   Blood Culture (routine x 2)   Specimen: BLOOD RIGHT FOREARM  Result Value Ref Range   Specimen Description      BLOOD RIGHT FOREARM Performed at [facility], [address] [place], [place] [address]    Special Requests      BOTTLES DRAWN AEROBIC AND ANAEROBIC Blood Culture adequate volume Performed at [facility], [address] [place], [place] [address]    Culture PENDING    Report Status PENDING   SARS Coronavirus 2 (CEPHEID - Performed in [facility] hospital lab), [facility]   Specimen: Nasopharyngeal Swab  Result Value Ref Range   SARS Coronavirus 2 NEGATIVE NEGATIVE  MRSA PCR Screening   Specimen: Nasopharyngeal  Result Value Ref Range   MRSA by PCR NEGATIVE NEGATIVE  Lactic acid, plasma  Result Value Ref Range   Lactic Acid, Venous 1.1 0.5 - 1.9 mmol/L  Lactic acid, plasma  Result Value Ref Range   Lactic Acid, Venous 0.9 0.5 - 1.9 mmol/L  Comprehensive metabolic panel  Result Value Ref Range   Sodium 140 135 - 145 mmol/L   Potassium 4.3 3.5 -  5.1 mmol/L   Chloride 102 98 - 111 mmol/L   CO2 28 22 - 32 mmol/L   Glucose, Bld 127 (H) 70 - 99 mg/dL   BUN 20 8 - 23 mg/dL   Creatinine, Ser 2.950.62 0.44 - 1.00 mg/dL   Calcium 62.110.7 (H) 8.9 - 10.3 mg/dL   Total Protein 6.9 6.5 - 8.1 g/dL   Albumin 3.6 3.5 - 5.0 g/dL   AST 20 15 - 41 U/L   ALT 21 0 - 44 U/L   Alkaline Phosphatase 76 38 - 126 U/L   Total Bilirubin 0.6 0.3 - 1.2 mg/dL   GFR calc non Af Amer >60 >60 mL/min   GFR calc Af Amer >60 >60 mL/min   Anion gap 10 5 - 15  CBC WITH DIFFERENTIAL  Result Value Ref Range   WBC 6.4 4.0 - 10.5 K/uL   RBC 4.65 3.87 - 5.11 MIL/uL   Hemoglobin 13.5 12.0 - 15.0 g/dL   HCT 30.845.2 65.736.0 - 84.646.0 %   MCV 97.2 80.0 - 100.0 fL   MCH 29.0 26.0 - 34.0 pg   MCHC 29.9 (L) 30.0 - 36.0 g/dL   RDW 96.217.2 (H) 95.211.5 - 84.115.5 %   Platelets 172 150 - 400 K/uL   nRBC 0.0  0.0 - 0.2 %   Neutrophils Relative % 61 %   Neutro Abs 4.0 1.7 - 7.7 K/uL   Lymphocytes Relative 27 %   Lymphs Abs 1.7 0.7 - 4.0 K/uL   Monocytes Relative 9 %   Monocytes Absolute 0.6 0.1 - 1.0 K/uL   Eosinophils Relative 1 %   Eosinophils Absolute 0.1 0.0 - 0.5 K/uL   Basophils Relative 1 %   Basophils Absolute 0.0 0.0 - 0.1 K/uL   Immature Granulocytes 1 %   Abs Immature Granulocytes 0.03 0.00 - 0.07 K/uL  APTT  Result Value Ref Range   aPTT 29 24 - 36 seconds  Protime-INR  Result Value Ref Range   Prothrombin Time 12.1 11.4 - 15.2 seconds   INR 0.9 0.8 - 1.2  Urinalysis, Routine w reflex microscopic  Result Value Ref Range   Color, Urine YELLOW YELLOW   APPearance CLEAR CLEAR   Specific Gravity, Urine 1.023 1.005 - 1.030   pH 5.0 5.0 - 8.0   Glucose, UA NEGATIVE NEGATIVE mg/dL   Hgb urine dipstick NEGATIVE NEGATIVE   Bilirubin Urine NEGATIVE NEGATIVE   Ketones, ur NEGATIVE NEGATIVE mg/dL   Protein, ur NEGATIVE NEGATIVE mg/dL   Nitrite NEGATIVE NEGATIVE   Leukocytes,Ua TRACE (A) NEGATIVE   RBC / HPF 0-5 0 - 5 RBC/hpf   WBC, UA 11-20 0 - 5 WBC/hpf   Bacteria, UA RARE (A)  NONE SEEN   Mucus PRESENT   Valproic acid level  Result Value Ref Range   Valproic Acid Lvl 44 (L) 50.0 - 100.0 ug/mL  TSH  Result Value Ref Range   TSH 3.341 0.350 - 4.500 uIU/mL  Comprehensive metabolic panel  Result Value Ref Range   Sodium 142 135 - 145 mmol/L   Potassium 4.4 3.5 - 5.1 mmol/L   Chloride 106 98 - 111 mmol/L   CO2 27 22 - 32 mmol/L   Glucose, Bld 121 (H) 70 - 99 mg/dL   BUN 17 8 - 23 mg/dL   Creatinine, Ser 0.61 0.44 - 1.00 mg/dL   Calcium 9.6 8.9 - 10.3 mg/dL   Total Protein 5.3 (L) 6.5 - 8.1 g/dL   Albumin 2.7 (L) 3.5 - 5.0 g/dL   AST 23 15 - 41 U/L   ALT 17 0 - 44 U/L   Alkaline Phosphatase 60 38 - 126 U/L   Total Bilirubin 1.0 0.3 - 1.2 mg/dL   GFR calc non Af Amer >60 >60 mL/min   GFR calc Af Amer >60 >60 mL/min   Anion gap 9 5 - 15  CBC  Result Value Ref Range   WBC 6.0 4.0 - 10.5 K/uL   RBC 3.92 3.87 - 5.11 MIL/uL   Hemoglobin 11.1 (L) 12.0 - 15.0 g/dL   HCT 37.5 36.0 - 46.0 %   MCV 95.7 80.0 - 100.0 fL   MCH 28.3 26.0 - 34.0 pg   MCHC 29.6 (L) 30.0 - 36.0 g/dL   RDW 17.1 (H) 11.5 - 15.5 %   Platelets 136 (L) 150 - 400 K/uL   nRBC 0.0 0.0 - 0.2 %  CK total and CKMB (cardiac)not at [facility]  Result Value Ref Range   Total CK 148 38 - 234 U/L   CK, MB 10.9 (H) 0.5 - 5.0 ng/mL   Relative Index 7.4 (H) 0.0 - 2.5  POC CBG, ED  Result Value Ref Range   Glucose-Capillary 112 (H) 70 - 99 mg/dL  Troponin I (High Sensitivity)  Result Value Ref  Range   Troponin I (High Sensitivity) 10.0 <18 ng/L     C. Laural BenesJohnson, MD [facility]   01/02/2019 11:16 PM How to contact the [facility] Attending or Consulting provider 7A - 7P or covering provider during after hours 7P -7A, for this patient?  1. Check the care team in [facility] and look for a) attending/consulting TR[facility] provider listed and b) the [facility] team listed 2. Log into [email or web address] and use [place]'s universal password to access. If you do not have the password, please contact the hospital  operator. 3. Locate the [facility] provider you are looking for under [facility] and page to a number that you can be directly reached. 4. If you still have difficulty reaching the provider, please page the [facility] (Director on Call) for the Hospitalists listed on amion for assistance.

## 2019-01-03 NOTE — ED Notes (Signed)
XR at bedside

## 2019-01-03 NOTE — Progress Notes (Signed)
A consult was received from an ED physician for vancomycin, cefepime per pharmacy dosing.  The patient's profile has been reviewed for ht/wt/allergies/indication/available labs.   A one time order has been placed for Vancomycin 2gm iv x1, and Cefepime 2gm iv x1.  Further antibiotics/pharmacy consults should be ordered by admitting physician if indicated.                       Thank you, Nani Skillern Crowford 01/03/2019  12:21 AM

## 2019-01-03 NOTE — ED Notes (Signed)
Md made aware of BP.

## 2019-01-03 NOTE — Progress Notes (Signed)
Discontinued both the blanket warmer and fluid warmer due to pt's temperature returning to above normal, 100.7 rectal.  Returned room temperature to normal and opened the door to help cool the room.

## 2019-01-03 NOTE — ED Notes (Signed)
Urine and culture sent to lab  

## 2019-01-04 DIAGNOSIS — W19XXXA Unspecified fall, initial encounter: Secondary | ICD-10-CM

## 2019-01-04 LAB — COMPREHENSIVE METABOLIC PANEL
ALT: 16 U/L (ref 0–44)
AST: 17 U/L (ref 15–41)
Albumin: 2.6 g/dL — ABNORMAL LOW (ref 3.5–5.0)
Alkaline Phosphatase: 57 U/L (ref 38–126)
Anion gap: 7 (ref 5–15)
BUN: 10 mg/dL (ref 8–23)
CO2: 26 mmol/L (ref 22–32)
Calcium: 9.4 mg/dL (ref 8.9–10.3)
Chloride: 109 mmol/L (ref 98–111)
Creatinine, Ser: 0.5 mg/dL (ref 0.44–1.00)
GFR calc Af Amer: >60 mL/min (ref >60)
GFR calc non Af Amer: >60 mL/min (ref >60)
Glucose, Bld: 96 mg/dL (ref 70–99)
Potassium: 3.5 mmol/L (ref 3.5–5.1)
Sodium: 142 mmol/L (ref 135–145)
Total Bilirubin: 0.6 mg/dL (ref 0.3–1.2)
Total Protein: 5.4 g/dL — ABNORMAL LOW (ref 6.5–8.1)

## 2019-01-04 LAB — CBC WITH DIFFERENTIAL/PLATELET
Abs Immature Granulocytes: 0.02 10*3/uL (ref 0.00–0.07)
Basophils Absolute: 0 10*3/uL (ref 0.0–0.1)
Basophils Relative: 0 %
Eosinophils Absolute: 0.1 10*3/uL (ref 0.0–0.5)
Eosinophils Relative: 1 %
HCT: 38.4 % (ref 36.0–46.0)
Hemoglobin: 11.3 g/dL — ABNORMAL LOW (ref 12.0–15.0)
Immature Granulocytes: 0 %
Lymphocytes Relative: 33 %
Lymphs Abs: 1.9 10*3/uL (ref 0.7–4.0)
MCH: 29 pg (ref 26.0–34.0)
MCHC: 29.4 g/dL — ABNORMAL LOW (ref 30.0–36.0)
MCV: 98.7 fL (ref 80.0–100.0)
Monocytes Absolute: 0.7 10*3/uL (ref 0.1–1.0)
Monocytes Relative: 12 %
Neutro Abs: 2.9 10*3/uL (ref 1.7–7.7)
Neutrophils Relative %: 54 %
Platelets: 151 10*3/uL (ref 150–400)
RBC: 3.89 MIL/uL (ref 3.87–5.11)
RDW: 17.5 % — ABNORMAL HIGH (ref 11.5–15.5)
WBC: 5.6 10*3/uL (ref 4.0–10.5)
nRBC: 0 % (ref 0.0–0.2)

## 2019-01-04 LAB — URINE CULTURE: Culture: NO GROWTH

## 2019-01-04 LAB — GLUCOSE, CAPILLARY
Glucose-Capillary: 77 mg/dL (ref 70–99)
Glucose-Capillary: 89 mg/dL (ref 70–99)

## 2019-01-04 LAB — HEMOGLOBIN A1C
Hgb A1c MFr Bld: 5.8 % — ABNORMAL HIGH (ref 4.8–5.6)
Mean Plasma Glucose: 119.76 mg/dL

## 2019-01-04 LAB — MAGNESIUM: Magnesium: 1.9 mg/dL (ref 1.7–2.4)

## 2019-01-04 MED ORDER — INSULIN ASPART 100 UNIT/ML ~~LOC~~ SOLN: 0.0000 [IU] | Freq: Every day | SUBCUTANEOUS | Status: DC

## 2019-01-04 MED ORDER — ENSURE ENLIVE PO LIQD
237.0000 mL | Freq: Two times a day (BID) | ORAL | Status: DC
  Administered 2019-01-05 – 2019-01-10 (×8): 237 mL via ORAL

## 2019-01-04 MED ORDER — INSULIN ASPART 100 UNIT/ML ~~LOC~~ SOLN
0.0000 [IU] | Freq: Three times a day (TID) | SUBCUTANEOUS | Status: DC
  Administered 2019-01-06 – 2019-01-07 (×2): 3 [IU] via SUBCUTANEOUS
  Administered 2019-01-08 – 2019-01-12 (×5): 2 [IU] via SUBCUTANEOUS

## 2019-01-04 NOTE — Progress Notes (Signed)
PROGRESS NOTE    Penny Carr  YQM:[id number] DOB: 11/14/1945 DOA: 01/02/2019 PCP: Samuel JesterButler, Cynthia, DO (Inactive)    Brief Narrative:  73 y.o. female, w dementia, apparently has witnessed fall out of wheel chair, laceration on the upper lip and presents to ER for evaluation. In the ED, pt was found to be hypothermic and bradycardic  Assessment & Plan:   Principal Problem:   Hypothermia Active Problems:   Acute lower UTI   Bradycardia   Diabetes (HCC)   Dementia (HCC)   Hypothermia ? Secondary to early sepsis No osborne j wave on ekg was noted Blood culture x2 pending, neg thus far Had stopped Depakote at time of presentation as it can cause hypothermia Had also stopped Haldol since it too can cause hypothermia Resolved  Acute lower uti Blood culture x2 pending, neg thus far Urine culture pending, thus far no growth Continued on empiric Rocephin 1gm iv qday  Bradycardia (chronic), asymptomatic Continued on Tele Trop neg thus far NOTE Namenda, Lexapro, Depakote can cause bradycardia  Dm2 - Will order SSI coverage -stable at present  Hyperlipidemia -Cont Simvastatin 40mg  po qhs as tolerated  Dementia Cont Namenda XR 28mg  po qday Cont Valium 5mg  po bid => decreased to 2.5mg  po bid  Cont Lexapro 10mg  po qday  DVT prophylaxis: Lovenox subQ Code Status: Full Family Communication: Pt in room, family not at bedside Disposition Plan: Uncertain at this time  Consultants:     Procedures:     Antimicrobials: Anti-infectives (From admission, onward)   Start     Dose/Rate Route Frequency Ordered Stop   01/03/19 1000  cefTRIAXone (ROCEPHIN) 1 g in sodium chloride 0.9 % 100 mL IVPB  Status:  Discontinued     1 g 200 mL/hr over 30 Minutes Intravenous Every 24 hours 01/03/19 0247 01/03/19 0713   01/03/19 1000  cefTRIAXone (ROCEPHIN) 2 g in sodium chloride 0.9 % 100 mL IVPB     2 g 200 mL/hr over 30 Minutes Intravenous Every 24 hours 01/03/19 0713     01/03/19 0030  vancomycin (VANCOCIN) 2,000 mg in sodium chloride 0.9 % 500 mL IVPB     2,000 mg 250 mL/hr over 120 Minutes Intravenous  Once 01/03/19 0020 01/03/19 0353   01/03/19 0015  ceFEPIme (MAXIPIME) 2 g in sodium chloride 0.9 % 100 mL IVPB     2 g 200 mL/hr over 30 Minutes Intravenous  Once 01/03/19 0005 01/03/19 0137   01/03/19 0015  metroNIDAZOLE (FLAGYL) IVPB 500 mg     500 mg 100 mL/hr over 60 Minutes Intravenous  Once 01/03/19 0005 01/03/19 0213   01/03/19 0015  vancomycin (VANCOCIN) IVPB 1000 mg/200 mL premix  Status:  Discontinued     1,000 mg 200 mL/hr over 60 Minutes Intravenous  Once 01/03/19 0005 01/03/19 0020       Subjective: Confused this AM  Objective: Vitals:   01/04/19 0900 01/04/19 1000 01/04/19 1100 01/04/19 1200  BP:  (!) 139/50 (!) 152/58   Pulse: 66 61 61   Resp: 15 14 15    Temp:    98.2 F (36.8 C)  TempSrc:    Oral  SpO2: 93% 94% 93%   Weight:      Height:        Intake/Output Summary (Last 24 hours) at 01/04/2019 1535 Last data filed at 01/04/2019 1154 Gross per 24 hour  Intake 1970.35 ml  Output 700 ml  Net 1270.35 ml   Filed Weights   01/03/19 0244 01/04/19 0500  Weight: 108.4 kg 107.8 kg    Examination:  General exam: Appears calm and comfortable  Respiratory system: Clear to auscultation. Respiratory effort normal. Cardiovascular system: S1 & S2 heard, RRR. Gastrointestinal system: Abdomen is nondistended, soft and nontender. No organomegaly or masses felt. Normal bowel sounds heard. Central nervous system: Alert and oriented. No focal neurological deficits. Extremities: Symmetric 5 x 5 power. Skin: No rashes, lesions  Psychiatry: Appears confused at this time, difficult to assess  Data Reviewed: I have personally reviewed following labs and imaging studies  CBC: Recent Labs  Lab 01/03/19 0005 01/03/19 0436 01/04/19 0254  WBC 6.4 6.0 5.6  NEUTROABS 4.0  --  2.9  HGB 13.5 11.1* 11.3*  HCT 45.2 37.5 38.4  MCV 97.2  95.7 98.7  PLT 172 136* 151   Basic Metabolic Panel: Recent Labs  Lab 01/03/19 0005 01/03/19 0436 01/04/19 0254  NA 140 142 142  K 4.3 4.4 3.5  CL 102 106 109  CO2 GLUCOSE 127* 121* 96  BUN CREATININE 0.62 0.61 0.50  CALCIUM 10.7* 9.6 9.4  MG  --   --  1.9   GFR: Estimated Creatinine Clearance: 75.5 mL/min (by C-G formula based on SCr of 0.5 mg/dL). Liver Function Tests: Recent Labs  Lab 01/03/19 0005 01/03/19 0436 01/04/19 0254  AST ALT ALKPHOS 76 60 57  BILITOT 0.6 1.0 0.6  PROT 6.9 5.3* 5.4*  ALBUMIN 3.6 2.7* 2.6*   No results for input(s): LIPASE, AMYLASE in the last 168 hours. No results for input(s): AMMONIA in the last 168 hours. Coagulation Profile: Recent Labs  Lab 01/03/19 0005  INR 0.9   Cardiac Enzymes: Recent Labs  Lab 01/03/19 0005  CKTOTAL 148  CKMB 10.9*   BNP (last 3 results) No results for input(s): PROBNP in the last 8760 hours. HbA1C: No results for input(s): HGBA1C in the last 72 hours. CBG: Recent Labs  Lab 01/02/19 2339  GLUCAP 112*   Lipid Profile: No results for input(s): CHOL, HDL, LDLCALC, TRIG, CHOLHDL, LDLDIRECT in the last 72 hours. Thyroid Function Tests: Recent Labs    01/03/19 0224  TSH 3.341   Anemia Panel: No results for input(s): VITAMINB12, FOLATE, FERRITIN, TIBC, IRON, RETICCTPCT in the last 72 hours. Sepsis Labs: Recent Labs  Lab 01/03/19 0005 01/03/19 0224  LATICACIDVEN 1.1 0.9    Recent Results (from the past 240 hour(s))  Blood Culture (routine x 2)     Status: None (Preliminary result)   Collection Time: 01/03/19 12:05 AM   Specimen: BLOOD LEFT HAND  Result Value Ref Range Status   Specimen Description   Final    BLOOD LEFT HAND Performed at [facility], [address], [place], [place] [address]    Special Requests   Final    BOTTLES DRAWN AEROBIC AND ANAEROBIC Blood Culture adequate volume Performed at [facility], [address] [place], [place] [address]    Culture   Final    NO GROWTH 1 DAY Performed at [facility], [address] [place], [place] [address]    Report Status PENDING  Incomplete  Urine culture     Status: None   Collection Time: 01/03/19 12:05 AM   Specimen: In/Out Cath Urine  Result Value Ref Range Status   Specimen Description   Final    IN/OUT CATH URINE Performed at [facility], [address] [place], [place] [address]    Special Requests   Final    NONE Performed at [facility], [address] [place], [place] [address]    Culture   Final    NO GROWTH Performed at [facility], [address] [place], [place] [address]    Report Status 01/04/2019 FINAL  Final  SARS Coronavirus 2 (CEPHEID - Performed in [facility] hospital lab), Hosp Order     Status: None   Collection Time: 01/03/19 12:06 AM   Specimen: Nasopharyngeal Swab  Result Value Ref Range Status   SARS Coronavirus 2 NEGATIVE NEGATIVE Final    Comment: (NOTE) If result is NEGATIVE SARS-CoV-2 target nucleic acids are NOT DETECTED. The SARS-CoV-2 RNA is generally detectable in upper and lower  respiratory specimens during the acute phase of infection. The lowest  concentration of SARS-CoV-2 viral copies this assay can detect is 250  copies / mL. A negative result does not preclude SARS-CoV-2 infection  and should not be used as the sole basis for treatment or other  patient management decisions.  A negative result may occur with  improper specimen collection / handling, submission of specimen other  than nasopharyngeal swab, presence of viral mutation(s) within the  areas targeted by this assay, and inadequate number of viral copies  (<250 copies / mL). A negative result must be combined with clinical  observations, patient history, and epidemiological information. If result is POSITIVE SARS-CoV-2 target nucleic acids are DETECTED. The SARS-CoV-2 RNA  is generally detectable in upper and lower  respiratory specimens dur ing the acute phase of infection.  Positive  results are indicative of active infection with SARS-CoV-2.  Clinical  correlation with patient history and other diagnostic information is  necessary to determine patient infection status.  Positive results do  not rule out bacterial infection or co-infection with other viruses. If result is PRESUMPTIVE POSTIVE SARS-CoV-2 nucleic acids MAY BE PRESENT.   A presumptive positive result was obtained on the submitted specimen  and confirmed on repeat testing.  While 2019 novel coronavirus  (SARS-CoV-2) nucleic acids may be present in the submitted sample  additional confirmatory testing may be necessary for epidemiological  and / or clinical management purposes  to differentiate between  SARS-CoV-2 and other Sarbecovirus currently known to infect humans.  If clinically indicated additional testing with an alternate test  methodology [phone]([id number]) is advised. The SARS-CoV-2 RNA is generally  detectable in upper and lower respiratory sp ecimens during the acute  phase of infection. The expected result is Negative. Fact Sheet for Patients:  BoilerBrush.com.cyhttps://www.fda.gov/media/136312/download Fact Sheet for Healthcare Providers: https://pope.com/https://www.fda.gov/media/136313/download This test is not yet approved or cleared by the [place] FDA and has been authorized for detection and/or diagnosis of SARS-CoV-2 by FDA under an Emergency Use Authorization (EUA).  This EUA will remain in effect (meaning this test can be used) for the duration of the COVID-19 declaration under Section 564(b)(1) of the Act, 21 U.S.C. section 360bbb-3(b)(1), unless the authorization is terminated or revoked sooner. Performed at [facility], [address] [place], [place] [address]   Blood Culture (routine x 2)     Status: None (Preliminary result)   Collection Time: 01/03/19 12:58 AM   Specimen:  BLOOD RIGHT FOREARM  Result Value Ref Range Status   Specimen Description   Final    BLOOD RIGHT FOREARM Performed at [facility], [address] [place], [place] [address]    Special Requests  Final    BOTTLES DRAWN AEROBIC AND ANAEROBIC Blood Culture adequate volume Performed at [place] [address], [place], [place] [address]    Culture   Final    NO GROWTH 1 DAY Performed at [facility], [place] [address] [place], [place] [address]    Report Status PENDING  Incomplete  MRSA PCR Screening     Status: None   Collection Time: 01/03/19  3:33 AM   Specimen: Nasopharyngeal  Result Value Ref Range Status   MRSA by PCR NEGATIVE NEGATIVE Final    Comment:        The GeneXpert MRSA Assay (FDA approved for NASAL specimens only), is one component of a comprehensive MRSA colonization surveillance program. It is not intended to diagnose MRSA infection nor to guide or monitor treatment for MRSA infections. Performed at [facility], [place] [address], [place], [place] [address]      Radiology Studies: Dg Chest 1 View  Result Date: 01/03/2019 CLINICAL DATA:  73 year old female status post witnessed fall. EXAM: CHEST  1 VIEW COMPARISON:  Chest radiographs 06/21/2018 and earlier. FINDINGS: Supine AP view at 0014 hours. Mildly lower lung volumes. Stable cardiomegaly and mediastinal contours. Visualized tracheal air column is within normal limits. Allowing for portable technique the lungs are clear. Paucity of bowel gas in the upper abdomen. No acute osseous abnormality identified. IMPRESSION: No acute cardiopulmonary abnormality or acute traumatic injury identified. Electronically Signed   By: Genevie Ann M.D.   On: 01/03/2019 00:36   Ct Head Wo Contrast  Result Date: 01/03/2019 CLINICAL DATA:  72 y/o F; fall from wheelchair, laceration to upper lip. History of Alzheimer's and diabetes. EXAM: CT HEAD WITHOUT CONTRAST CT CERVICAL SPINE  WITHOUT CONTRAST TECHNIQUE: Multidetector CT imaging of the head and cervical spine was performed following the standard protocol without intravenous contrast. Multiplanar CT image reconstructions of the cervical spine were also generated. COMPARISON:  06/20/2018 CT head. 05/25/2018 CT cervical spine. FINDINGS: CT HEAD FINDINGS Brain: No evidence of acute infarction, hemorrhage, hydrocephalus, extra-axial collection or mass lesion/mass effect. Stable chronic microvascular ischemic changes and volume loss of the brain. Vascular: No hyperdense vessel or unexpected calcification. Skull: Normal. Negative for fracture or focal lesion. Sinuses/Orbits: No acute finding. Other: None. CT CERVICAL SPINE FINDINGS Alignment: Straightening of cervical lordosis. Skull base and vertebrae: No acute fracture. No primary bone lesion or focal pathologic process. Soft tissues and spinal canal: No prevertebral fluid or swelling. No visible canal hematoma. Disc levels: Stable cervical spondylosis with predominant discogenic degenerative changes greatest at the C5 through C7 levels. Moderate to severe C5-6 spinal canal stenosis. Uncovertebral hypertrophy encroach on bilateral C5-C7 neural foramen. Upper chest: Negative. Other: Aortic and carotid bifurcation calcific atherosclerosis. IMPRESSION: 1. No acute intracranial abnormality or calvarial fracture. 2. Stable chronic microvascular ischemic changes and volume loss of the brain. 3. No acute fracture or dislocation of cervical spine. 4. Stable cervical spondylosis greatest at the C5 through C7 levels. Moderate to severe C5-6 spinal canal stenosis. Electronically Signed   By: Kristine Garbe M.D.   On: 01/03/2019 01:55   Ct Cervical Spine Wo Contrast  Result Date: 01/03/2019 CLINICAL DATA:  73 y/o F; fall from wheelchair, laceration to upper lip. History of Alzheimer's and diabetes. EXAM: CT HEAD WITHOUT CONTRAST CT CERVICAL SPINE WITHOUT CONTRAST TECHNIQUE: Multidetector CT  imaging of the head and cervical spine was performed following the standard protocol without intravenous contrast. Multiplanar CT image reconstructions of the cervical spine  were also generated. COMPARISON:  06/20/2018 CT head. 05/25/2018 CT cervical spine. FINDINGS: CT HEAD FINDINGS Brain: No evidence of acute infarction, hemorrhage, hydrocephalus, extra-axial collection or mass lesion/mass effect. Stable chronic microvascular ischemic changes and volume loss of the brain. Vascular: No hyperdense vessel or unexpected calcification. Skull: Normal. Negative for fracture or focal lesion. Sinuses/Orbits: No acute finding. Other: None. CT CERVICAL SPINE FINDINGS Alignment: Straightening of cervical lordosis. Skull base and vertebrae: No acute fracture. No primary bone lesion or focal pathologic process. Soft tissues and spinal canal: No prevertebral fluid or swelling. No visible canal hematoma. Disc levels: Stable cervical spondylosis with predominant discogenic degenerative changes greatest at the C5 through C7 levels. Moderate to severe C5-6 spinal canal stenosis. Uncovertebral hypertrophy encroach on bilateral C5-C7 neural foramen. Upper chest: Negative. Other: Aortic and carotid bifurcation calcific atherosclerosis. IMPRESSION: 1. No acute intracranial abnormality or calvarial fracture. 2. Stable chronic microvascular ischemic changes and volume loss of the brain. 3. No acute fracture or dislocation of cervical spine. 4. Stable cervical spondylosis greatest at the C5 through C7 levels. Moderate to severe C5-6 spinal canal stenosis. Electronically Signed   By: Mitzi HansenLance  Furusawa-Stratton M.D.   On: 01/03/2019 01:55   Dg Hips Bilat W Or Wo Pelvis 5 Views  Result Date: 01/03/2019 CLINICAL DATA:  73 year old female status post witnessed fall. EXAM: DG HIP (WITH OR WITHOUT PELVIS) 5+V BILAT COMPARISON:  CT Abdomen and Pelvis 06/20/2018. FINDINGS: Femoral heads are normally located. Hip joint spaces appear stable and  symmetric. Osteopenia. No pelvic fracture identified. SI joints appear stable and within normal limits. Negative visible abdominal and pelvic visceral contours, chronic pelvic phleboliths. Proximal femur bone detail is suboptimal due to body habitus. No proximal femur fracture identified. IMPRESSION: No acute fracture or dislocation identified about the bilateral hips or pelvis. Electronically Signed   By: Odessa FlemingH  Hall M.D.   On: 01/03/2019 00:39    Scheduled Meds: . Chlorhexidine Gluconate Cloth  6 each Topical Daily  . diazepam  2 mg Oral BID  . enoxaparin (LOVENOX) injection  40 mg Subcutaneous Q24H  . escitalopram  10 mg Oral Daily  . furosemide  40 mg Oral QODAY  . ketoconazole   Topical BID  . mouth rinse  15 mL Mouth Rinse BID  . memantine  28 mg Oral Daily  . potassium chloride SA  20 mEq Oral Daily  . simvastatin  40 mg Oral Daily  . traZODone  50 mg Oral QHS   Continuous Infusions: . sodium chloride 70 mL/hr at 01/04/19 1154  . cefTRIAXone (ROCEPHIN)  IV Stopped (01/04/19 1145)     LOS: 1 day   Rickey BarbaraStephen Chiu, MD [facility] Pager On Amion  If 7PM-7AM, please contact night-coverage 01/04/2019, 3:35 PM

## 2019-01-04 NOTE — Plan of Care (Signed)
reviewed

## 2019-01-04 NOTE — Progress Notes (Signed)
RN contacted [facility] to confirm pt's baseline status.  Spoke with Tammy who advised that until Sat, pt was ambulatory though working with PT.  She also advised that pt has [place] through [facility] for the BLE swelling.  Pt normally can feed herself after set up and can answer questions and carry on a conversation with another person, though confused from dementia.    Pt was able to eat all of her oatmeal as a total feeding assist this morning.  She was not able to manage the sausage and fell asleep while taking a first bite of french toast.  Updated Dr. Wyline Copas.

## 2019-01-05 LAB — COMPREHENSIVE METABOLIC PANEL
ALT: 18 U/L (ref 0–44)
AST: 19 U/L (ref 15–41)
Albumin: 2.7 g/dL — ABNORMAL LOW (ref 3.5–5.0)
Alkaline Phosphatase: 63 U/L (ref 38–126)
Anion gap: 7 (ref 5–15)
BUN: 9 mg/dL (ref 8–23)
CO2: 27 mmol/L (ref 22–32)
Calcium: 9.3 mg/dL (ref 8.9–10.3)
Chloride: 104 mmol/L (ref 98–111)
Creatinine, Ser: 0.54 mg/dL (ref 0.44–1.00)
GFR calc Af Amer: >60 mL/min (ref >60)
GFR calc non Af Amer: >60 mL/min (ref >60)
Glucose, Bld: 100 mg/dL — ABNORMAL HIGH (ref 70–99)
Potassium: 3.5 mmol/L (ref 3.5–5.1)
Sodium: 138 mmol/L (ref 135–145)
Total Bilirubin: 0.6 mg/dL (ref 0.3–1.2)
Total Protein: 5.4 g/dL — ABNORMAL LOW (ref 6.5–8.1)

## 2019-01-05 LAB — CBC WITH DIFFERENTIAL/PLATELET
Abs Immature Granulocytes: 0.03 10*3/uL (ref 0.00–0.07)
Basophils Absolute: 0 10*3/uL (ref 0.0–0.1)
Basophils Relative: 1 %
Eosinophils Absolute: 0.1 10*3/uL (ref 0.0–0.5)
Eosinophils Relative: 1 %
HCT: 36.7 % (ref 36.0–46.0)
Hemoglobin: 11.1 g/dL — ABNORMAL LOW (ref 12.0–15.0)
Immature Granulocytes: 1 %
Lymphocytes Relative: 32 %
Lymphs Abs: 1.9 10*3/uL (ref 0.7–4.0)
MCH: 29.4 pg (ref 26.0–34.0)
MCHC: 30.2 g/dL (ref 30.0–36.0)
MCV: 97.1 fL (ref 80.0–100.0)
Monocytes Absolute: 0.6 10*3/uL (ref 0.1–1.0)
Monocytes Relative: 10 %
Neutro Abs: 3.2 10*3/uL (ref 1.7–7.7)
Neutrophils Relative %: 55 %
Platelets: 136 10*3/uL — ABNORMAL LOW (ref 150–400)
RBC: 3.78 MIL/uL — ABNORMAL LOW (ref 3.87–5.11)
RDW: 17.2 % — ABNORMAL HIGH (ref 11.5–15.5)
WBC: 5.9 10*3/uL (ref 4.0–10.5)
nRBC: 0 % (ref 0.0–0.2)

## 2019-01-05 LAB — GLUCOSE, CAPILLARY
Glucose-Capillary: 88 mg/dL (ref 70–99)
Glucose-Capillary: 117 mg/dL — ABNORMAL HIGH (ref 70–99)
Glucose-Capillary: 90 mg/dL (ref 70–99)
Glucose-Capillary: 109 mg/dL — ABNORMAL HIGH (ref 70–99)

## 2019-01-05 MED ORDER — POTASSIUM CHLORIDE CRYS ER 20 MEQ PO TBCR: 20.0000 meq | EXTENDED_RELEASE_TABLET | Freq: Every day | ORAL | Status: DC

## 2019-01-05 MED ORDER — ADULT MULTIVITAMIN W/MINERALS CH
1.0000 | ORAL_TABLET | Freq: Every day | ORAL | Status: DC
  Administered 2019-01-05 – 2019-01-13 (×8): 1 via ORAL
  Filled 2019-01-05 (×8): qty 1

## 2019-01-05 MED ORDER — POTASSIUM CHLORIDE CRYS ER 20 MEQ PO TBCR
40.0000 meq | EXTENDED_RELEASE_TABLET | Freq: Once | ORAL | Status: AC
  Administered 2019-01-05: 40 meq via ORAL
  Filled 2019-01-05: qty 2

## 2019-01-05 NOTE — Progress Notes (Signed)
Initial Nutrition Assessment  RD working remotely.   DOCUMENTATION CODES:   Obesity unspecified  INTERVENTION:  - continue Ensure Enlive BID, each supplement provides 350 kcal and 20 grams of protein. - will order Magic Cup BID with meals, each supplement provides 290 kcal and 9 grams of protein. - will order daily multivitamin with minerals. - continue to encourage PO intakes and floor staff to assist with meals, if needed.    NUTRITION DIAGNOSIS:   Inadequate oral intake related to acute illness, lethargy/confusion as evidenced by meal completion < 25%.  GOAL:   Patient will meet greater than or equal to 90% of their needs  MONITOR:   PO intake, Supplement acceptance, Labs, Weight trends  REASON FOR ASSESSMENT:   Malnutrition Screening Tool  ASSESSMENT:   73 year-old female with medical history of Alzheimer's dementia and DM. She sustained a witnessed fall out of her wheelchair with subsequent laceration to the upper lip. She presented to the ED for evaluation and while in the ED was noted to be hypothermic and bradycardic.  Per chart review, patient is disoriented x4. Did not enter patient's room d/t this and wanting to limit contact/exposure. Per RN flow sheet, she consumed 10% of breakfast (54 kcal, 3 grams protein) yesterday and no other other documented since admission. Will monitor labs and medication needs closely and will determine at follow-up if recommendation should be made for diet liberalization.   Per chart review, current weight is 246 lb and last recorded weight PTA was on 07/22/18 when she weighed 267 lb. This indicates 21 lb weight loss (8% body weight) in the past 6 months; not significant for time frame.  Per notes: - hypothermia thought to possibly be d/t early sepsis--resolved - acute lower UTI - chronic, asymptomatic bradycardia - patient with hx of dementia    Medications reviewed; 40 mg oral lasix/day, sliding scale novolog, 20 mEq  K-Dur/day. Labs reviewed. IVF; NS @ 70 ml/hr.    NUTRITION - FOCUSED PHYSICAL EXAM:  unable to complete at this time.  Diet Order:   Diet Order            Diet heart healthy/carb modified Room service appropriate? Yes; Fluid consistency: Thin  Diet effective now              EDUCATION NEEDS:   Not appropriate for education at this time  Skin:  Skin Assessment: Reviewed RN Assessment  Last BM:  PTA/unknown  Height:   Ht Readings from Last 1 Encounters:  01/03/19 5\' 6"  (1.676 m)    Weight:   Wt Readings from Last 1 Encounters:  01/05/19 111.8 kg    Ideal Body Weight:  59.1 kg  BMI:  Body mass index is 39.78 kg/m.  Estimated Nutritional Needs:   Kcal:  1680-1900 kcal  Protein:  65-75 grams  Fluid:  >/= 1.8 L/day     Penny Matin, MS, RD, LDN, [facility] Inpatient Clinical Dietitian Pager # [phone] After hours/weekend pager # [phone]

## 2019-01-05 NOTE — Progress Notes (Signed)
PROGRESS NOTE    Penny Carr  WUJ:[id number] DOB: July 26, 1945 DOA: 01/02/2019 PCP: Samuel JesterButler, Cynthia, DO (Inactive)    Brief Narrative:  73 y.o. female, w dementia, apparently has witnessed fall out of wheel chair, laceration on the upper lip and presents to ER for evaluation. In the ED, pt was found to be hypothermic and bradycardic  Assessment & Plan:   Principal Problem:   Hypothermia Active Problems:   Acute lower UTI   Bradycardia   Diabetes (HCC)   Dementia (HCC)   Hypothermia ? Secondary to early sepsis No osborne j wave on ekg was noted Blood culture x2 noted to be negative thus far Had stopped Depakote at time of presentation as it can cause hypothermia Had also stopped Haldol since it too can cause hypothermia Hypothermia since resolved  Acute lower uti Blood culture x2 pending, neg thus far Urine culture pending, thus far no growth Pt was started on empiric Rocephin 1gm iv qday  Bradycardia (chronic), asymptomatic Continued on Tele Trop neg thus far NOTE Namenda, Lexapro, Depakote can cause bradycardia Stable at present, vital signs reviewed  Dm2 - Will order SSI coverage - Glucose remains stable at present  Hyperlipidemia -Cont Simvastatin 40mg  po qhs as tolerated -Stable at present  Dementia Cont Namenda XR 28mg  po qday Cont Valium 5mg  po bid => decreased to 2.5mg  po bid  Cont Lexapro 10mg  po qday Seems stable  DVT prophylaxis: Lovenox subQ Code Status: Full Family Communication: Pt in room, family not at bedside Disposition Plan: Uncertain at this time, PT/OT eval pending  Consultants:     Procedures:     Antimicrobials: Anti-infectives (From admission, onward)   Start     Dose/Rate Route Frequency Ordered Stop   01/03/19 1000  cefTRIAXone (ROCEPHIN) 1 g in sodium chloride 0.9 % 100 mL IVPB  Status:  Discontinued     1 g 200 mL/hr over 30 Minutes Intravenous Every 24 hours 01/03/19 0247 01/03/19 0713   01/03/19 1000   cefTRIAXone (ROCEPHIN) 2 g in sodium chloride 0.9 % 100 mL IVPB     2 g 200 mL/hr over 30 Minutes Intravenous Every 24 hours 01/03/19 0713     01/03/19 0030  vancomycin (VANCOCIN) 2,000 mg in sodium chloride 0.9 % 500 mL IVPB     2,000 mg 250 mL/hr over 120 Minutes Intravenous  Once 01/03/19 0020 01/03/19 0353   01/03/19 0015  ceFEPIme (MAXIPIME) 2 g in sodium chloride 0.9 % 100 mL IVPB     2 g 200 mL/hr over 30 Minutes Intravenous  Once 01/03/19 0005 01/03/19 0137   01/03/19 0015  metroNIDAZOLE (FLAGYL) IVPB 500 mg     500 mg 100 mL/hr over 60 Minutes Intravenous  Once 01/03/19 0005 01/03/19 0213   01/03/19 0015  vancomycin (VANCOCIN) IVPB 1000 mg/200 mL premix  Status:  Discontinued     1,000 mg 200 mL/hr over 60 Minutes Intravenous  Once 01/03/19 0005 01/03/19 0020      Subjective: Remains somewhat confused this AM  Objective: Vitals:   01/04/19 1718 01/04/19 2140 01/05/19 0447 01/05/19 1320  BP: (!) 156/74 (!) 146/68 (!) 140/52 (!) 139/58  Pulse: 75 61 (!) 55 68  Resp: 17 14 14 16   Temp: 98.9 F (37.2 C) 98.4 F (36.9 C) 98.2 F (36.8 C) 98.5 F (36.9 C)  TempSrc: Oral Oral Oral Oral  SpO2: 92% 94% 92% 94%  Weight:   111.8 kg   Height:        Intake/Output Summary (  Last 24 hours) at 01/05/2019 1503 Last data filed at 01/05/2019 1300 Gross per 24 hour  Intake 1316.47 ml  Output 1450 ml  Net -133.53 ml   Filed Weights   01/03/19 0244 01/04/19 0500 01/05/19 0447  Weight: 108.4 kg 107.8 kg 111.8 kg    Examination: General exam: Awake, laying in bed, in nad Respiratory system: Normal respiratory effort, no wheezing Cardiovascular system: regular rate, s1, s2 Gastrointestinal system: Soft, nondistended, positive BS Central nervous system: CN2-12 grossly intact, strength intact Extremities: Perfused, no clubbing Skin: Normal skin turgor, no notable skin lesions seen Psychiatry: difficult to assess as pt not vocal   Data Reviewed: I have personally reviewed  following labs and imaging studies  CBC: Recent Labs  Lab 01/03/19 0005 01/03/19 0436 01/04/19 0254 01/05/19 0403  WBC 6.4 6.0 5.6 5.9  NEUTROABS 4.0  --  2.9 3.2  HGB 13.5 11.1* 11.3* 11.1*  HCT 45.2 37.5 38.4 36.7  MCV 97.2 95.7 98.7 97.1  PLT 172 136* 151 136*   Basic Metabolic Panel: Recent Labs  Lab 01/03/19 0005 01/03/19 0436 01/04/19 0254 01/05/19 0403  NA 140 142 142 138  K 4.3 4.4 3.5 3.5  CL 102 106 109 104  CO2 28 27 26 27   GLUCOSE 127* 121* 96 100*  BUN 20 17 10 9   CREATININE 0.62 0.61 0.50 0.54  CALCIUM 10.7* 9.6 9.4 9.3  MG  --   --  1.9  --    GFR: Estimated Creatinine Clearance: 77 mL/min (by C-G formula based on SCr of 0.54 mg/dL). Liver Function Tests: Recent Labs  Lab 01/03/19 0005 01/03/19 0436 01/04/19 0254 01/05/19 0403  AST 20 23 17 19   ALT 21 17 16 18   ALKPHOS 76 60 57 63  BILITOT 0.6 1.0 0.6 0.6  PROT 6.9 5.3* 5.4* 5.4*  ALBUMIN 3.6 2.7* 2.6* 2.7*   No results for input(s): LIPASE, AMYLASE in the last 168 hours. No results for input(s): AMMONIA in the last 168 hours. Coagulation Profile: Recent Labs  Lab 01/03/19 0005  INR 0.9   Cardiac Enzymes: Recent Labs  Lab 01/03/19 0005  CKTOTAL 148  CKMB 10.9*   BNP (last 3 results) No results for input(s): PROBNP in the last 8760 hours. HbA1C: Recent Labs    01/04/19 1540  HGBA1C 5.8*   CBG: Recent Labs  Lab 01/02/19 2339 01/04/19 1719 01/04/19 2137 01/05/19 0737 01/05/19 1159  GLUCAP 112* 77 89 88 117*   Lipid Profile: No results for input(s): CHOL, HDL, LDLCALC, TRIG, CHOLHDL, LDLDIRECT in the last 72 hours. Thyroid Function Tests: Recent Labs    01/03/19 0224  TSH 3.341   Anemia Panel: No results for input(s): VITAMINB12, FOLATE, FERRITIN, TIBC, IRON, RETICCTPCT in the last 72 hours. Sepsis Labs: Recent Labs  Lab 01/03/19 0005 01/03/19 0224  LATICACIDVEN 1.1 0.9    Recent Results (from the past 240 hour(s))  Blood Culture (routine x 2)     Status:  None (Preliminary result)   Collection Time: 01/03/19 12:05 AM   Specimen: BLOOD LEFT HAND  Result Value Ref Range Status   Specimen Description   Final    BLOOD LEFT HAND Performed at [facility], [address] [place], [place] [address]    Special Requests   Final    BOTTLES DRAWN AEROBIC AND ANAEROBIC Blood Culture adequate volume Performed at [facility], [address] [place], [place] [address]    Culture   Final    NO GROWTH  2 DAYS Performed at [facility], [address] [place], [place] [address]    Report Status PENDING  Incomplete  Urine culture     Status: None   Collection Time: 01/03/19 12:05 AM   Specimen: In/Out Cath Urine  Result Value Ref Range Status   Specimen Description   Final    IN/OUT CATH URINE Performed at [facility], [address] [place], [place] [address]    Special Requests   Final    NONE Performed at [facility], [address] [place], [place] [address]    Culture   Final    NO GROWTH Performed at [facility], [address] [place], [place] [address]    Report Status 01/04/2019 FINAL  Final  SARS Coronavirus 2 (CEPHEID - Performed in [facility] lab), Hosp Order     Status: None   Collection Time: 01/03/19 12:06 AM   Specimen: Nasopharyngeal Swab  Result Value Ref Range Status   SARS Coronavirus 2 NEGATIVE NEGATIVE Final    Comment: (NOTE) If result is NEGATIVE SARS-CoV-2 target nucleic acids are NOT DETECTED. The SARS-CoV-2 RNA is generally detectable in upper and lower  respiratory specimens during the acute phase of infection. The lowest  concentration of SARS-CoV-2 viral copies this assay can detect is 250  copies / mL. A negative result does not preclude SARS-CoV-2 infection  and should not be used as the sole basis for treatment or other  patient management decisions.  A negative result may occur with  improper specimen collection  / handling, submission of specimen other  than nasopharyngeal swab, presence of viral mutation(s) within the  areas targeted by this assay, and inadequate number of viral copies  (<250 copies / mL). A negative result must be combined with clinical  observations, patient history, and epidemiological information. If result is POSITIVE SARS-CoV-2 target nucleic acids are DETECTED. The SARS-CoV-2 RNA is generally detectable in upper and lower  respiratory specimens dur ing the acute phase of infection.  Positive  results are indicative of active infection with SARS-CoV-2.  Clinical  correlation with patient history and other diagnostic information is  necessary to determine patient infection status.  Positive results do  not rule out bacterial infection or co-infection with other viruses. If result is PRESUMPTIVE POSTIVE SARS-CoV-2 nucleic acids MAY BE PRESENT.   A presumptive positive result was obtained on the submitted specimen  and confirmed on repeat testing.  While 2019 novel coronavirus  (SARS-CoV-2) nucleic acids may be present in the submitted sample  additional confirmatory testing may be necessary for epidemiological  and / or clinical management purposes  to differentiate between  SARS-CoV-2 and other Sarbecovirus currently known to infect humans.  If clinically indicated additional testing with an alternate test  methodology [phone]([id number]) is advised. The SARS-CoV-2 RNA is generally  detectable in upper and lower respiratory sp ecimens during the acute  phase of infection. The expected result is Negative. Fact Sheet for Patients:  BoilerBrush.com.cyhttps://www.fda.gov/media/136312/download Fact Sheet for Healthcare Providers: https://pope.com/https://www.fda.gov/media/136313/download This test is not yet approved or cleared by the [place] FDA and has been authorized for detection and/or diagnosis of SARS-CoV-2 by FDA under an Emergency Use Authorization (EUA).  This EUA will remain in effect (meaning this  test can be used) for the duration of the COVID-19 declaration under Section 564(b)(1) of the Act, 21 U.S.C. section 360bbb-3(b)(1), unless the authorization is terminated or revoked sooner. Performed at [facility],  [place] [address], [place], [place] [address]   Blood Culture (routine x 2)     Status: None (Preliminary result)   Collection Time: 01/03/19 12:58 AM   Specimen: BLOOD RIGHT FOREARM  Result Value Ref Range Status   Specimen Description   Final    BLOOD RIGHT FOREARM Performed at [facility], [place] [address] [place], [place] [address]    Special Requests   Final    BOTTLES DRAWN AEROBIC AND ANAEROBIC Blood Culture adequate volume Performed at [place] [address], [place], [place] [address]    Culture   Final    NO GROWTH 2 DAYS Performed at [place] [address] [place],  [address]    Report Status PENDING  Incomplete  MRSA PCR Screening     Status: None   Collection Time: 01/03/19  3:33 AM   Specimen: Nasopharyngeal  Result Value Ref Range Status   MRSA by PCR NEGATIVE NEGATIVE Final    Comment:        The GeneXpert MRSA Assay (FDA approved for NASAL specimens only), is one component of a comprehensive MRSA colonization surveillance program. It is not intended to diagnose MRSA infection nor to guide or monitor treatment for MRSA infections. Performed at [facility], [place] [address] [place],  [address]      Radiology Studies: No results found.  Scheduled Meds: . diazepam  2 mg Oral BID  . enoxaparin (LOVENOX) injection  40 mg Subcutaneous Q24H  . escitalopram  10 mg Oral Daily  . feeding supplement (ENSURE ENLIVE)  237 mL Oral BID BM  . furosemide  40 mg Oral QODAY  . insulin aspart  0-15 Units Subcutaneous TID WC  . insulin aspart  0-5 Units Subcutaneous QHS  . ketoconazole   Topical BID  . mouth rinse  15 mL Mouth Rinse BID  . memantine  28 mg Oral  Daily  . multivitamin with minerals  1 tablet Oral Daily  . [START ON 01/06/2019] potassium chloride SA  20 mEq Oral Daily  . simvastatin  40 mg Oral Daily  . traZODone  50 mg Oral QHS   Continuous Infusions: . sodium chloride 70 mL/hr at 01/04/19 2235  . cefTRIAXone (ROCEPHIN)  IV 2 g (01/05/19 0958)     LOS: 2 days   Marylu Lund, MD [facility] Pager On Amion  If 7PM-7AM, please contact night-coverage 01/05/2019, 3:03 PM

## 2019-01-06 ENCOUNTER — Inpatient Hospital Stay (HOSPITAL_COMMUNITY): Payer: Medicare Other

## 2019-01-06 DIAGNOSIS — R9431 Abnormal electrocardiogram [ECG] [EKG]: Secondary | ICD-10-CM

## 2019-01-06 LAB — CBC WITH DIFFERENTIAL/PLATELET
Abs Immature Granulocytes: 0.03 10*3/uL (ref 0.00–0.07)
Basophils Absolute: 0 10*3/uL (ref 0.0–0.1)
Basophils Relative: 0 %
Eosinophils Absolute: 0.1 10*3/uL (ref 0.0–0.5)
Eosinophils Relative: 2 %
HCT: 38 % (ref 36.0–46.0)
Hemoglobin: 11.3 g/dL — ABNORMAL LOW (ref 12.0–15.0)
Immature Granulocytes: 1 %
Lymphocytes Relative: 27 %
Lymphs Abs: 1.7 10*3/uL (ref 0.7–4.0)
MCH: 28.9 pg (ref 26.0–34.0)
MCHC: 29.7 g/dL — ABNORMAL LOW (ref 30.0–36.0)
MCV: 97.2 fL (ref 80.0–100.0)
Monocytes Absolute: 0.7 10*3/uL (ref 0.1–1.0)
Monocytes Relative: 11 %
Neutro Abs: 3.8 10*3/uL (ref 1.7–7.7)
Neutrophils Relative %: 59 %
Platelets: 138 10*3/uL — ABNORMAL LOW (ref 150–400)
RBC: 3.91 MIL/uL (ref 3.87–5.11)
RDW: 17.2 % — ABNORMAL HIGH (ref 11.5–15.5)
WBC: 6.3 10*3/uL (ref 4.0–10.5)
nRBC: 0 % (ref 0.0–0.2)

## 2019-01-06 LAB — GLUCOSE, CAPILLARY
Glucose-Capillary: 113 mg/dL — ABNORMAL HIGH (ref 70–99)
Glucose-Capillary: 99 mg/dL (ref 70–99)
Glucose-Capillary: 151 mg/dL — ABNORMAL HIGH (ref 70–99)
Glucose-Capillary: 103 mg/dL — ABNORMAL HIGH (ref 70–99)

## 2019-01-06 LAB — COMPREHENSIVE METABOLIC PANEL
ALT: 20 U/L (ref 0–44)
AST: 22 U/L (ref 15–41)
Albumin: 2.6 g/dL — ABNORMAL LOW (ref 3.5–5.0)
Alkaline Phosphatase: 68 U/L (ref 38–126)
Anion gap: 10 (ref 5–15)
BUN: 10 mg/dL (ref 8–23)
CO2: 27 mmol/L (ref 22–32)
Calcium: 9.6 mg/dL (ref 8.9–10.3)
Chloride: 105 mmol/L (ref 98–111)
Creatinine, Ser: 0.58 mg/dL (ref 0.44–1.00)
GFR calc Af Amer: >60 mL/min (ref >60)
GFR calc non Af Amer: >60 mL/min (ref >60)
Glucose, Bld: 115 mg/dL — ABNORMAL HIGH (ref 70–99)
Potassium: 3.4 mmol/L — ABNORMAL LOW (ref 3.5–5.1)
Sodium: 142 mmol/L (ref 135–145)
Total Bilirubin: 0.5 mg/dL (ref 0.3–1.2)
Total Protein: 5.5 g/dL — ABNORMAL LOW (ref 6.5–8.1)

## 2019-01-06 LAB — ECHOCARDIOGRAM COMPLETE
Height: 66 in
Weight: 3806.02 oz

## 2019-01-06 MED ORDER — DIAZEPAM 2 MG PO TABS
2.0000 mg | ORAL_TABLET | Freq: Two times a day (BID) | ORAL | Status: DC | PRN
  Administered 2019-01-11: 2 mg via ORAL
  Filled 2019-01-06: qty 1

## 2019-01-06 MED ORDER — POTASSIUM CHLORIDE CRYS ER 20 MEQ PO TBCR: 40.0000 meq | EXTENDED_RELEASE_TABLET | Freq: Once | ORAL | Status: DC

## 2019-01-06 MED ORDER — POTASSIUM CHLORIDE CRYS ER 20 MEQ PO TBCR
40.0000 meq | EXTENDED_RELEASE_TABLET | Freq: Once | ORAL | Status: DC
  Filled 2019-01-06: qty 2

## 2019-01-06 MED ORDER — POTASSIUM CHLORIDE CRYS ER 20 MEQ PO TBCR
20.0000 meq | EXTENDED_RELEASE_TABLET | Freq: Every day | ORAL | Status: DC
  Administered 2019-01-07 – 2019-01-13 (×7): 20 meq via ORAL
  Filled 2019-01-06 (×7): qty 1

## 2019-01-06 NOTE — Progress Notes (Addendum)
Dr Earlie Counts has been notified re the bradycardia. He is reviewing her medications.   1400 Pt opens her  Barely opens her eyes when her name is called. She will not answer any questions. VSS.Marland KitchenBaxter Flattery RN  charge Nurse paged Dr Earlie Counts.  [phone]. She just stated that she was thirst. Drank 2 sips of water and ststed that's enough. Sometimes when I enter her room , she talking. She will not answer when asked who she is talking too.  1645 Penny Carr is more awake .Marland Kitchen She is not aware that she is in a hospital . She is watching TV.  1700 Bladder scan not performed. Pt voided 300 mls,

## 2019-01-06 NOTE — Care Management Important Message (Signed)
Important Message  Patient Details IM Letter given to [name] RN to present to the Patient Name: Penny Carr MRN: [id number] Date of Birth: [birth year]   Medicare Important Message Given:  Yes     Kerin Salen 01/06/2019, 10:26 AM

## 2019-01-06 NOTE — Progress Notes (Signed)
Penny Carr is in a deep sleep . I called her name 3 times before she opened  her eyes. I asked her if she knew where she was and she stated "no". I will give her am meds once she is more awake

## 2019-01-06 NOTE — Progress Notes (Signed)
Pt. HR decreased to low 40's high 30's while sleeping. Pt. Able to arouse. On-call notified will continue to monitor.

## 2019-01-06 NOTE — Progress Notes (Signed)
PT Cancellation Note  Patient Details Name: JADZIA IBSEN MRN: [id number] DOB: 05/26/1946   Cancelled Treatment:    Reason Eval/Treat Not Completed: Medical issues which prohibited therapy Pt with bradycardia and decreased responsiveness.  Eyes opened and slightly turned head once.  Nursing taking vitals.  Will check back as schedule permits.   Jaheim Canino,KATHrine E 01/06/2019, 2:44 PM [place], DPT Acute Rehabilitation Services Office: [phone] Pager: [phone]

## 2019-01-06 NOTE — Progress Notes (Signed)
Xcover Bradycardia while sleeping to 30-40's Asymptomatic  A/P Bradycardia 12 lead ekg Continue to monitor

## 2019-01-06 NOTE — Progress Notes (Signed)
OT Cancellation Note  Patient Details Name: Penny Carr MRN: [id number] DOB: 09/14/1945   Cancelled Treatment:    Reason Eval/Treat Not Completed: Other (comment). Pt with bradycardia and decreased responsiveness.  Eyes opened and slightly turned head once.  Nursing taking vitals. Will check another day  Penny Carr 01/06/2019, 2:26 PM  Lesle Chris, OTR/L Acute Rehabilitation Services [phone] [facility] pager [phone] office 01/06/2019

## 2019-01-06 NOTE — Progress Notes (Signed)
PROGRESS NOTE    Secundino GingerDorretta S Eilts  ZOX:[id number] DOB: Nov 14, 1945 DOA: 01/02/2019 PCP: Samuel JesterButler, Cynthia, DO (Inactive)    Brief Narrative:  73 y.o. female, w dementia, apparently has witnessed fall out of wheel chair, laceration on the upper lip and presents to ER for evaluation. In the ED, pt was found to be hypothermic and bradycardic  Assessment & Plan:   Principal Problem:   Hypothermia Active Problems:   Acute lower UTI   Bradycardia   Diabetes (HCC)   Dementia (HCC)   Hypothermia  ? Secondary to early sepsis No osborne j wave on ekg was noted Blood culture x2 noted to be negative thus far Had stopped Depakote at time of presentation as it can cause hypothermia Had also stopped Haldol since it too can cause hypothermia Hypothermia since resolved  Acute lower uti Blood culture x2 pending, neg thus far Urine culture pending, thus far no growth Pt was started on empiric Rocephin 1gm iv qday Afebrile this AM  Bradycardia (chronic), asymptomatic Continued on Tele Trop neg thus far NOTE Namenda, Lexapro, Depakote can cause bradycardia Bradycardic this AM, asymptomatic. Will hold lexapro and namenda Valium may also cause bradycardia. Will change to PRN only  Dm2 - Will order SSI coverage - Glucose remains stable at this time  Hyperlipidemia -Cont Simvastatin 40mg  po qhs as tolerated -Presently stable  Dementia Namenda XR 28mg  po qday held per above Lexapro 10mg  po qday held per above Held valium per above  DVT prophylaxis: Lovenox subQ Code Status: Full Family Communication: Pt in room, family not at bedside Disposition Plan: Uncertain at this time, PT/OT eval pending  Consultants:     Procedures:     Antimicrobials: Anti-infectives (From admission, onward)   Start     Dose/Rate Route Frequency Ordered Stop   01/03/19 1000  cefTRIAXone (ROCEPHIN) 1 g in sodium chloride 0.9 % 100 mL IVPB  Status:  Discontinued     1 g 200 mL/hr over 30  Minutes Intravenous Every 24 hours 01/03/19 0247 01/03/19 0713   01/03/19 1000  cefTRIAXone (ROCEPHIN) 2 g in sodium chloride 0.9 % 100 mL IVPB     2 g 200 mL/hr over 30 Minutes Intravenous Every 24 hours 01/03/19 0713     01/03/19 0030  vancomycin (VANCOCIN) 2,000 mg in sodium chloride 0.9 % 500 mL IVPB     2,000 mg 250 mL/hr over 120 Minutes Intravenous  Once 01/03/19 0020 01/03/19 0353   01/03/19 0015  ceFEPIme (MAXIPIME) 2 g in sodium chloride 0.9 % 100 mL IVPB     2 g 200 mL/hr over 30 Minutes Intravenous  Once 01/03/19 0005 01/03/19 0137   01/03/19 0015  metroNIDAZOLE (FLAGYL) IVPB 500 mg     500 mg 100 mL/hr over 60 Minutes Intravenous  Once 01/03/19 0005 01/03/19 0213   01/03/19 0015  vancomycin (VANCOCIN) IVPB 1000 mg/200 mL premix  Status:  Discontinued     1,000 mg 200 mL/hr over 60 Minutes Intravenous  Once 01/03/19 0005 01/03/19 0020      Subjective: Without complaints this AM, asleep, arousable  Objective: Vitals:   01/05/19 1320 01/05/19 2202 01/06/19 0643 01/06/19 1405  BP: (!) 139/58 (!) 149/60 (!) 149/58 (!) 153/54  Pulse: 68 (!) 54 (!) 52 (!) 41  Resp: 16 16 15 16   Temp: 98.5 F (36.9 C) 98.2 F (36.8 C) 97.9 F (36.6 C) (!) 97.5 F (36.4 C)  TempSrc: Oral Oral Oral Oral  SpO2: 94% 95% 92% 96%  Weight:  107.9 kg   Height:        Intake/Output Summary (Last 24 hours) at 01/06/2019 1555 Last data filed at 01/06/2019 0647 Gross per 24 hour  Intake 1616.38 ml  Output 3100 ml  Net -1483.62 ml   Filed Weights   01/04/19 0500 01/05/19 0447 01/06/19 0643  Weight: 107.8 kg 111.8 kg 107.9 kg    Examination: General exam: Conversant, in no acute distress Respiratory system: normal chest rise, clear, no audible wheezing Cardiovascular system: bradycardic, s1-s2 Gastrointestinal system: Nondistended, nontender, pos BS Central nervous system: No seizures, no tremors Extremities: No cyanosis, no joint deformities Skin: No rashes, no pallor Psychiatry:  difficult to assess as pt is asleep, arousable  Data Reviewed: I have personally reviewed following labs and imaging studies  CBC: Recent Labs  Lab 01/03/19 0005 01/03/19 0436 01/04/19 0254 01/05/19 0403 01/06/19 0423  WBC 6.4 6.0 5.6 5.9 6.3  NEUTROABS 4.0  --  2.9 3.2 3.8  HGB 13.5 11.1* 11.3* 11.1* 11.3*  HCT 45.2 37.5 38.4 36.7 38.0  MCV 97.2 95.7 98.7 97.1 97.2  PLT 172 136* 151 136* 138*   Basic Metabolic Panel: Recent Labs  Lab 01/03/19 0005 01/03/19 0436 01/04/19 0254 01/05/19 0403 01/06/19 0423  NA 140 142 142 138 142  K 4.3 4.4 3.5 3.5 3.4*  CL 102 106 109 104 105  CO2 28 27 26 27 27   GLUCOSE 127* 121* 96 100* 115*  BUN 20 17 10 9 10   CREATININE 0.62 0.61 0.50 0.54 0.58  CALCIUM 10.7* 9.6 9.4 9.3 9.6  MG  --   --  1.9  --   --    GFR: Estimated Creatinine Clearance: 75.5 mL/min (by C-G formula based on SCr of 0.58 mg/dL). Liver Function Tests: Recent Labs  Lab 01/03/19 0005 01/03/19 0436 01/04/19 0254 01/05/19 0403 01/06/19 0423  AST 20 23 17 19 22   ALT 21 17 16 18 20   ALKPHOS 76 60 57 63 68  BILITOT 0.6 1.0 0.6 0.6 0.5  PROT 6.9 5.3* 5.4* 5.4* 5.5*  ALBUMIN 3.6 2.7* 2.6* 2.7* 2.6*   No results for input(s): LIPASE, AMYLASE in the last 168 hours. No results for input(s): AMMONIA in the last 168 hours. Coagulation Profile: Recent Labs  Lab 01/03/19 0005  INR 0.9   Cardiac Enzymes: Recent Labs  Lab 01/03/19 0005  CKTOTAL 148  CKMB 10.9*   BNP (last 3 results) No results for input(s): PROBNP in the last 8760 hours. HbA1C: Recent Labs    01/04/19 1540  HGBA1C 5.8*   CBG: Recent Labs  Lab 01/05/19 1159 01/05/19 1650 01/05/19 2158 01/06/19 0725 01/06/19 1157  GLUCAP 117* 90 109* 113* 99   Lipid Profile: No results for input(s): CHOL, HDL, LDLCALC, TRIG, CHOLHDL, LDLDIRECT in the last 72 hours. Thyroid Function Tests: No results for input(s): TSH, T4TOTAL, FREET4, T3FREE, THYROIDAB in the last 72 hours. Anemia Panel: No  results for input(s): VITAMINB12, FOLATE, FERRITIN, TIBC, IRON, RETICCTPCT in the last 72 hours. Sepsis Labs: Recent Labs  Lab 01/03/19 0005 01/03/19 0224  LATICACIDVEN 1.1 0.9    Recent Results (from the past 240 hour(s))  Blood Culture (routine x 2)     Status: None (Preliminary result)   Collection Time: 01/03/19 12:05 AM   Specimen: BLOOD LEFT HAND  Result Value Ref Range Status   Specimen Description   Final    BLOOD LEFT HAND Performed at [facility], [address] [place], [place] [address]    Special  Requests   Final    BOTTLES DRAWN AEROBIC AND ANAEROBIC Blood Culture adequate volume Performed at [facility], [address] [place], [place] [address]    Culture   Final    NO GROWTH 3 DAYS Performed at [facility], [address] [place], [place] [address]    Report Status PENDING  Incomplete  Urine culture     Status: None   Collection Time: 01/03/19 12:05 AM   Specimen: In/Out Cath Urine  Result Value Ref Range Status   Specimen Description   Final    IN/OUT CATH URINE Performed at [facility], [address] [place], [place] [address]    Special Requests   Final    NONE Performed at [facility], [address] [place], [place] [address]    Culture   Final    NO GROWTH Performed at [facility], [address] [place], [place] [address]    Report Status 01/04/2019 FINAL  Final  SARS Coronavirus 2 (CEPHEID - Performed in [facility] lab), Hosp Order     Status: None   Collection Time: 01/03/19 12:06 AM   Specimen: Nasopharyngeal Swab  Result Value Ref Range Status   SARS Coronavirus 2 NEGATIVE NEGATIVE Final    Comment: (NOTE) If result is NEGATIVE SARS-CoV-2 target nucleic acids are NOT DETECTED. The SARS-CoV-2 RNA is generally detectable in upper and lower  respiratory specimens during the acute phase of infection. The lowest  concentration of SARS-CoV-2  viral copies this assay can detect is 250  copies / mL. A negative result does not preclude SARS-CoV-2 infection  and should not be used as the sole basis for treatment or other  patient management decisions.  A negative result may occur with  improper specimen collection / handling, submission of specimen other  than nasopharyngeal swab, presence of viral mutation(s) within the  areas targeted by this assay, and inadequate number of viral copies  (<250 copies / mL). A negative result must be combined with clinical  observations, patient history, and epidemiological information. If result is POSITIVE SARS-CoV-2 target nucleic acids are DETECTED. The SARS-CoV-2 RNA is generally detectable in upper and lower  respiratory specimens dur ing the acute phase of infection.  Positive  results are indicative of active infection with SARS-CoV-2.  Clinical  correlation with patient history and other diagnostic information is  necessary to determine patient infection status.  Positive results do  not rule out bacterial infection or co-infection with other viruses. If result is PRESUMPTIVE POSTIVE SARS-CoV-2 nucleic acids MAY BE PRESENT.   A presumptive positive result was obtained on the submitted specimen  and confirmed on repeat testing.  While 2019 novel coronavirus  (SARS-CoV-2) nucleic acids may be present in the submitted sample  additional confirmatory testing may be necessary for epidemiological  and / or clinical management purposes  to differentiate between  SARS-CoV-2 and other Sarbecovirus currently known to infect humans.  If clinically indicated additional testing with an alternate test  methodology [phone]([id number]) is advised. The SARS-CoV-2 RNA is generally  detectable in upper and lower respiratory sp ecimens during the acute  phase of infection. The expected result is Negative. Fact Sheet for Patients:  BoilerBrush.com.cyhttps://www.fda.gov/media/136312/download Fact Sheet for Healthcare Providers:  https://pope.com/https://www.fda.gov/media/136313/download This test is not yet approved or cleared by the [place] FDA and has been authorized for detection and/or diagnosis of SARS-CoV-2 by FDA under an Emergency Use Authorization (EUA).  This EUA  will remain in effect (meaning this test can be used) for the duration of the COVID-19 declaration under Section 564(b)(1) of the Act, 21 U.S.C. section 360bbb-3(b)(1), unless the authorization is terminated or revoked sooner. Performed at [facility], [place] [address], [place], [place] [address]   Blood Culture (routine x 2)     Status: None (Preliminary result)   Collection Time: 01/03/19 12:58 AM   Specimen: BLOOD RIGHT FOREARM  Result Value Ref Range Status   Specimen Description   Final    BLOOD RIGHT FOREARM Performed at [facility], [place] [address], [place], [place] [address]    Special Requests   Final    BOTTLES DRAWN AEROBIC AND ANAEROBIC Blood Culture adequate volume Performed at [place] [address], [place], [place] [address]    Culture   Final    NO GROWTH 3 DAYS Performed at [facility], [place] [address], [place], [place] [address]    Report Status PENDING  Incomplete  MRSA PCR Screening     Status: None   Collection Time: 01/03/19  3:33 AM   Specimen: Nasopharyngeal  Result Value Ref Range Status   MRSA by PCR NEGATIVE NEGATIVE Final    Comment:        The GeneXpert MRSA Assay (FDA approved for NASAL specimens only), is one component of a comprehensive MRSA colonization surveillance program. It is not intended to diagnose MRSA infection nor to guide or monitor treatment for MRSA infections. Performed at [facility], [place] [address] [place], [place] [address]      Radiology Studies: No results found.  Scheduled Meds: . enoxaparin (LOVENOX) injection  40 mg Subcutaneous Q24H  . feeding supplement (ENSURE ENLIVE)  237 mL Oral BID BM  .  furosemide  40 mg Oral QODAY  . insulin aspart  0-15 Units Subcutaneous TID WC  . insulin aspart  0-5 Units Subcutaneous QHS  . ketoconazole   Topical BID  . mouth rinse  15 mL Mouth Rinse BID  . multivitamin with minerals  1 tablet Oral Daily  . [START ON 01/07/2019] potassium chloride SA  20 mEq Oral Daily  . potassium chloride  40 mEq Oral Once  . simvastatin  40 mg Oral Daily   Continuous Infusions: . sodium chloride 70 mL/hr at 01/06/19 0623  . cefTRIAXone (ROCEPHIN)  IV 2 g (01/06/19 1024)     LOS: 3 days   Marylu Lund, MD [facility] Pager On Amion  If 7PM-7AM, please contact night-coverage 01/06/2019, 3:55 PM

## 2019-01-06 NOTE — Progress Notes (Signed)
  Echocardiogram 2D Echocardiogram has been performed.  Penny Carr 01/06/2019, 2:07 PM

## 2019-01-07 LAB — CBC WITH DIFFERENTIAL/PLATELET
Abs Immature Granulocytes: 0.04 10*3/uL (ref 0.00–0.07)
Basophils Absolute: 0 10*3/uL (ref 0.0–0.1)
Basophils Relative: 1 %
Eosinophils Absolute: 0.2 10*3/uL (ref 0.0–0.5)
Eosinophils Relative: 2 %
HCT: 39.1 % (ref 36.0–46.0)
Hemoglobin: 11.9 g/dL — ABNORMAL LOW (ref 12.0–15.0)
Immature Granulocytes: 1 %
Lymphocytes Relative: 22 %
Lymphs Abs: 1.4 10*3/uL (ref 0.7–4.0)
MCH: 29.6 pg (ref 26.0–34.0)
MCHC: 30.4 g/dL (ref 30.0–36.0)
MCV: 97.3 fL (ref 80.0–100.0)
Monocytes Absolute: 0.6 10*3/uL (ref 0.1–1.0)
Monocytes Relative: 9 %
Neutro Abs: 4 10*3/uL (ref 1.7–7.7)
Neutrophils Relative %: 65 %
Platelets: 165 10*3/uL (ref 150–400)
RBC: 4.02 MIL/uL (ref 3.87–5.11)
RDW: 17.1 % — ABNORMAL HIGH (ref 11.5–15.5)
WBC: 6.2 10*3/uL (ref 4.0–10.5)
nRBC: 0 % (ref 0.0–0.2)

## 2019-01-07 LAB — COMPREHENSIVE METABOLIC PANEL
ALT: 22 U/L (ref 0–44)
AST: 21 U/L (ref 15–41)
Albumin: 2.8 g/dL — ABNORMAL LOW (ref 3.5–5.0)
Alkaline Phosphatase: 73 U/L (ref 38–126)
Anion gap: 8 (ref 5–15)
BUN: 9 mg/dL (ref 8–23)
CO2: 28 mmol/L (ref 22–32)
Calcium: 9.7 mg/dL (ref 8.9–10.3)
Chloride: 104 mmol/L (ref 98–111)
Creatinine, Ser: 0.57 mg/dL (ref 0.44–1.00)
GFR calc Af Amer: >60 mL/min (ref >60)
GFR calc non Af Amer: >60 mL/min (ref >60)
Glucose, Bld: 115 mg/dL — ABNORMAL HIGH (ref 70–99)
Potassium: 3.5 mmol/L (ref 3.5–5.1)
Sodium: 140 mmol/L (ref 135–145)
Total Bilirubin: 0.5 mg/dL (ref 0.3–1.2)
Total Protein: 5.8 g/dL — ABNORMAL LOW (ref 6.5–8.1)

## 2019-01-07 LAB — GLUCOSE, CAPILLARY
Glucose-Capillary: 104 mg/dL — ABNORMAL HIGH (ref 70–99)
Glucose-Capillary: 151 mg/dL — ABNORMAL HIGH (ref 70–99)
Glucose-Capillary: 115 mg/dL — ABNORMAL HIGH (ref 70–99)
Glucose-Capillary: 98 mg/dL (ref 70–99)

## 2019-01-07 LAB — MAGNESIUM: Magnesium: 2 mg/dL (ref 1.7–2.4)

## 2019-01-07 NOTE — [place] FL2 (Addendum)
[place] MEDICAID FL2 LEVEL OF CARE SCREENING TOOL     IDENTIFICATION  Patient Name: Penny Carr Birthdate: 12/19/1945 Sex: female Admission Date (Current Location): 01/02/2019  [facility] [place]  [employer]   Facility and Address:  [facility],  [address], [place] [address]      Provider Number: [id number]  Attending Physician Name and Address:  Jerald Kiefhiu, Stephen K, MD  Relative Name and Phone Number:       Current Level of Care: Hospital Recommended Level of Care: Skilled Nursing Facility Prior Approval Number:    Date Approved/Denied:   PASRR Number: Pending  Discharge Plan: SNF    Current Diagnoses: Patient Active Problem List   Diagnosis Date Noted  . Acute lower UTI 01/03/2019  . Hypothermia 01/03/2019  . Bradycardia 01/03/2019  . Diabetes (HCC) 01/03/2019  . Dementia (HCC) 01/03/2019  . History of sinus bradycardia 07/22/2018  . DIVERTICULITIS, HX OF 03/26/2010    Orientation RESPIRATION BLADDER Height & Weight     (Disoriented x4, Responds to voice)  Normal Incontinent Weight: 239 lb 10.2 oz (108.7 kg) Height:  5\' 6"  (167.6 cm)  BEHAVIORAL SYMPTOMS/MOOD NEUROLOGICAL BOWEL NUTRITION STATUS      Incontinent Diet Heart Healthy Carb Modified.   AMBULATORY STATUS COMMUNICATION OF NEEDS Skin   Extensive Assist Verbally Normal                       Personal Care Assistance Level of Assistance  Bathing, Feeding, Dressing Bathing Assistance: Maximum assistance Feeding assistance: Maximum assistance Dressing Assistance: Maximum assistance     Functional Limitations Info  Sight, Hearing, Speech Sight Info: Adequate Hearing Info: Adequate Speech Info: Adequate    SPECIAL CARE FACTORS FREQUENCY  PT (By licensed PT), OT (By licensed OT)     PT Frequency: 5x/week OT Frequency: 5x/week            Contractures Contractures Info: Not present    Additional Factors Info  Code Status, Allergies, Psychotropic,  Insulin Sliding Scale Code Status Info: Fullcode Allergies Info: Allergies: No Known Allergies   Insulin Sliding Scale Info: 0-15UNITS 3 times daily, 0-5UNITS Daily at Bedtime       Current Medications (01/07/2019):  This is the current hospital active medication list Current Facility-Administered Medications  Medication Dose Route Frequency Provider Last Rate Last Dose  . 0.9 %  sodium chloride infusion   Intravenous Continuous Jerald Kiefhiu, Stephen K, MD 70 mL/hr at 01/07/19 1201    . acetaminophen (TYLENOL) tablet 650 mg  650 mg Oral Q6H PRN Jerald Kiefhiu, Stephen K, MD       Or  . acetaminophen (TYLENOL) suppository 650 mg  650 mg Rectal Q6H PRN Jerald Kiefhiu, Stephen K, MD      . cefTRIAXone (ROCEPHIN) 2 g in sodium chloride 0.9 % 100 mL IVPB  2 g Intravenous Q24H Jerald Kiefhiu, Stephen K, MD 200 mL/hr at 01/07/19 0958 2 g at 01/07/19 0958  . diazepam (VALIUM) tablet 2 mg  2 mg Oral BID PRN Jerald Kiefhiu, Stephen K, MD      . enoxaparin (LOVENOX) injection 40 mg  40 mg Subcutaneous Q24H Jerald Kiefhiu, Stephen K, MD   40 mg at 01/07/19 0954  . feeding supplement (ENSURE ENLIVE) (ENSURE ENLIVE) liquid 237 mL  237 mL Oral BID BM Jerald Kiefhiu, Stephen K, MD   237 mL at 01/07/19 0949  . furosemide (LASIX) tablet 40 mg  40 mg Oral Edwin CapQODAY Chiu, Stephen K, MD   40 mg at 01/07/19  3536  . insulin aspart (novoLOG) injection 0-15 Units  0-15 Units Subcutaneous TID WC Donne Hazel, MD   3 Units at 01/07/19 1201  . insulin aspart (novoLOG) injection 0-5 Units  0-5 Units Subcutaneous QHS Donne Hazel, MD      . ketoconazole (NIZORAL) 2 % cream   Topical BID Donne Hazel, MD      . MEDLINE mouth rinse  15 mL Mouth Rinse BID Donne Hazel, MD   15 mL at 01/05/19 2351  . multivitamin with minerals tablet 1 tablet  1 tablet Oral Daily Donne Hazel, MD   1 tablet at 01/07/19 0949  . nystatin (MYCOSTATIN/NYSTOP) topical powder   Topical BID PRN Donne Hazel, MD      . potassium chloride SA (K-DUR) CR tablet 20 mEq  20 mEq Oral Daily Donne Hazel, MD    20 mEq at 01/07/19 0950  . potassium chloride SA (K-DUR) CR tablet 40 mEq  40 mEq Oral Once Donne Hazel, MD      . simvastatin (ZOCOR) tablet 40 mg  40 mg Oral Daily Donne Hazel, MD   40 mg at 01/07/19 1443     Discharge Medications: Please see discharge summary for a list of discharge medications.  Relevant Imaging Results:  Relevant Lab Results:   Additional Information SS#: [id number]  Lia Hopping, LCSW

## 2019-01-07 NOTE — Progress Notes (Signed)
PT Note  Patient Details Name: Penny Carr MRN: [id number] DOB: 11/30/1945       Eval completed today. Full write up to follow. Pt alert during entire session, required verbal and tactile cues, as well as MAX placement at times. Pt did respond at times ( <50%)  when increased time given for response and 1 -2 word phrases.  Sit to stand at EOB, but used the stedy lift equipment to assist pivot the patient to the recliner.   Pt will need assist with everything, and not sure of the level of assist her memory care was able to give her.   Full write up to follow.  Clide Dales, Tehila Sokolow 01/07/2019, 2:09 PM

## 2019-01-07 NOTE — Evaluation (Addendum)
Occupational Therapy Evaluation Patient Details Name: Penny Carr MRN: [id number] DOB: Jul 08, 1945 Today's Date: 01/07/2019    History of Present Illness Penny Carr  is a 73 y.o. female, w dementia, apparently has witnessed fall out of wheel chair, laceration on the upper lip and presents to ER for evaluation. She was found to have bradycardia and hypothermia.   Clinical Impression   Pt admitted with above diagnoses, decreased activity tol/generalized weakness and cognitive deficits at baseline decreasing ability to engage in BADL at desired level of ind. [facility]. She used a w/c and was ind with self feeding. Unsure of baseline level of care pt was receiving. At time of evaluation, pt needing max A+2 with use of stedy for bed mobility and OOB activity. Pt very slow to respond and needing cues to initiate all tasks. Facilitated self feeding for pt, pt needing mod A and cues as well as initiation and cues to continue engaging in meal. Recommend pt have supervision at meal times, RN informed. Recommend pt return to ALF with HHOT vs SNF depending on level of care ALF can provide. Will continue to follow per POC listed below.    Follow Up Recommendations  Home health OT;Supervision/Assistance - 24 hour;SNF;Other (comment)(pending how much care ALF can provide)    Equipment Recommendations  None recommended by OT    Recommendations for Other Services       Precautions / Restrictions Precautions Precautions: Fall Restrictions Weight Bearing Restrictions: No      Mobility Bed Mobility Overal bed mobility: Needs Assistance Bed Mobility: Supine to Sit     Supine to sit: Max assist;+2 for physical assistance;+2 for safety/equipment     General bed mobility comments: cues needed to initiate; assist at trunk and BLEs  Transfers Overall transfer level: Needs assistance   Transfers: Sit to/from Stand Sit to Stand: Max assist;+2 physical  assistance         General transfer comment: stedy used for t/f; initially trialed standing with RW, pt unable to maintain    Balance Overall balance assessment: Needs assistance Sitting-balance support: Bilateral upper extremity supported Sitting balance-Leahy Scale: Good     Standing balance support: Bilateral upper extremity supported Standing balance-Leahy Scale: Zero                             ADL either performed or assessed with clinical judgement   ADL Overall ADL's : Needs assistance/impaired Eating/Feeding: Moderate assistance;Sitting Eating/Feeding Details (indicate cue type and reason): needing mod A for task initiation, cutting up food, and cues to cont engaging in meal Grooming: Minimal assistance;Moderate assistance;Sitting Grooming Details (indicate cue type and reason): initated brushing teeth without toothpaste or water, needs many VC's and supervision Upper Body Bathing: Maximal assistance;Sitting   Lower Body Bathing: Total assistance;Sit to/from stand;Sitting/lateral leans   Upper Body Dressing : Maximal assistance;Sitting   Lower Body Dressing: Total assistance;Sit to/from stand;Sitting/lateral leans   Toilet Transfer: Maximal assistance;+2 for physical assistance;+2 for safety/equipment;Comfort height toilet;RW   Toileting- Clothing Manipulation and Hygiene: Total assistance;Sit to/from stand;Sitting/lateral lean       Functional mobility during ADLs: Maximal assistance;+2 for physical assistance;+2 for safety/equipment(with stedy) General ADL Comments: pt slow to respond and poor hx of previous ADL engagement/level of care     Vision   Vision Assessment?: No apparent visual deficits Additional Comments: unsure if pt wears glasses, overall functional for eval     Perception  Praxis      Pertinent Vitals/Pain Pain Assessment: Faces Faces Pain Scale: No hurt     Hand Dominance     Extremity/Trunk Assessment Upper  Extremity Assessment Upper Extremity Assessment: Generalized weakness   Lower Extremity Assessment Lower Extremity Assessment: Generalized weakness       Communication Communication Communication: Expressive difficulties   Cognition Arousal/Alertness: Awake/alert Behavior During Therapy: Flat affect Overall Cognitive Status: History of cognitive impairments - at baseline Area of Impairment: Orientation;Memory;Attention;Following commands;Problem solving                 Orientation Level: Person Current Attention Level: Sustained Memory: Decreased short-term memory Following Commands: Follows one step commands inconsistently;Follows one step commands with increased time     Problem Solving: Difficulty sequencing;Requires verbal cues;Slow processing;Decreased initiation;Requires tactile cues General Comments: hx of dementia at baseline, very slow to respond to single step cues. Easily distractible. Needs initiation and cues to start and finish tasks. At times will become tearful   General Comments       Exercises     Shoulder Instructions      Home Living Family/patient expects to be discharged to:: Assisted living([facility])                                 Additional Comments: unsure of baseline equipment considering pt is poor historian      Prior Functioning/Environment Level of Independence: Needs assistance        Comments: unable to get full hx of baseline care        OT Problem List: Decreased strength;Decreased knowledge of use of DME or AE;Obesity;Decreased activity tolerance;Decreased cognition;Impaired balance (sitting and/or standing);Decreased safety awareness      OT Treatment/Interventions: Self-care/ADL training;Therapeutic exercise;Patient/family education;Balance training;Energy conservation;Therapeutic activities;DME and/or AE instruction;Cognitive remediation/compensation    OT Goals(Current goals can be  found in the care plan section) Acute Rehab OT Goals OT Goal Formulation: Patient unable to participate in goal setting Time For Goal Achievement: 01/21/19 Potential to Achieve Goals: Good  OT Frequency: Min 2X/week   Barriers to D/C:            Co-evaluation              AM-PAC OT "6 Clicks" Daily Activity     Outcome Measure Help from another person eating meals?: A Lot Help from another person taking care of personal grooming?: A Lot Help from another person toileting, which includes using toliet, bedpan, or urinal?: A Lot Help from another person bathing (including washing, rinsing, drying)?: Total Help from another person to put on and taking off regular upper body clothing?: A Lot Help from another person to put on and taking off regular lower body clothing?: Total 6 Click Score: 10   End of Session Equipment Utilized During Treatment: Gait belt;Other (comment)(stedy) Nurse Communication: Mobility status;Need for lift equipment  Activity Tolerance: Patient tolerated treatment well Patient left: in chair;with chair alarm set;with call bell/phone within reach  OT Visit Diagnosis: Other abnormalities of gait and mobility (R26.89);Muscle weakness (generalized) (M62.81);History of falling (Z91.81);Other symptoms and signs involving cognitive function                Time: 1230-1330 OT Time Calculation (min): 60 min Charges:  OT General Charges $OT Visit: 1 Visit OT Evaluation $OT Eval Moderate Complexity: 1 Mod OT Treatments $Self Care/Home Management : 8-22 mins  Dalphine HandingKaylee Gennie Eisinger, MSOT, OTR/L [facility]  OT/ Acute Relief OT [facility] Office: [phone]  Zenovia Jarred 01/07/2019, 3:11 PM

## 2019-01-07 NOTE — Evaluation (Signed)
Physical Therapy Evaluation Patient Details Name: Penny Carr MRN: [id number] DOB: 1946/03/07 Today's Date: 01/07/2019   History of Present Illness  Penny Carr  is a 73 y.o. female, w dementia, apparently has witnessed fall out of wheel chair, laceration on the upper lip and presents to ER for evaluation. She was found to have bradycardia and hypothermia.  Clinical Impression  Pt admitted with above diagnoses, decreased activity tol/generalized weakness and cognitive deficits at baseline decreasing ability to engage in BADL at desired level of ind. PTA, pt at [facility]. She used a w/c and was ind with self feeding. Unsure of baseline level of care pt was receiving. Per chart states pt was receiving [facility] service for PT and was working on ambulation. At time of evaluation, pt needing max A+2 with use of sit to stand for attemnpt to transfer, but used stedy for OOB trasnfer to recliner. Pt did well with stedy and actually intitated free standing holding to steady for about 3 minutes during hygiene.. Pt very slow to respond and needing cues to initiate all tasks.  Recommend pt return to ALF with HHPT or SNF depending on level of care ALF can provide. Will continue to follow per POC listed below.    Follow Up Recommendations SNF;Supervision/Assistance - 24 hour;Home health PT    Equipment Recommendations       Recommendations for Other Services       Precautions / Restrictions Precautions Precautions: Fall Restrictions Weight Bearing Restrictions: No      Mobility  Bed Mobility Overal bed mobility: Needs Assistance Bed Mobility: Supine to Sit     Supine to sit: Max assist;+2 for physical assistance;+2 for safety/equipment     General bed mobility comments: cues and intcreaed time needed to initiate; assist at trunk and BLEs  Transfers Overall transfer level: Needs assistance Equipment used: Rolling walker (2 wheeled) Transfers: Sit to/from Stand Sit  to Stand: Max assist;+2 physical assistance         General transfer comment: stedy used for t/f; initially trialed standing with RW, pt unable to maintain. in stedy pt stood without knees on blocked surface and stood for about 3- minutes during hygiene . Tolerated well and seemed to be farily stronger. Just the stand pivot activity and intiating foot movment for pivot was difficult today, therefore we used the stedy. Wat alternating and shifting weight BLE while in stedy.  Ambulation/Gait                Stairs            Wheelchair Mobility    Modified Rankin (Stroke Patients Only)       Balance Overall balance assessment: Needs assistance Sitting-balance support: Bilateral upper extremity supported Sitting balance-Leahy Scale: Good     Standing balance support: Bilateral upper extremity supported Standing balance-Leahy Scale: Poor                               Pertinent Vitals/Pain Faces Pain Scale: No hurt    Home Living Family/patient expects to be discharged to:: Assisted living([facility])                 Additional Comments: unsure of baseline equipment considering pt is poor historian    Prior Function Level of Independence: Needs assistance         Comments: unable to get full hx of baseline care, chart states she  was receiving HHPT and ambulating with PT.     Hand Dominance        Extremity/Trunk Assessment        Lower Extremity Assessment Lower Extremity Assessment: Generalized weakness       Communication   Communication: Expressive difficulties  Cognition Arousal/Alertness: Awake/alert Behavior During Therapy: Flat affect Overall Cognitive Status: History of cognitive impairments - at baseline Area of Impairment: Orientation;Memory;Attention;Following commands;Problem solving                 Orientation Level: Person Current Attention Level: Sustained Memory: Decreased short-term  memory Following Commands: Follows one step commands inconsistently;Follows one step commands with increased time     Problem Solving: Difficulty sequencing;Requires verbal cues;Slow processing;Decreased initiation;Requires tactile cues General Comments: hx of dementia at baseline, very slow to respond to single step cues. Easily distractible. Needs initiation and cues to start and finish tasks. At times will become tearful      General Comments      Exercises     Assessment/Plan    PT Assessment Patient needs continued PT services  PT Problem List Decreased strength;Decreased activity tolerance;Decreased mobility       PT Treatment Interventions Gait training;Functional mobility training;Therapeutic activities    PT Goals (Current goals can be found in the Care Plan section)  Acute Rehab PT Goals PT Goal Formulation: Patient unable to participate in goal setting Time For Goal Achievement: 01/21/19 Potential to Achieve Goals: Fair    Frequency Min 2X/week(would benefit from 3x if able)   Barriers to discharge        Co-evaluation PT/OT/SLP Co-Evaluation/Treatment: Yes Reason for Co-Treatment: Necessary to address cognition/behavior during functional activity PT goals addressed during session: Mobility/safety with mobility OT goals addressed during session: ADL's and self-care       AM-PAC PT "6 Clicks" Mobility  Outcome Measure Help needed turning from your back to your side while in a flat bed without using bedrails?: A Lot Help needed moving from lying on your back to sitting on the side of a flat bed without using bedrails?: A Lot Help needed moving to and from a bed to a chair (including a wheelchair)?: A Lot Help needed standing up from a chair using your arms (e.g., wheelchair or bedside chair)?: A Lot Help needed to walk in hospital room?: Total Help needed climbing 3-5 steps with a railing? : Total 6 Click Score: 10    End of Session Equipment Utilized  During Treatment: Gait belt Activity Tolerance: Patient tolerated treatment well Patient left: in chair;with call bell/phone within reach(left lift pad under patient just in case, however the use of the stedy was educated to nursing staff for back to bed transfer.) Nurse Communication: Mobility status PT Visit Diagnosis: Muscle weakness (generalized) (M62.81);Difficulty in walking, not elsewhere classified (R26.2)    Time: 1225-1330 PT Time Calculation (min) (ACUTE ONLY): 65 min   Charges:   PT Evaluation $PT Eval Moderate Complexity: 1 Mod PT Treatments $Therapeutic Activity: 8-22 mins        Marella BileSharron Jimeka Balan, PT Acute Rehabilitation Services Pager: [phone] Office: [phone] 01/07/2019   Marella BileBRITT, Won Kreuzer 01/07/2019, 7:20 PM

## 2019-01-07 NOTE — TOC Initial Note (Signed)
Transition of Care (TOC) - Initial/Assessment Note    Patient Details  Name: Secundino GingerDorretta S Dario MRN: [id number] [facility] Nov 08, 1945  Transition of Care [facility](TOC) CM/SW Contact:    Clearance CootsNicole A Camellia Popescu, LCSW Phone Number: 01/07/2019, 1:46 PM  Clinical Narrative:                 CSW reached out to patient son to discuss discharge plan for SNF placement. [facility] and now requires more assistance. The facility unable to accept at this time due to the patient change in ambulatory and ADL assistance. Patient son reports he has been in contact with Rep. Tammy at the facility, and prior to the patient hospitalization they were trying to transfer the patient to a SNF.  CSW explain to the patient the SNF process and will follow up with bed offers.   CSW complete FL2/ PASRR level II.    Expected Discharge Plan: Skilled Nursing Facility Barriers to Discharge: Awaiting State Approval Cherlyn Roberts(PASRR)   Patient Goals and CMS Choice   CMS Medicare.gov Compare Post Acute Care list provided to:: Patient Represenative (must comment)(Son)    Expected Discharge Plan and Services Expected Discharge Plan: Skilled Nursing Facility In-house Referral: Clinical Social Work     Living arrangements for the past 2 months: (Memory Care) Expected Discharge Date: 01/05/19                                    Prior Living Arrangements/Services Living arrangements for the past 2 months: (Memory Care) Lives with:: Facility Resident Patient language and need for interpreter reviewed:: No Do you feel safe going back to the place where you live?: Yes      Need for Family Participation in Patient Care: Yes (Comment) Care giver support system in place?: Yes (comment) Current home services: Home OT, Home PT    Activities of Daily Living Home Assistive Devices/Equipment: Wheelchair ADL Screening (condition at time of admission) Patient's cognitive ability adequate to safely  complete daily activities?: No Is the patient deaf or have difficulty hearing?: No Does the patient have difficulty seeing, even when wearing glasses/contacts?: No Does the patient have difficulty concentrating, remembering, or making decisions?: Yes Patient able to express need for assistance with ADLs?: No Does the patient have difficulty dressing or bathing?: Yes Independently performs ADLs?: No Communication: Independent Dressing (OT): Dependent Is this a change from baseline?: Change from baseline, expected to last <3days Grooming: Dependent Is this a change from baseline?: Change from baseline, expected to last <3 days Feeding: Dependent Is this a change from baseline?: Change from baseline, expected to last <3 days Bathing: Dependent Is this a change from baseline?: Change from baseline, expected to last <3 days Toileting: Dependent Is this a change from baseline?: Change from baseline, expected to last <3 days In/Out Bed: Dependent Is this a change from baseline?: Pre-admission baseline Walks in Home: Needs assistance Is this a change from baseline?: Pre-admission baseline Does the patient have difficulty walking or climbing stairs?: Yes Weakness of Legs: Both Weakness of Arms/Hands: Both  Permission Sought/Granted Permission sought to share information with : Case Manager, Family Supports       Permission granted to share info w AGENCY: SNF's in area  Permission granted to share info w Relationship: Son  Permission granted to share info w Contact Information: [phone]  Emotional Assessment Appearance:: Appears stated age Attitude/Demeanor/Rapport: Unable to Assess  Affect (typically observed): Unable to Assess Orientation: : (Responds to voice) Alcohol / Substance Use: Not Applicable Psych Involvement: No (comment)  Admission diagnosis:  Pain [R52] Hypothermia, initial encounter [T68.XXXA] Fall, initial encounter [id number]] Patient Active Problem List    Diagnosis Date Noted  . Acute lower UTI 01/03/2019  . Hypothermia 01/03/2019  . Bradycardia 01/03/2019  . Diabetes ([place]) 01/03/2019  . Dementia ([place]) 01/03/2019  . History of sinus bradycardia 07/22/2018  . DIVERTICULITIS, HX OF 03/26/2010   PCP:  Octavio Graves, DO (Inactive) Pharmacy:   [facility] [address] [place] [address] Phone: [phone] Fax: [phone]     Social Determinants of Health (SDOH) Interventions    Readmission Risk Interventions No flowsheet data found.

## 2019-01-07 NOTE — Progress Notes (Signed)
PROGRESS NOTE    Secundino GingerDorretta S Unterreiner  ZOX:[id number] DOB: October 14, 1945 DOA: 01/02/2019 PCP: Samuel JesterButler, Cynthia, DO (Inactive)    Brief Narrative:  73 y.o. female, w dementia, apparently has witnessed fall out of wheel chair, laceration on the upper lip and presents to ER for evaluation. In the ED, pt was found to be hypothermic and bradycardic  Assessment & Plan:   Principal Problem:   Hypothermia Active Problems:   Acute lower UTI   Bradycardia   Diabetes (HCC)   Dementia (HCC)   Hypothermia, sepsis ruled out No osborne j wave on ekg was noted Blood culture x2 noted to be negative thus far Had stopped Depakote at time of presentation as it can cause hypothermia Had also stopped Haldol since it too can cause hypothermia Hypothermia since resolved, stable at this time  Acute lower uti without sepsis Blood culture x2 pending, neg thus far Urine culture pending, thus far no growth Completed 4 days of rocephin Afebrile this AM  Bradycardia (chronic), asymptomatic Continued on Tele Trop neg thus far NOTE Namenda, Lexapro, Depakote can cause bradycardia Bradycardic this AM, asymptomatic. Held lexapro and namenda Valium may also cause bradycardia. Cont on PRN only HR now in the 70's  Dm2 - Will order SSI coverage - Glucose currently stable  Hyperlipidemia -Cont Simvastatin 40mg  po qhs as tolerated -Currently stable  Dementia Namenda XR 28mg  po qday held per above Lexapro 10mg  po qday held per above Valium only PRN per above Seems stable at this time Seen by PT/OT. Discussed with SW. Plan for SNF  DVT prophylaxis: Lovenox subQ Code Status: Full Family Communication: Pt in room, family not at bedside Disposition Plan: SNF possible in 2-3 days  Consultants:     Procedures:     Antimicrobials: Anti-infectives (From admission, onward)   Start     Dose/Rate Route Frequency Ordered Stop   01/03/19 1000  cefTRIAXone (ROCEPHIN) 1 g in sodium chloride 0.9 % 100  mL IVPB  Status:  Discontinued     1 g 200 mL/hr over 30 Minutes Intravenous Every 24 hours 01/03/19 0247 01/03/19 0713   01/03/19 1000  cefTRIAXone (ROCEPHIN) 2 g in sodium chloride 0.9 % 100 mL IVPB     2 g 200 mL/hr over 30 Minutes Intravenous Every 24 hours 01/03/19 0713     01/03/19 0030  vancomycin (VANCOCIN) 2,000 mg in sodium chloride 0.9 % 500 mL IVPB     2,000 mg 250 mL/hr over 120 Minutes Intravenous  Once 01/03/19 0020 01/03/19 0353   01/03/19 0015  ceFEPIme (MAXIPIME) 2 g in sodium chloride 0.9 % 100 mL IVPB     2 g 200 mL/hr over 30 Minutes Intravenous  Once 01/03/19 0005 01/03/19 0137   01/03/19 0015  metroNIDAZOLE (FLAGYL) IVPB 500 mg     500 mg 100 mL/hr over 60 Minutes Intravenous  Once 01/03/19 0005 01/03/19 0213   01/03/19 0015  vancomycin (VANCOCIN) IVPB 1000 mg/200 mL premix  Status:  Discontinued     1,000 mg 200 mL/hr over 60 Minutes Intravenous  Once 01/03/19 0005 01/03/19 0020      Subjective: Without complaints this AM  Objective: Vitals:   01/06/19 1405 01/06/19 2204 01/07/19 0549 01/07/19 1447  BP: (!) 153/54 (!) 147/133 (!) 169/65 (!) 145/71  Pulse: (!) 41 61 60 73  Resp: 16 18 20 20   Temp: (!) 97.5 F (36.4 C) 97.8 F (36.6 C) 98 F (36.7 C) (!) 97.5 F (36.4 C)  TempSrc: Oral Oral Oral Oral  SpO2: 96% 98% 98% 91%  Weight:   108.7 kg   Height:        Intake/Output Summary (Last 24 hours) at 01/07/2019 1600 Last data filed at 01/07/2019 1440 Gross per 24 hour  Intake 3349.5 ml  Output 3200 ml  Net 149.5 ml   Filed Weights   01/05/19 0447 01/06/19 0643 01/07/19 0549  Weight: 111.8 kg 107.9 kg 108.7 kg    Examination: General exam: Conversant, in no acute distress Respiratory system: normal chest rise, clear, no audible wheezing Cardiovascular system: regular rhythm, s1-s2 Gastrointestinal system: Nondistended, nontender, pos BS Central nervous system: No seizures, no tremors Extremities: No cyanosis, no joint deformities Skin: No  rashes, no pallor Psychiatry: difficult to assess given underlying dementia  Data Reviewed: I have personally reviewed following labs and imaging studies  CBC: Recent Labs  Lab 01/03/19 0005 01/03/19 0436 01/04/19 0254 01/05/19 0403 01/06/19 0423 01/07/19 0746  WBC 6.4 6.0 5.6 5.9 6.3 6.2  NEUTROABS 4.0  --  2.9 3.2 3.8 4.0  HGB 13.5 11.1* 11.3* 11.1* 11.3* 11.9*  HCT 45.2 37.5 38.4 36.7 38.0 39.1  MCV 97.2 95.7 98.7 97.1 97.2 97.3  PLT 172 136* 151 136* 138* 165   Basic Metabolic Panel: Recent Labs  Lab 01/03/19 0436 01/04/19 0254 01/05/19 0403 01/06/19 0423 01/07/19 0746  NA 142 142 138 142 140  K 4.4 3.5 3.5 3.4* 3.5  CL 106 109 104 105 104  CO2 27 26 27 27 28   GLUCOSE 121* 96 100* 115* 115*  BUN 17 10 9 10 9   CREATININE 0.61 0.50 0.54 0.58 0.57  CALCIUM 9.6 9.4 9.3 9.6 9.7  MG  --  1.9  --   --  2.0   GFR: Estimated Creatinine Clearance: 75.9 mL/min (by C-G formula based on SCr of 0.57 mg/dL). Liver Function Tests: Recent Labs  Lab 01/03/19 0436 01/04/19 0254 01/05/19 0403 01/06/19 0423 01/07/19 0746  AST 23 17 19 22 21   ALT 17 16 18 20 22   ALKPHOS 60 57 63 68 73  BILITOT 1.0 0.6 0.6 0.5 0.5  PROT 5.3* 5.4* 5.4* 5.5* 5.8*  ALBUMIN 2.7* 2.6* 2.7* 2.6* 2.8*   No results for input(s): LIPASE, AMYLASE in the last 168 hours. No results for input(s): AMMONIA in the last 168 hours. Coagulation Profile: Recent Labs  Lab 01/03/19 0005  INR 0.9   Cardiac Enzymes: Recent Labs  Lab 01/03/19 0005  CKTOTAL 148  CKMB 10.9*   BNP (last 3 results) No results for input(s): PROBNP in the last 8760 hours. HbA1C: No results for input(s): HGBA1C in the last 72 hours. CBG: Recent Labs  Lab 01/06/19 1157 01/06/19 1710 01/06/19 2207 01/07/19 0724 01/07/19 1145  GLUCAP 99 151* 103* 104* 151*   Lipid Profile: No results for input(s): CHOL, HDL, LDLCALC, TRIG, CHOLHDL, LDLDIRECT in the last 72 hours. Thyroid Function Tests: No results for input(s): TSH,  T4TOTAL, FREET4, T3FREE, THYROIDAB in the last 72 hours. Anemia Panel: No results for input(s): VITAMINB12, FOLATE, FERRITIN, TIBC, IRON, RETICCTPCT in the last 72 hours. Sepsis Labs: Recent Labs  Lab 01/03/19 0005 01/03/19 0224  LATICACIDVEN 1.1 0.9    Recent Results (from the past 240 hour(s))  Blood Culture (routine x 2)     Status: None (Preliminary result)   Collection Time: 01/03/19 12:05 AM   Specimen: BLOOD LEFT HAND  Result Value Ref Range Status   Specimen Description   Final    BLOOD LEFT HAND Performed at [facility], [address] [place], [place] [address]    Special Requests   Final    BOTTLES DRAWN AEROBIC AND ANAEROBIC Blood Culture adequate volume Performed at [facility], [address] [place], [place] [address]    Culture   Final    NO GROWTH 4 DAYS Performed at [facility], [address] [place], [place] [address]    Report Status PENDING  Incomplete  Urine culture     Status: None   Collection Time: 01/03/19 12:05 AM   Specimen: In/Out Cath Urine  Result Value Ref Range Status   Specimen Description   Final    IN/OUT CATH URINE Performed at [facility], [address] [place], [place] [address]    Special Requests   Final    NONE Performed at [facility], [address] [place], [place] [address]    Culture   Final    NO GROWTH Performed at [facility], [address] [place], [place] [address]    Report Status 01/04/2019 FINAL  Final  SARS Coronavirus 2 (CEPHEID - Performed in [facility]), Hosp Order     Status: None   Collection Time: 01/03/19 12:06 AM   Specimen: Nasopharyngeal Swab  Result Value Ref Range Status   SARS Coronavirus 2 NEGATIVE NEGATIVE Final    Comment: (NOTE) If result is NEGATIVE SARS-CoV-2 target nucleic acids are NOT DETECTED. The SARS-CoV-2 RNA is generally detectable in upper and lower  respiratory specimens  during the acute phase of infection. The lowest  concentration of SARS-CoV-2 viral copies this assay can detect is 250  copies / mL. A negative result does not preclude SARS-CoV-2 infection  and should not be used as the sole basis for treatment or other  patient management decisions.  A negative result may occur with  improper specimen collection / handling, submission of specimen other  than nasopharyngeal swab, presence of viral mutation(s) within the  areas targeted by this assay, and inadequate number of viral copies  (<250 copies / mL). A negative result must be combined with clinical  observations, patient history, and epidemiological information. If result is POSITIVE SARS-CoV-2 target nucleic acids are DETECTED. The SARS-CoV-2 RNA is generally detectable in upper and lower  respiratory specimens dur ing the acute phase of infection.  Positive  results are indicative of active infection with SARS-CoV-2.  Clinical  correlation with patient history and other diagnostic information is  necessary to determine patient infection status.  Positive results do  not rule out bacterial infection or co-infection with other viruses. If result is PRESUMPTIVE POSTIVE SARS-CoV-2 nucleic acids MAY BE PRESENT.   A presumptive positive result was obtained on the submitted specimen  and confirmed on repeat testing.  While 2019 novel coronavirus  (SARS-CoV-2) nucleic acids may be present in the submitted sample  additional confirmatory testing may be necessary for epidemiological  and / or clinical management purposes  to differentiate between  SARS-CoV-2 and other Sarbecovirus currently known to infect humans.  If clinically indicated additional testing with an alternate test  methodology [phone]([id number]) is advised. The SARS-CoV-2 RNA is generally  detectable in upper and lower respiratory sp ecimens during the acute  phase of infection. The expected result is Negative. Fact Sheet for Patients:   BoilerBrush.com.cyhttps://www.fda.gov/media/136312/download Fact Sheet for Healthcare Providers: https://pope.com/https://www.fda.gov/media/136313/download This test is not yet approved or cleared by the [place] FDA and has been authorized for detection and/or diagnosis of  SARS-CoV-2 by FDA under an Emergency Use Authorization (EUA).  This EUA will remain in effect (meaning this test can be used) for the duration of the COVID-19 declaration under Section 564(b)(1) of the Act, 21 U.S.C. section 360bbb-3(b)(1), unless the authorization is terminated or revoked sooner. Performed at [facility], [place] [address] [place], [place] [address]   Blood Culture (routine x 2)     Status: None (Preliminary result)   Collection Time: 01/03/19 12:58 AM   Specimen: BLOOD RIGHT FOREARM  Result Value Ref Range Status   Specimen Description   Final    BLOOD RIGHT FOREARM Performed at [facility], [place] [address] [place], [place] [address]    Special Requests   Final    BOTTLES DRAWN AEROBIC AND ANAEROBIC Blood Culture adequate volume Performed at [place] [address] [place], [place] [address]    Culture   Final    NO GROWTH 4 DAYS Performed at [facility], [place] [address], [place], [place] [address]    Report Status PENDING  Incomplete  MRSA PCR Screening     Status: None   Collection Time: 01/03/19  3:33 AM   Specimen: Nasopharyngeal  Result Value Ref Range Status   MRSA by PCR NEGATIVE NEGATIVE Final    Comment:        The GeneXpert MRSA Assay (FDA approved for NASAL specimens only), is one component of a comprehensive MRSA colonization surveillance program. It is not intended to diagnose MRSA infection nor to guide or monitor treatment for MRSA infections. Performed at [facility], [place] [address], [place], [place] [address]      Radiology Studies: No results found.  Scheduled Meds: . enoxaparin (LOVENOX) injection  40 mg  Subcutaneous Q24H  . feeding supplement (ENSURE ENLIVE)  237 mL Oral BID BM  . furosemide  40 mg Oral QODAY  . insulin aspart  0-15 Units Subcutaneous TID WC  . insulin aspart  0-5 Units Subcutaneous QHS  . ketoconazole   Topical BID  . mouth rinse  15 mL Mouth Rinse BID  . multivitamin with minerals  1 tablet Oral Daily  . potassium chloride SA  20 mEq Oral Daily  . potassium chloride  40 mEq Oral Once  . simvastatin  40 mg Oral Daily   Continuous Infusions: . sodium chloride 70 mL/hr at 01/07/19 1201  . cefTRIAXone (ROCEPHIN)  IV 2 g (01/07/19 0958)     LOS: 4 days   Marylu Lund, MD [facility] Pager On Amion  If 7PM-7AM, please contact night-coverage 01/07/2019, 4:00 PM

## 2019-01-08 LAB — GLUCOSE, CAPILLARY
Glucose-Capillary: 109 mg/dL — ABNORMAL HIGH (ref 70–99)
Glucose-Capillary: 131 mg/dL — ABNORMAL HIGH (ref 70–99)
Glucose-Capillary: 79 mg/dL (ref 70–99)
Glucose-Capillary: 107 mg/dL — ABNORMAL HIGH (ref 70–99)

## 2019-01-08 LAB — CULTURE, BLOOD (ROUTINE X 2)
Culture: NO GROWTH
Culture: NO GROWTH
Special Requests: ADEQUATE
Special Requests: ADEQUATE

## 2019-01-08 LAB — CBC WITH DIFFERENTIAL/PLATELET
Abs Immature Granulocytes: 0.04 10*3/uL (ref 0.00–0.07)
Basophils Absolute: 0 10*3/uL (ref 0.0–0.1)
Basophils Relative: 1 %
Eosinophils Absolute: 0.2 10*3/uL (ref 0.0–0.5)
Eosinophils Relative: 3 %
HCT: 40.1 % (ref 36.0–46.0)
Hemoglobin: 11.7 g/dL — ABNORMAL LOW (ref 12.0–15.0)
Immature Granulocytes: 1 %
Lymphocytes Relative: 36 %
Lymphs Abs: 2.1 10*3/uL (ref 0.7–4.0)
MCH: 28.2 pg (ref 26.0–34.0)
MCHC: 29.2 g/dL — ABNORMAL LOW (ref 30.0–36.0)
MCV: 96.6 fL (ref 80.0–100.0)
Monocytes Absolute: 0.6 10*3/uL (ref 0.1–1.0)
Monocytes Relative: 10 %
Neutro Abs: 3 10*3/uL (ref 1.7–7.7)
Neutrophils Relative %: 49 %
Platelets: 180 10*3/uL (ref 150–400)
RBC: 4.15 MIL/uL (ref 3.87–5.11)
RDW: 17.2 % — ABNORMAL HIGH (ref 11.5–15.5)
WBC: 5.9 10*3/uL (ref 4.0–10.5)
nRBC: 0 % (ref 0.0–0.2)

## 2019-01-08 LAB — COMPREHENSIVE METABOLIC PANEL
ALT: 19 U/L (ref 0–44)
AST: 17 U/L (ref 15–41)
Albumin: 2.8 g/dL — ABNORMAL LOW (ref 3.5–5.0)
Alkaline Phosphatase: 69 U/L (ref 38–126)
Anion gap: 7 (ref 5–15)
BUN: 13 mg/dL (ref 8–23)
CO2: 29 mmol/L (ref 22–32)
Calcium: 9.8 mg/dL (ref 8.9–10.3)
Chloride: 105 mmol/L (ref 98–111)
Creatinine, Ser: 0.53 mg/dL (ref 0.44–1.00)
GFR calc Af Amer: >60 mL/min (ref >60)
GFR calc non Af Amer: >60 mL/min (ref >60)
Glucose, Bld: 115 mg/dL — ABNORMAL HIGH (ref 70–99)
Potassium: 3.6 mmol/L (ref 3.5–5.1)
Sodium: 141 mmol/L (ref 135–145)
Total Bilirubin: 0.3 mg/dL (ref 0.3–1.2)
Total Protein: 5.7 g/dL — ABNORMAL LOW (ref 6.5–8.1)

## 2019-01-08 MED ORDER — HYDRALAZINE HCL 20 MG/ML IJ SOLN: 5.0000 mg | INTRAMUSCULAR | Status: DC | PRN

## 2019-01-08 MED ORDER — LISINOPRIL 10 MG PO TABS
10.0000 mg | ORAL_TABLET | Freq: Every day | ORAL | Status: DC
  Administered 2019-01-08 – 2019-01-13 (×6): 10 mg via ORAL
  Filled 2019-01-08 (×6): qty 1

## 2019-01-08 NOTE — Progress Notes (Signed)
PROGRESS NOTE    Penny Carr  ZOX:[id number] DOB: 1945/06/27 DOA: 01/02/2019 PCP: Samuel JesterButler, Cynthia, DO (Inactive)    Brief Narrative:  73 y.o. female, w dementia, apparently has witnessed fall out of wheel chair, laceration on the upper lip and presents to ER for evaluation. In the ED, pt was found to be hypothermic and bradycardic  Assessment & Plan:   Principal Problem:   Hypothermia Active Problems:   Acute lower UTI   Bradycardia   Diabetes (HCC)   Dementia (HCC)  Hypothermia, sepsis ruled out No osborne j wave on ekg was noted Blood culture x2 noted to be negative thus far Had stopped Depakote at time of presentation as it can cause hypothermia Had also stopped Haldol since it too can cause hypothermia Hypothermia since resolved, remains stable at this time  Acute lower uti without sepsis Blood culture x2 pending, neg thus far Urine culture pending,no growth noted Completed 4 days of rocephin Remains afebrile this AM  Bradycardia (chronic), asymptomatic Continued on Tele Trop neg thus far NOTE Namenda, Lexapro, Depakote can cause bradycardia Bradycardic this AM, asymptomatic. Held lexapro and namenda Valium may also cause bradycardia. Cont on PRN only HR currently in the 70's  Dm2 - Will order SSI coverage - Glucose presently stable  Hyperlipidemia -Cont Simvastatin 40mg  po qhs as tolerated -Presently stable  Dementia Namenda XR 28mg  po qday held per above Lexapro 10mg  po qday held per above Valium only PRN per above Seems stable at this time Seen by PT/OT. Discussed with SW. Plan for SNF  DVT prophylaxis: Lovenox subQ Code Status: Full Family Communication: Pt in room, family not at bedside Disposition Plan: SNF possible in 1-2 days  Consultants:     Procedures:     Antimicrobials: Anti-infectives (From admission, onward)   Start     Dose/Rate Route Frequency Ordered Stop   01/03/19 1000  cefTRIAXone (ROCEPHIN) 1 g in sodium  chloride 0.9 % 100 mL IVPB  Status:  Discontinued     1 g 200 mL/hr over 30 Minutes Intravenous Every 24 hours 01/03/19 0247 01/03/19 0713   01/03/19 1000  cefTRIAXone (ROCEPHIN) 2 g in sodium chloride 0.9 % 100 mL IVPB  Status:  Discontinued     2 g 200 mL/hr over 30 Minutes Intravenous Every 24 hours 01/03/19 0713 01/07/19 1602   01/03/19 0030  vancomycin (VANCOCIN) 2,000 mg in sodium chloride 0.9 % 500 mL IVPB     2,000 mg 250 mL/hr over 120 Minutes Intravenous  Once 01/03/19 0020 01/03/19 0353   01/03/19 0015  ceFEPIme (MAXIPIME) 2 g in sodium chloride 0.9 % 100 mL IVPB     2 g 200 mL/hr over 30 Minutes Intravenous  Once 01/03/19 0005 01/03/19 0137   01/03/19 0015  metroNIDAZOLE (FLAGYL) IVPB 500 mg     500 mg 100 mL/hr over 60 Minutes Intravenous  Once 01/03/19 0005 01/03/19 0213   01/03/19 0015  vancomycin (VANCOCIN) IVPB 1000 mg/200 mL premix  Status:  Discontinued     1,000 mg 200 mL/hr over 60 Minutes Intravenous  Once 01/03/19 0005 01/03/19 0020      Subjective: No complaints this AM.   Objective: Vitals:   01/07/19 1447 01/07/19 2101 01/08/19 0521 01/08/19 1415  BP: (!) 145/71 (!) 152/83 (!) 138/53 (!) 151/63  Pulse: 73 (!) 51 (!) 42 73  Resp: 20 18 18 16   Temp: (!) 97.5 F (36.4 C) 97.6 F (36.4 C) 98.2 F (36.8 C) 97.8 F (36.6 C)  TempSrc:  Oral Oral  Oral  SpO2: 91% 99% 96% 98%  Weight:   108.6 kg   Height:        Intake/Output Summary (Last 24 hours) at 01/08/2019 1438 Last data filed at 01/08/2019 1416 Gross per 24 hour  Intake 1688.91 ml  Output 1050 ml  Net 638.91 ml   Filed Weights   01/06/19 0643 01/07/19 0549 01/08/19 0521  Weight: 107.9 kg 108.7 kg 108.6 kg    Examination: General exam: Awake, laying in bed, in nad Respiratory system: Normal respiratory effort, no wheezing Cardiovascular system: regular rate, s1, s2 Gastrointestinal system: Soft, nondistended, positive BS Central nervous system: CN2-12 grossly intact, strength intact  Extremities: Perfused, no clubbing Skin: Normal skin turgor, no notable skin lesions seen Psychiatry: Mood normal // no visual hallucinations   Data Reviewed: I have personally reviewed following labs and imaging studies  CBC: Recent Labs  Lab 01/04/19 0254 01/05/19 0403 01/06/19 0423 01/07/19 0746 01/08/19 0530  WBC 5.6 5.9 6.3 6.2 5.9  NEUTROABS 2.9 3.2 3.8 4.0 3.0  HGB 11.3* 11.1* 11.3* 11.9* 11.7*  HCT 38.4 36.7 38.0 39.1 40.1  MCV 98.7 97.1 97.2 97.3 96.6  PLT 151 136* 138* 165 180   Basic Metabolic Panel: Recent Labs  Lab 01/04/19 0254 01/05/19 0403 01/06/19 0423 01/07/19 0746 01/08/19 0530  NA 142 138 142 140 141  K 3.5 3.5 3.4* 3.5 3.6  CL 109 104 105 104 105  CO2 26 27 27 28 29   GLUCOSE 96 100* 115* 115* 115*  BUN 10 9 10 9 13   CREATININE 0.50 0.54 0.58 0.57 0.53  CALCIUM 9.4 9.3 9.6 9.7 9.8  MG 1.9  --   --  2.0  --    GFR: Estimated Creatinine Clearance: 75.8 mL/min (by C-G formula based on SCr of 0.53 mg/dL). Liver Function Tests: Recent Labs  Lab 01/04/19 0254 01/05/19 0403 01/06/19 0423 01/07/19 0746 01/08/19 0530  AST 17 19 22 21 17   ALT 16 18 20 22 19   ALKPHOS 57 63 68 73 69  BILITOT 0.6 0.6 0.5 0.5 0.3  PROT 5.4* 5.4* 5.5* 5.8* 5.7*  ALBUMIN 2.6* 2.7* 2.6* 2.8* 2.8*   No results for input(s): LIPASE, AMYLASE in the last 168 hours. No results for input(s): AMMONIA in the last 168 hours. Coagulation Profile: Recent Labs  Lab 01/03/19 0005  INR 0.9   Cardiac Enzymes: Recent Labs  Lab 01/03/19 0005  CKTOTAL 148  CKMB 10.9*   BNP (last 3 results) No results for input(s): PROBNP in the last 8760 hours. HbA1C: No results for input(s): HGBA1C in the last 72 hours. CBG: Recent Labs  Lab 01/07/19 1145 01/07/19 1654 01/07/19 2057 01/08/19 0742 01/08/19 1241  GLUCAP 151* 115* 98 109* 131*   Lipid Profile: No results for input(s): CHOL, HDL, LDLCALC, TRIG, CHOLHDL, LDLDIRECT in the last 72 hours. Thyroid Function Tests: No  results for input(s): TSH, T4TOTAL, FREET4, T3FREE, THYROIDAB in the last 72 hours. Anemia Panel: No results for input(s): VITAMINB12, FOLATE, FERRITIN, TIBC, IRON, RETICCTPCT in the last 72 hours. Sepsis Labs: Recent Labs  Lab 01/03/19 0005 01/03/19 0224  LATICACIDVEN 1.1 0.9    Recent Results (from the past 240 hour(s))  Blood Culture (routine x 2)     Status: None   Collection Time: 01/03/19 12:05 AM   Specimen: BLOOD LEFT HAND  Result Value Ref Range Status   Specimen Description   Final    BLOOD LEFT HAND Performed at [facility], [address] [place], [place] [address]    Special Requests   Final    BOTTLES DRAWN AEROBIC AND ANAEROBIC Blood Culture adequate volume Performed at [facility], [address] [place], [place] [address]    Culture   Final    NO GROWTH 5 DAYS Performed at [facility], [address] [place], [place] [address]    Report Status 01/08/2019 FINAL  Final  Urine culture     Status: None   Collection Time: 01/03/19 12:05 AM   Specimen: In/Out Cath Urine  Result Value Ref Range Status   Specimen Description   Final    IN/OUT CATH URINE Performed at [facility], [address] [place], [place] [address]    Special Requests   Final    NONE Performed at [facility], [address] [place], [place] [address]    Culture   Final    NO GROWTH Performed at [facility], [address] [place], [place] [address]    Report Status 01/04/2019 FINAL  Final  SARS Coronavirus 2 (CEPHEID - Performed in [facility] hospital lab), Hosp Order     Status: None   Collection Time: 01/03/19 12:06 AM   Specimen: Nasopharyngeal Swab  Result Value Ref Range Status   SARS Coronavirus 2 NEGATIVE NEGATIVE Final    Comment: (NOTE) If result is NEGATIVE SARS-CoV-2 target nucleic acids are NOT DETECTED. The SARS-CoV-2 RNA is generally detectable in upper and lower  respiratory  specimens during the acute phase of infection. The lowest  concentration of SARS-CoV-2 viral copies this assay can detect is 250  copies / mL. A negative result does not preclude SARS-CoV-2 infection  and should not be used as the sole basis for treatment or other  patient management decisions.  A negative result may occur with  improper specimen collection / handling, submission of specimen other  than nasopharyngeal swab, presence of viral mutation(s) within the  areas targeted by this assay, and inadequate number of viral copies  (<250 copies / mL). A negative result must be combined with clinical  observations, patient history, and epidemiological information. If result is POSITIVE SARS-CoV-2 target nucleic acids are DETECTED. The SARS-CoV-2 RNA is generally detectable in upper and lower  respiratory specimens dur ing the acute phase of infection.  Positive  results are indicative of active infection with SARS-CoV-2.  Clinical  correlation with patient history and other diagnostic information is  necessary to determine patient infection status.  Positive results do  not rule out bacterial infection or co-infection with other viruses. If result is PRESUMPTIVE POSTIVE SARS-CoV-2 nucleic acids MAY BE PRESENT.   A presumptive positive result was obtained on the submitted specimen  and confirmed on repeat testing.  While 2019 novel coronavirus  (SARS-CoV-2) nucleic acids may be present in the submitted sample  additional confirmatory testing may be necessary for epidemiological  and / or clinical management purposes  to differentiate between  SARS-CoV-2 and other Sarbecovirus currently known to infect humans.  If clinically indicated additional testing with an alternate test  methodology [phone]([id number]) is advised. The SARS-CoV-2 RNA is generally  detectable in upper and lower respiratory sp ecimens during the acute  phase of infection. The expected result is Negative. Fact Sheet for  Patients:  BoilerBrush.com.cyhttps://www.fda.gov/media/136312/download Fact Sheet for Healthcare Providers: https://pope.com/https://www.fda.gov/media/136313/download This test is not yet approved or cleared by the [place] FDA and has been authorized for detection and/or diagnosis of SARS-CoV-2 by  FDA under an Emergency Use Authorization (EUA).  This EUA will remain in effect (meaning this test can be used) for the duration of the COVID-19 declaration under Section 564(b)(1) of the Act, 21 U.S.C. section 360bbb-3(b)(1), unless the authorization is terminated or revoked sooner. Performed at [facility], [place] [address] [place], [place] [address]   Blood Culture (routine x 2)     Status: None   Collection Time: 01/03/19 12:58 AM   Specimen: BLOOD RIGHT FOREARM  Result Value Ref Range Status   Specimen Description   Final    BLOOD RIGHT FOREARM Performed at [facility], [place] [address], [place], [place] [address]    Special Requests   Final    BOTTLES DRAWN AEROBIC AND ANAEROBIC Blood Culture adequate volume Performed at [place] [address] [place], [place] [address]    Culture   Final    NO GROWTH 5 DAYS Performed at [facility], [place] [address] [place], [place] [address]    Report Status 01/08/2019 FINAL  Final  MRSA PCR Screening     Status: None   Collection Time: 01/03/19  3:33 AM   Specimen: Nasopharyngeal  Result Value Ref Range Status   MRSA by PCR NEGATIVE NEGATIVE Final    Comment:        The GeneXpert MRSA Assay (FDA approved for NASAL specimens only), is one component of a comprehensive MRSA colonization surveillance program. It is not intended to diagnose MRSA infection nor to guide or monitor treatment for MRSA infections. Performed at [facility], [place] [address], [place],  [address]      Radiology Studies: No results found.  Scheduled Meds: . enoxaparin (LOVENOX) injection  40 mg Subcutaneous  Q24H  . feeding supplement (ENSURE ENLIVE)  237 mL Oral BID BM  . furosemide  40 mg Oral QODAY  . insulin aspart  0-15 Units Subcutaneous TID WC  . insulin aspart  0-5 Units Subcutaneous QHS  . ketoconazole   Topical BID  . lisinopril  10 mg Oral Daily  . mouth rinse  15 mL Mouth Rinse BID  . multivitamin with minerals  1 tablet Oral Daily  . potassium chloride SA  20 mEq Oral Daily  . potassium chloride  40 mEq Oral Once  . simvastatin  40 mg Oral Daily   Continuous Infusions: . sodium chloride 70 mL/hr at 01/08/19 0600     LOS: 5 days   Marylu Lund, MD [facility] Pager On Amion  If 7PM-7AM, please contact night-coverage 01/08/2019, 2:38 PM

## 2019-01-09 LAB — GLUCOSE, CAPILLARY
Glucose-Capillary: 107 mg/dL — ABNORMAL HIGH (ref 70–99)
Glucose-Capillary: 150 mg/dL — ABNORMAL HIGH (ref 70–99)
Glucose-Capillary: 112 mg/dL — ABNORMAL HIGH (ref 70–99)
Glucose-Capillary: 113 mg/dL — ABNORMAL HIGH (ref 70–99)

## 2019-01-09 LAB — BASIC METABOLIC PANEL
Anion gap: 6 (ref 5–15)
BUN: 20 mg/dL (ref 8–23)
CO2: 28 mmol/L (ref 22–32)
Calcium: 9.8 mg/dL (ref 8.9–10.3)
Chloride: 107 mmol/L (ref 98–111)
Creatinine, Ser: 0.64 mg/dL (ref 0.44–1.00)
GFR calc Af Amer: >60 mL/min (ref >60)
GFR calc non Af Amer: >60 mL/min (ref >60)
Glucose, Bld: 112 mg/dL — ABNORMAL HIGH (ref 70–99)
Potassium: 4.2 mmol/L (ref 3.5–5.1)
Sodium: 141 mmol/L (ref 135–145)

## 2019-01-09 NOTE — Care Management Important Message (Signed)
Important Message  Patient Details IM Letter given to [name] RN to present to the Patient Name: Penny Carr MRN: [id number] Date of Birth: [birth year]   Medicare Important Message Given:  Yes     Kerin Salen 01/09/2019, 10:06 AM

## 2019-01-09 NOTE — Progress Notes (Signed)
Occupational Therapy Treatment Patient Details Name: Penny Carr MRN: [id number] DOB: 1945/07/22 Today's Date: 01/09/2019    History of present illness Ivannia Willhelm  is a 73 y.o. female, w dementia, apparently has witnessed fall out of wheel chair, laceration on the upper lip and presents to ER for evaluation. She was found to have bradycardia and hypothermia.   OT comments  Pt with good participation with grooming task with Verbal and tactile cues.  Increased time needed   Follow Up Recommendations  Home health OT;Supervision/Assistance - 24 hour;SNF;Other (comment)(pending how much care ALF can provide)    Equipment Recommendations  None recommended by OT      ADL either performed or assessed with clinical judgement   ADL Overall ADL's : Needs assistance/impaired     Grooming: Wash/dry hands;Wash/dry face;Oral care;Brushing hair;Moderate assistance;Maximal assistance;Bed level Grooming Details (indicate cue type and reason): hand over hand guiding and assist as well as increased time                               General ADL Comments: OT session this day focused on grooming.  Pt needed significant A and increased time               Cognition Arousal/Alertness: Awake/alert Behavior During Therapy: Flat affect Overall Cognitive Status: History of cognitive impairments - at baseline Area of Impairment: Orientation;Memory;Attention;Following commands;Problem solving                 Orientation Level: Person Current Attention Level: Sustained Memory: Decreased short-term memory Following Commands: Follows one step commands inconsistently;Follows one step commands with increased time     Problem Solving: Difficulty sequencing;Requires verbal cues;Slow processing;Decreased initiation;Requires tactile cues General Comments: hx of dementia                   Pertinent Vitals/ Pain       Pain Assessment: No/denies pain     Prior  Functioning/Environment              Frequency  Min 2X/week        Progress Toward Goals  OT Goals(current goals can now be found in the care plan section)  Progress towards OT goals: Progressing toward goals     Plan Discharge plan remains appropriate       AM-PAC OT "6 Clicks" Daily Activity     Outcome Measure   Help from another person eating meals?: A Lot Help from another person taking care of personal grooming?: A Lot Help from another person toileting, which includes using toliet, bedpan, or urinal?: A Lot Help from another person bathing (including washing, rinsing, drying)?: Total Help from another person to put on and taking off regular upper body clothing?: A Lot Help from another person to put on and taking off regular lower body clothing?: Total 6 Click Score: 10    End of Session Equipment Utilized During Treatment: (stedy)  OT Visit Diagnosis: Other abnormalities of gait and mobility (R26.89);Muscle weakness (generalized) (M62.81);History of falling (Z91.81);Other symptoms and signs involving cognitive function   Activity Tolerance Patient tolerated treatment well   Patient Left with call bell/phone within reach;in bed;with bed alarm set   Nurse Communication Mobility status;Need for lift equipment        Time: 1455-1510 OT Time Calculation (min): 15 min  Charges: OT General Charges $OT Visit: 1 Visit OT Treatments $Self Care/Home Management : 8-22 mins  Kari Baars, OT  Acute Rehabilitation Services [phone] Office- [phone]      Cerena Baine, [place] Kung[place]raine D 01/09/2019, 3:50 PM

## 2019-01-09 NOTE — Progress Notes (Signed)
PROGRESS NOTE    Secundino GingerDorretta S Wisehart  ZOX:[id number] DOB: 1945-11-04 DOA: 01/02/2019 PCP: Samuel JesterButler, Cynthia, DO (Inactive)    Brief Narrative:  73 y.o. female, w dementia, apparently has witnessed fall out of wheel chair, laceration on the upper lip and presents to ER for evaluation. In the ED, pt was found to be hypothermic and bradycardic  Assessment & Plan:   Principal Problem:   Hypothermia Active Problems:   Acute lower UTI   Bradycardia   Diabetes (HCC)   Dementia (HCC)  Hypothermia, sepsis ruled out No osborne j wave on ekg was noted Blood culture x2 noted to be negative thus far Had stopped Depakote at time of presentation as it can cause hypothermia Had also stopped Haldol since it too can cause hypothermia Hypothermia since resolved, currently stable  Acute lower uti without sepsis Blood culture x2 neg thus far Urine culture pending,no growth noted Completed 4 days of rocephin Pt afebrile this AM  Bradycardia (chronic), asymptomatic Continued on Tele Trop neg thus far NOTE Namenda, Lexapro, Depakote can cause bradycardia Bradycardic this AM, asymptomatic. Held lexapro and namenda Valium may also cause bradycardia. Cont on PRN only HR improved, now in the 70's.  Dm2 - Continue with SSI coverage - Glucose presently stable  Hyperlipidemia -Cont Simvastatin 40mg  po qhs as tolerated -Currently stable  Dementia Namenda XR 28mg  po qday held per above Lexapro 10mg  po qday held per above Valium only PRN per above Mentation improved since presentation Seen by PT/OT. Discussed with SW. Plan for SNF  DVT prophylaxis: Lovenox subQ Code Status: Full Family Communication: Pt in room, family not at bedside Disposition Plan: SNF possible in 1-2 days  Consultants:     Procedures:     Antimicrobials: Anti-infectives (From admission, onward)   Start     Dose/Rate Route Frequency Ordered Stop   01/03/19 1000  cefTRIAXone (ROCEPHIN) 1 g in sodium chloride  0.9 % 100 mL IVPB  Status:  Discontinued     1 g 200 mL/hr over 30 Minutes Intravenous Every 24 hours 01/03/19 0247 01/03/19 0713   01/03/19 1000  cefTRIAXone (ROCEPHIN) 2 g in sodium chloride 0.9 % 100 mL IVPB  Status:  Discontinued     2 g 200 mL/hr over 30 Minutes Intravenous Every 24 hours 01/03/19 0713 01/07/19 1602   01/03/19 0030  vancomycin (VANCOCIN) 2,000 mg in sodium chloride 0.9 % 500 mL IVPB     2,000 mg 250 mL/hr over 120 Minutes Intravenous  Once 01/03/19 0020 01/03/19 0353   01/03/19 0015  ceFEPIme (MAXIPIME) 2 g in sodium chloride 0.9 % 100 mL IVPB     2 g 200 mL/hr over 30 Minutes Intravenous  Once 01/03/19 0005 01/03/19 0137   01/03/19 0015  metroNIDAZOLE (FLAGYL) IVPB 500 mg     500 mg 100 mL/hr over 60 Minutes Intravenous  Once 01/03/19 0005 01/03/19 0213   01/03/19 0015  vancomycin (VANCOCIN) IVPB 1000 mg/200 mL premix  Status:  Discontinued     1,000 mg 200 mL/hr over 60 Minutes Intravenous  Once 01/03/19 0005 01/03/19 0020      Subjective: Without complaints this AM  Objective: Vitals:   01/09/19 0500 01/09/19 0514 01/09/19 1226 01/09/19 1418  BP:  (!) 107/40 (!) 133/49 135/63  Pulse:  60  75  Resp:  18  18  Temp:  98 F (36.7 C)  97.8 F (36.6 C)  TempSrc:    Oral  SpO2:  93%  96%  Weight: 108.7 kg  Height:        Intake/Output Summary (Last 24 hours) at 01/09/2019 1528 Last data filed at 01/09/2019 1300 Gross per 24 hour  Intake 1841.63 ml  Output 1050 ml  Net 791.63 ml   Filed Weights   01/07/19 0549 01/08/19 0521 01/09/19 0500  Weight: 108.7 kg 108.6 kg 108.7 kg    Examination: General exam: Conversant, in no acute distress Respiratory system: normal chest rise, clear, no audible wheezing Cardiovascular system: regular rhythm, s1-s2 Gastrointestinal system: Nondistended, nontender, pos BS Central nervous system: No seizures, no tremors Extremities: No cyanosis, no joint deformities Skin: No rashes, no pallor Psychiatry: Affect  normal // no auditory hallucinations   Data Reviewed: I have personally reviewed following labs and imaging studies  CBC: Recent Labs  Lab 01/04/19 0254 01/05/19 0403 01/06/19 0423 01/07/19 0746 01/08/19 0530  WBC 5.6 5.9 6.3 6.2 5.9  NEUTROABS 2.9 3.2 3.8 4.0 3.0  HGB 11.3* 11.1* 11.3* 11.9* 11.7*  HCT 38.4 36.7 38.0 39.1 40.1  MCV 98.7 97.1 97.2 97.3 96.6  PLT 151 136* 138* 165 400   Basic Metabolic Panel: Recent Labs  Lab 01/04/19 0254 01/05/19 0403 01/06/19 0423 01/07/19 0746 01/08/19 0530 01/09/19 0442  NA 142 138 142 140 141 141  K 3.5 3.5 3.4* 3.5 3.6 4.2  CL 109 104 105 104 105 107  CO2 26 27 27 28 29 28   GLUCOSE 96 100* 115* 115* 115* 112*  BUN 10 9 10 9 13 20   CREATININE 0.50 0.54 0.58 0.57 0.53 0.64  CALCIUM 9.4 9.3 9.6 9.7 9.8 9.8  MG 1.9  --   --  2.0  --   --    GFR: Estimated Creatinine Clearance: 75.9 mL/min (by C-G formula based on SCr of 0.64 mg/dL). Liver Function Tests: Recent Labs  Lab 01/04/19 0254 01/05/19 0403 01/06/19 0423 01/07/19 0746 01/08/19 0530  AST 17 19 22 21 17   ALT 16 18 20 22 19   ALKPHOS 57 63 68 73 69  BILITOT 0.6 0.6 0.5 0.5 0.3  PROT 5.4* 5.4* 5.5* 5.8* 5.7*  ALBUMIN 2.6* 2.7* 2.6* 2.8* 2.8*   No results for input(s): LIPASE, AMYLASE in the last 168 hours. No results for input(s): AMMONIA in the last 168 hours. Coagulation Profile: Recent Labs  Lab 01/03/19 0005  INR 0.9   Cardiac Enzymes: Recent Labs  Lab 01/03/19 0005  CKTOTAL 148  CKMB 10.9*   BNP (last 3 results) No results for input(s): PROBNP in the last 8760 hours. HbA1C: No results for input(s): HGBA1C in the last 72 hours. CBG: Recent Labs  Lab 01/08/19 1241 01/08/19 1617 01/08/19 2151 01/09/19 0724 01/09/19 1139  GLUCAP 131* 79 107* 107* 150*   Lipid Profile: No results for input(s): CHOL, HDL, LDLCALC, TRIG, CHOLHDL, LDLDIRECT in the last 72 hours. Thyroid Function Tests: No results for input(s): TSH, T4TOTAL, FREET4, T3FREE,  THYROIDAB in the last 72 hours. Anemia Panel: No results for input(s): VITAMINB12, FOLATE, FERRITIN, TIBC, IRON, RETICCTPCT in the last 72 hours. Sepsis Labs: Recent Labs  Lab 01/03/19 0005 01/03/19 0224  LATICACIDVEN 1.1 0.9    Recent Results (from the past 240 hour(s))  Blood Culture (routine x 2)     Status: None   Collection Time: 01/03/19 12:05 AM   Specimen: BLOOD LEFT HAND  Result Value Ref Range Status   Specimen Description   Final    BLOOD LEFT HAND Performed at [facility], [place] [address] [place], [place] [address]  Special Requests   Final    BOTTLES DRAWN AEROBIC AND ANAEROBIC Blood Culture adequate volume Performed at [facility], [address] [place], [place] [address]    Culture   Final    NO GROWTH 5 DAYS Performed at [facility], [address] [place], [place] [address]    Report Status 01/08/2019 FINAL  Final  Urine culture     Status: None   Collection Time: 01/03/19 12:05 AM   Specimen: In/Out Cath Urine  Result Value Ref Range Status   Specimen Description   Final    IN/OUT CATH URINE Performed at [facility], [address] [place], [place] [address]    Special Requests   Final    NONE Performed at [facility], [address] [place], [place] [address]    Culture   Final    NO GROWTH Performed at [facility], [address] [place], [place] [address]    Report Status 01/04/2019 FINAL  Final  SARS Coronavirus 2 (CEPHEID - Performed in [facility] lab), Hosp Order     Status: None   Collection Time: 01/03/19 12:06 AM   Specimen: Nasopharyngeal Swab  Result Value Ref Range Status   SARS Coronavirus 2 NEGATIVE NEGATIVE Final    Comment: (NOTE) If result is NEGATIVE SARS-CoV-2 target nucleic acids are NOT DETECTED. The SARS-CoV-2 RNA is generally detectable in upper and lower  respiratory specimens during the acute phase of infection. The lowest   concentration of SARS-CoV-2 viral copies this assay can detect is 250  copies / mL. A negative result does not preclude SARS-CoV-2 infection  and should not be used as the sole basis for treatment or other  patient management decisions.  A negative result may occur with  improper specimen collection / handling, submission of specimen other  than nasopharyngeal swab, presence of viral mutation(s) within the  areas targeted by this assay, and inadequate number of viral copies  (<250 copies / mL). A negative result must be combined with clinical  observations, patient history, and epidemiological information. If result is POSITIVE SARS-CoV-2 target nucleic acids are DETECTED. The SARS-CoV-2 RNA is generally detectable in upper and lower  respiratory specimens dur ing the acute phase of infection.  Positive  results are indicative of active infection with SARS-CoV-2.  Clinical  correlation with patient history and other diagnostic information is  necessary to determine patient infection status.  Positive results do  not rule out bacterial infection or co-infection with other viruses. If result is PRESUMPTIVE POSTIVE SARS-CoV-2 nucleic acids MAY BE PRESENT.   A presumptive positive result was obtained on the submitted specimen  and confirmed on repeat testing.  While 2019 novel coronavirus  (SARS-CoV-2) nucleic acids may be present in the submitted sample  additional confirmatory testing may be necessary for epidemiological  and / or clinical management purposes  to differentiate between  SARS-CoV-2 and other Sarbecovirus currently known to infect humans.  If clinically indicated additional testing with an alternate test  methodology [phone]) is advised. The SARS-CoV-2 RNA is generally  detectable in upper and lower respiratory sp ecimens during the acute  phase of infection. The expected result is Negative. Fact Sheet for Patients:  BoilerBrush.com.cyhttps://www.fda.gov/media/136312/download Fact Sheet  for Healthcare Providers: https://pope.com/https://www.fda.gov/media/136313/download This test is not yet approved or cleared by the [place] FDA and has been authorized for detection and/or diagnosis of SARS-CoV-2 by FDA under an Emergency Use Authorization (EUA).  This EUA will remain in effect (meaning this test can be used) for the duration of the COVID-19 declaration under Section 564(b)(1) of the Act, 21 U.S.C. section 360bbb-3(b)(1), unless the authorization is terminated or revoked sooner. Performed at [facility], [address] [place], [place] [address]   Blood Culture (routine x 2)     Status: None   Collection Time: 01/03/19 12:58 AM   Specimen: BLOOD RIGHT FOREARM  Result Value Ref Range Status   Specimen Description   Final    BLOOD RIGHT FOREARM Performed at [facility], [address] [place], [place] [address]    Special Requests   Final    BOTTLES DRAWN AEROBIC AND ANAEROBIC Blood Culture adequate volume Performed at [facility], [address] [place], [place] [address]    Culture   Final    NO GROWTH 5 DAYS Performed at [facility], [address] [place], [place] [address]    Report Status 01/08/2019 FINAL  Final  MRSA PCR Screening     Status: None   Collection Time: 01/03/19  3:33 AM   Specimen: Nasopharyngeal  Result Value Ref Range Status   MRSA by PCR NEGATIVE NEGATIVE Final    Comment:        The GeneXpert MRSA Assay (FDA approved for NASAL specimens only), is one component of a comprehensive MRSA colonization surveillance program. It is not intended to diagnose MRSA infection nor to guide or monitor treatment for MRSA infections. Performed at [facility], [address] [place], [place] [address]      Radiology Studies: No results found.  Scheduled Meds: . enoxaparin (LOVENOX) injection  40 mg Subcutaneous Q24H  . feeding supplement (ENSURE ENLIVE)  237 mL Oral BID BM   . furosemide  40 mg Oral QODAY  . insulin aspart  0-15 Units Subcutaneous TID WC  . insulin aspart  0-5 Units Subcutaneous QHS  . ketoconazole   Topical BID  . lisinopril  10 mg Oral Daily  . mouth rinse  15 mL Mouth Rinse BID  . multivitamin with minerals  1 tablet Oral Daily  . potassium chloride SA  20 mEq Oral Daily  . potassium chloride  40 mEq Oral Once  . simvastatin  40 mg Oral Daily   Continuous Infusions: . sodium chloride 70 mL/hr at 01/09/19 0654     LOS: 6 days   Rickey BarbaraStephen , MD [facility] Pager On Amion  If 7PM-7AM, please contact night-coverage 01/09/2019, 3:28 PM

## 2019-01-10 LAB — CREATININE, SERUM
Creatinine, Ser: 0.57 mg/dL (ref 0.44–1.00)
GFR calc Af Amer: >60 mL/min (ref >60)
GFR calc non Af Amer: >60 mL/min (ref >60)

## 2019-01-10 LAB — GLUCOSE, CAPILLARY
Glucose-Capillary: 111 mg/dL — ABNORMAL HIGH (ref 70–99)
Glucose-Capillary: 128 mg/dL — ABNORMAL HIGH (ref 70–99)
Glucose-Capillary: 113 mg/dL — ABNORMAL HIGH (ref 70–99)
Glucose-Capillary: 108 mg/dL — ABNORMAL HIGH (ref 70–99)

## 2019-01-10 NOTE — Progress Notes (Signed)
Physical Therapy Treatment Patient Details Name: Penny Carr MRN: [id number] DOB: [birth year] Today's Date: 01/10/2019    History of Present Illness Penny Carr  is a 73 y.o. female, w dementia, apparently has witnessed fall out of wheel chair, laceration on the upper lip and presents to ER for evaluation. She was found to have bradycardia and hypothermia.    PT Comments    Pt received supine resting in bed with no signs of distress. Patient agreeable to therapy this session nodding and stating "yea" to simple questions. Patient able to transfer supine to sit from with [facility] elevated and max assist from 1 person. She initiated LE movement to scoot to EOB but was unable to complete transfer without assist. Patient able to progress WB this session and completed sit to stand transfer with steady requiring max assist from 2 to complete stand and allow paddles to swing behind her for a seat. She performed multiple reps of sit to stand from steady with no assist and maintained standing for ~ 10 minutes with intermittent rest breaks. Patient performed weight shifting and reaching with UE's, she demonstrated a preference to reach with Rt UE and required max cueing to initiate Lt UE movement. She was unable to recognize cues to Lt for reaching and stated she could not see the therapists hand on Lt side. EOS patient left in bedside recliner with chair alarm. Current recommendations remain appropriate for return to ALF with HHPT or SNF depending on level of care ALF can provide. Acute PT will continue to follow.   Follow Up Recommendations  SNF;Supervision/Assistance - 24 hour;Home health PT     Equipment Recommendations       Recommendations for Other Services       Precautions / Restrictions Precautions Precautions: Fall Restrictions Weight Bearing Restrictions: No    Mobility  Bed Mobility Overal bed mobility: Needs Assistance Bed Mobility: Supine to Sit     Supine to sit: Max  assist;+2 for safety/equipment;HOB elevated     General bed mobility comments: HOB elevated for trunk assistance, pt  able to initaite scooting with bil LE, Max assistance required to complete turn and bring LE's off EOB, mod assist for trunk support in sitting at first and improved to min  Transfers Overall transfer level: Needs assistance   Transfers: Sit to/from Stand Sit to Stand: +2 safety/equipment;From elevated surface;+2 physical assistance(bed slightly elevated to fit steady in place for transfer)         General transfer comment: pt required cues to initiate standing and attempted to standing independently, she required 2 person assist to complete stand wtih hips forward to clear space for steady paddles to sit. Patient performed multiple reps sit to stand from steady paddles and maintained standing for ~ 10 minutes with intermittent seated rest breaks throughout. Pt working on weight shifting with UE reaching in standing, requiring max assist to reach with Lt UE, no physical assist for Rt UE. Pt neglecting Lt side and only reaching to Rt.      Balance Overall balance assessment: Needs assistance Sitting-balance support: Bilateral upper extremity supported Sitting balance-Leahy Scale: Good     Standing balance support: Bilateral upper extremity supported Standing balance-Leahy Scale: Poor           Cognition Arousal/Alertness: Awake/alert Behavior During Therapy: Flat affect Overall Cognitive Status: History of cognitive impairments - at baseline Area of Impairment: Orientation;Memory;Attention;Following commands;Problem solving          Orientation Level: Person Current Attention Level: Sustained  Memory: Decreased short-term memory Following Commands: Follows one step commands inconsistently;Follows one step commands with increased time     Problem Solving: Difficulty sequencing;Requires verbal cues;Slow processing;Decreased initiation;Requires tactile  cues General Comments: hx of dementia           PT Goals (current goals can now be found in the care plan section) Acute Rehab PT Goals PT Goal Formulation: Patient unable to participate in goal setting Time For Goal Achievement: 01/21/19 Potential to Achieve Goals: Fair Progress towards PT goals: Progressing toward goals    Frequency    Min 2X/week(would benefit from 3x if able)      PT Plan Current plan remains appropriate       AM-PAC PT "6 Clicks" Mobility   Outcome Measure  Help needed turning from your back to your side while in a flat bed without using bedrails?: A Lot Help needed moving from lying on your back to sitting on the side of a flat bed without using bedrails?: A Lot Help needed moving to and from a bed to a chair (including a wheelchair)?: A Lot Help needed standing up from a chair using your arms (e.g., wheelchair or bedside chair)?: A Lot Help needed to walk in hospital room?: Total Help needed climbing 3-5 steps with a railing? : Total 6 Click Score: 10    End of Session Equipment Utilized During Treatment: Gait belt(steady) Activity Tolerance: Patient tolerated treatment well Patient left: in chair;with call bell/phone within reach;with chair alarm set(left lift pad under patient just in case, however the use of the stedy was educated to nursing staff for back to bed transfer.) Nurse Communication: Mobility status PT Visit Diagnosis: Muscle weakness (generalized) (M62.81);Difficulty in walking, not elsewhere classified (R26.2)     Time: 1779-3903 PT Time Calculation (min) (ACUTE ONLY): 31 min  Charges:  $Therapeutic Activity: 23-37 mins                     Kipp Brood, PT, DPT, [facility] Physical Therapist with [facility]  01/10/2019 12:12 PM

## 2019-01-10 NOTE — Progress Notes (Signed)
PROGRESS NOTE    Penny Carr  OJJ:[id number] DOB: 09-16-1945 DOA: 01/02/2019 PCP: Octavio Graves, DO (Inactive)    Brief Narrative:  73 y.o. female, w dementia, apparently has witnessed fall out of wheel chair, laceration on the upper lip and presents to ER for evaluation. In the ED, pt was found to be hypothermic and bradycardic  Assessment & Plan:   Principal Problem:   Hypothermia Active Problems:   Acute lower UTI   Bradycardia   Diabetes (HCC)   Dementia (HCC)  Hypothermia, sepsis ruled out No osborne j wave on ekg was noted Blood culture x2 noted to be negative thus far Had stopped Depakote at time of presentation as it can cause hypothermia Had also stopped Haldol since it too can cause hypothermia Hypothermia since resolved, remains stable  Acute lower uti without sepsis Blood culture x2 neg thus far Urine culture pending,no growth noted Completed 4 days of rocephin Pt remains stable  Bradycardia (chronic), asymptomatic Continued on Tele Trop neg thus far NOTE Namenda, Lexapro, Depakote can cause bradycardia Bradycardic this AM, asymptomatic. Held lexapro and namenda Valium may also cause bradycardia. Cont valium as PRN only HR improved, now in the 70's while awake  Dm2 - Continue with SSI coverage - Glucose remains stable  Hyperlipidemia -Cont Simvastatin 40mg  po qhs as tolerated -Presently stable  Dementia Namenda XR 28mg  po qday held per above Lexapro 10mg  po qday held per above Valium only PRN per above Mentation improved since presentation Seen by PT/OT. Discussed with SW. Plan for SNF COVID screen remains pending  DVT prophylaxis: Lovenox subQ Code Status: Full Family Communication: Pt in room, family not at bedside Disposition Plan: SNF possible in 1-2 days pending covid testing  Consultants:     Procedures:     Antimicrobials: Anti-infectives (From admission, onward)   Start     Dose/Rate Route Frequency Ordered Stop    01/03/19 1000  cefTRIAXone (ROCEPHIN) 1 g in sodium chloride 0.9 % 100 mL IVPB  Status:  Discontinued     1 g 200 mL/hr over 30 Minutes Intravenous Every 24 hours 01/03/19 0247 01/03/19 0713   01/03/19 1000  cefTRIAXone (ROCEPHIN) 2 g in sodium chloride 0.9 % 100 mL IVPB  Status:  Discontinued     2 g 200 mL/hr over 30 Minutes Intravenous Every 24 hours 01/03/19 0713 01/07/19 1602   01/03/19 0030  vancomycin (VANCOCIN) 2,000 mg in sodium chloride 0.9 % 500 mL IVPB     2,000 mg 250 mL/hr over 120 Minutes Intravenous  Once 01/03/19 0020 01/03/19 0353   01/03/19 0015  ceFEPIme (MAXIPIME) 2 g in sodium chloride 0.9 % 100 mL IVPB     2 g 200 mL/hr over 30 Minutes Intravenous  Once 01/03/19 0005 01/03/19 0137   01/03/19 0015  metroNIDAZOLE (FLAGYL) IVPB 500 mg     500 mg 100 mL/hr over 60 Minutes Intravenous  Once 01/03/19 0005 01/03/19 0213   01/03/19 0015  vancomycin (VANCOCIN) IVPB 1000 mg/200 mL premix  Status:  Discontinued     1,000 mg 200 mL/hr over 60 Minutes Intravenous  Once 01/03/19 0005 01/03/19 0020      Subjective: No complaints this AM  Objective: Vitals:   01/09/19 1940 01/10/19 0409 01/10/19 0500 01/10/19 1245  BP: 132/65 130/68  (!) 142/75  Pulse: 76 75  61  Resp: 18 18  19   Temp: 97.6 F (36.4 C) 97.8 F (36.6 C)  98.3 F (36.8 C)  TempSrc: Oral Oral  Axillary  SpO2: 97% 96%  98%  Weight:   108.5 kg   Height:        Intake/Output Summary (Last 24 hours) at 01/10/2019 1455 Last data filed at 01/10/2019 1400 Gross per 24 hour  Intake 1661.69 ml  Output 2050 ml  Net -388.31 ml   Filed Weights   01/08/19 0521 01/09/19 0500 01/10/19 0500  Weight: 108.6 kg 108.7 kg 108.5 kg    Examination: General exam: Awake, laying in bed, in nad Respiratory system: Normal respiratory effort, no wheezing Cardiovascular system: regular rate, s1, s2 Gastrointestinal system: Soft, nondistended, positive BS Central nervous system: CN2-12 grossly intact, strength intact  Extremities: Perfused, no clubbing Skin: Normal skin turgor, no notable skin lesions seen Psychiatry: Mood normal // no visual hallucinations   Data Reviewed: I have personally reviewed following labs and imaging studies  CBC: Recent Labs  Lab 01/04/19 0254 01/05/19 0403 01/06/19 0423 01/07/19 0746 01/08/19 0530  WBC 5.6 5.9 6.3 6.2 5.9  NEUTROABS 2.9 3.2 3.8 4.0 3.0  HGB 11.3* 11.1* 11.3* 11.9* 11.7*  HCT 38.4 36.7 38.0 39.1 40.1  MCV 98.7 97.1 97.2 97.3 96.6  PLT 151 136* 138* 165 180   Basic Metabolic Panel: Recent Labs  Lab 01/04/19 0254 01/05/19 0403 01/06/19 0423 01/07/19 0746 01/08/19 0530 01/09/19 0442 01/10/19 0517  NA 142 138 142 140 141 141  --   K 3.5 3.5 3.4* 3.5 3.6 4.2  --   CL 109 104 105 104 105 107  --   CO2 26 27 27 28 29 28   --   GLUCOSE 96 100* 115* 115* 115* 112*  --   BUN 10 9 10 9 13 20   --   CREATININE 0.50 0.54 0.58 0.57 0.53 0.64 0.57  CALCIUM 9.4 9.3 9.6 9.7 9.8 9.8  --   MG 1.9  --   --  2.0  --   --   --    GFR: Estimated Creatinine Clearance: 75.8 mL/min (by C-G formula based on SCr of 0.57 mg/dL). Liver Function Tests: Recent Labs  Lab 01/04/19 0254 01/05/19 0403 01/06/19 0423 01/07/19 0746 01/08/19 0530  AST 17 19 22 21 17   ALT 16 18 20 22 19   ALKPHOS 57 63 68 73 69  BILITOT 0.6 0.6 0.5 0.5 0.3  PROT 5.4* 5.4* 5.5* 5.8* 5.7*  ALBUMIN 2.6* 2.7* 2.6* 2.8* 2.8*   No results for input(s): LIPASE, AMYLASE in the last 168 hours. No results for input(s): AMMONIA in the last 168 hours. Coagulation Profile: No results for input(s): INR, PROTIME in the last 168 hours. Cardiac Enzymes: No results for input(s): CKTOTAL, CKMB, CKMBINDEX, TROPONINI in the last 168 hours. BNP (last 3 results) No results for input(s): PROBNP in the last 8760 hours. HbA1C: No results for input(s): HGBA1C in the last 72 hours. CBG: Recent Labs  Lab 01/09/19 1139 01/09/19 1629 01/09/19 2035 01/10/19 0724 01/10/19 1153  GLUCAP 150* 112* 113*  111* 128*   Lipid Profile: No results for input(s): CHOL, HDL, LDLCALC, TRIG, CHOLHDL, LDLDIRECT in the last 72 hours. Thyroid Function Tests: No results for input(s): TSH, T4TOTAL, FREET4, T3FREE, THYROIDAB in the last 72 hours. Anemia Panel: No results for input(s): VITAMINB12, FOLATE, FERRITIN, TIBC, IRON, RETICCTPCT in the last 72 hours. Sepsis Labs: No results for input(s): PROCALCITON, LATICACIDVEN in the last 168 hours.  Recent Results (from the past 240 hour(s))  Blood Culture (routine x 2)     Status: None   Collection Time: 01/03/19 12:05 AM  Specimen: BLOOD LEFT HAND  Result Value Ref Range Status   Specimen Description   Final    BLOOD LEFT HAND Performed at [facility], [address] [place], [place] [address]    Special Requests   Final    BOTTLES DRAWN AEROBIC AND ANAEROBIC Blood Culture adequate volume Performed at [facility], [address] [place], [place] [address]    Culture   Final    NO GROWTH 5 DAYS Performed at [facility], [address] [place], [place] [address]    Report Status 01/08/2019 FINAL  Final  Urine culture     Status: None   Collection Time: 01/03/19 12:05 AM   Specimen: In/Out Cath Urine  Result Value Ref Range Status   Specimen Description   Final    IN/OUT CATH URINE Performed at [facility], [address] [place], [place] [address]    Special Requests   Final    NONE Performed at [facility], [address] [place], [place] [address]    Culture   Final    NO GROWTH Performed at [facility], [address] [place], [place] [address]    Report Status 01/04/2019 FINAL  Final  SARS Coronavirus 2 (CEPHEID - Performed in [facility] hospital lab), Hosp Order     Status: None   Collection Time: 01/03/19 12:06 AM   Specimen: Nasopharyngeal Swab  Result Value Ref Range Status   SARS Coronavirus 2 NEGATIVE NEGATIVE Final    Comment: (NOTE) If  result is NEGATIVE SARS-CoV-2 target nucleic acids are NOT DETECTED. The SARS-CoV-2 RNA is generally detectable in upper and lower  respiratory specimens during the acute phase of infection. The lowest  concentration of SARS-CoV-2 viral copies this assay can detect is 250  copies / mL. A negative result does not preclude SARS-CoV-2 infection  and should not be used as the sole basis for treatment or other  patient management decisions.  A negative result may occur with  improper specimen collection / handling, submission of specimen other  than nasopharyngeal swab, presence of viral mutation(s) within the  areas targeted by this assay, and inadequate number of viral copies  (<250 copies / mL). A negative result must be combined with clinical  observations, patient history, and epidemiological information. If result is POSITIVE SARS-CoV-2 target nucleic acids are DETECTED. The SARS-CoV-2 RNA is generally detectable in upper and lower  respiratory specimens dur ing the acute phase of infection.  Positive  results are indicative of active infection with SARS-CoV-2.  Clinical  correlation with patient history and other diagnostic information is  necessary to determine patient infection status.  Positive results do  not rule out bacterial infection or co-infection with other viruses. If result is PRESUMPTIVE POSTIVE SARS-CoV-2 nucleic acids MAY BE PRESENT.   A presumptive positive result was obtained on the submitted specimen  and confirmed on repeat testing.  While 2019 novel coronavirus  (SARS-CoV-2) nucleic acids may be present in the submitted sample  additional confirmatory testing may be necessary for epidemiological  and / or clinical management purposes  to differentiate between  SARS-CoV-2 and other Sarbecovirus currently known to infect humans.  If clinically indicated additional testing with an alternate test  methodology [phone] is advised. The SARS-CoV-2 RNA is generally   detectable in upper and lower respiratory sp ecimens during the acute  phase of infection. The expected result is Negative. Fact Sheet for Patients:  BoilerBrush.com.cyhttps://www.fda.gov/media/136312/download Fact Sheet for Healthcare Providers: https://pope.com/https://www.fda.gov/media/136313/download This test is not yet approved or cleared by the [place] FDA and has been authorized for detection and/or diagnosis of SARS-CoV-2 by FDA under an Emergency Use Authorization (EUA).  This EUA will remain in effect (meaning this test can be used) for the duration of the COVID-19 declaration under Section 564(b)(1) of the Act, 21 U.S.C. section 360bbb-3(b)(1), unless the authorization is terminated or revoked sooner. Performed at [facility], [address] [place], [place] [address]   Blood Culture (routine x 2)     Status: None   Collection Time: 01/03/19 12:58 AM   Specimen: BLOOD RIGHT FOREARM  Result Value Ref Range Status   Specimen Description   Final    BLOOD RIGHT FOREARM Performed at [facility], [address] [place], [place] [address]    Special Requests   Final    BOTTLES DRAWN AEROBIC AND ANAEROBIC Blood Culture adequate volume Performed at [facility], [address] [place], [place] [address]    Culture   Final    NO GROWTH 5 DAYS Performed at [facility], [address] [place], [place] [address]    Report Status 01/08/2019 FINAL  Final  MRSA PCR Screening     Status: None   Collection Time: 01/03/19  3:33 AM   Specimen: Nasopharyngeal  Result Value Ref Range Status   MRSA by PCR NEGATIVE NEGATIVE Final    Comment:        The GeneXpert MRSA Assay (FDA approved for NASAL specimens only), is one component of a comprehensive MRSA colonization surveillance program. It is not intended to diagnose MRSA infection nor to guide or monitor treatment for MRSA infections. Performed at [facility], [address] [place], [place] [address]      Radiology Studies: No results found.  Scheduled Meds: . enoxaparin (LOVENOX) injection  40 mg Subcutaneous Q24H  . feeding supplement (ENSURE ENLIVE)  237 mL Oral BID BM  . furosemide  40 mg Oral QODAY  . insulin aspart  0-15 Units Subcutaneous TID WC  . insulin aspart  0-5 Units Subcutaneous QHS  . ketoconazole   Topical BID  . lisinopril  10 mg Oral Daily  . mouth rinse  15 mL Mouth Rinse BID  . multivitamin with minerals  1 tablet Oral Daily  . potassium chloride SA  20 mEq Oral Daily  . potassium chloride  40 mEq Oral Once  . simvastatin  40 mg Oral Daily   Continuous Infusions: . sodium chloride 70 mL/hr at 01/09/19 2148     LOS: 7 days   Rickey BarbaraStephen Chiu, MD [facility] Pager On Amion  If 7PM-7AM, please contact night-coverage 01/10/2019, 2:55 PM

## 2019-01-10 NOTE — Progress Notes (Signed)
Spoke with pt's son who selected [facility].  Referral given to [facility] CSW/Admission Coordinator. Waiting for a call back.

## 2019-01-11 DIAGNOSIS — W19XXXA Unspecified fall, initial encounter: Secondary | ICD-10-CM

## 2019-01-11 DIAGNOSIS — E119 Type 2 diabetes mellitus without complications: Secondary | ICD-10-CM

## 2019-01-11 LAB — BASIC METABOLIC PANEL
Anion gap: 8 (ref 5–15)
BUN: 13 mg/dL (ref 8–23)
CO2: 25 mmol/L (ref 22–32)
Calcium: 9.8 mg/dL (ref 8.9–10.3)
Chloride: 107 mmol/L (ref 98–111)
Creatinine, Ser: 0.58 mg/dL (ref 0.44–1.00)
GFR calc Af Amer: >60 mL/min (ref >60)
GFR calc non Af Amer: >60 mL/min (ref >60)
Glucose, Bld: 108 mg/dL — ABNORMAL HIGH (ref 70–99)
Potassium: 4 mmol/L (ref 3.5–5.1)
Sodium: 140 mmol/L (ref 135–145)

## 2019-01-11 LAB — GLUCOSE, CAPILLARY
Glucose-Capillary: 98 mg/dL (ref 70–99)
Glucose-Capillary: 122 mg/dL — ABNORMAL HIGH (ref 70–99)
Glucose-Capillary: 116 mg/dL — ABNORMAL HIGH (ref 70–99)
Glucose-Capillary: 100 mg/dL — ABNORMAL HIGH (ref 70–99)

## 2019-01-11 LAB — CBC
HCT: 37.8 % (ref 36.0–46.0)
Hemoglobin: 11.3 g/dL — ABNORMAL LOW (ref 12.0–15.0)
MCH: 29.1 pg (ref 26.0–34.0)
MCHC: 29.9 g/dL — ABNORMAL LOW (ref 30.0–36.0)
MCV: 97.4 fL (ref 80.0–100.0)
Platelets: 194 10*3/uL (ref 150–400)
RBC: 3.88 MIL/uL (ref 3.87–5.11)
RDW: 17.8 % — ABNORMAL HIGH (ref 11.5–15.5)
WBC: 7.1 10*3/uL (ref 4.0–10.5)
nRBC: 0 % (ref 0.0–0.2)

## 2019-01-11 MED ORDER — ENSURE ENLIVE PO LIQD
237.0000 mL | ORAL | Status: DC
  Administered 2019-01-12: 237 mL via ORAL

## 2019-01-11 NOTE — Progress Notes (Signed)
PROGRESS NOTE  Penny Carr ONG:[id number] DOB: [birth year] DOA: 01/02/2019 PCP: Samuel JesterButler, Cynthia, DO (Inactive)  Brief Narrative:  73 y.o.female,w dementia, apparently has witnessed fall out of wheel chair, laceration on the upper lip and presents to ER for evaluation. In the ED, pt was found to be hypothermic and bradycardic   HPI/Recap of past 24 hours:  No acute event last 24hrs She has advanced dementia, does not follow commands, does not answer questions appropriately No agitation   Assessment/Plan: Principal Problem:   Hypothermia Active Problems:   Acute lower UTI   Bradycardia   Diabetes (HCC)   Dementia (HCC)  witnessed fall out of wheel chair, laceration on the upper lip (presenting complaints) CT brain and c spine no acute findings Hip xray no acute findings She is from assisted living, she needs higher level of care, awaiting for SNF bed Avoid polypharmacy, namenda, lexapro, depakote held since admission  Hypothermia, possible UTI, she does not appear to has sepsis on presentation ua on presentation Wbc 11-20  Rbc 0-5 with rare bacteremia, patient is not able to provide history Blood and urine culture no growth, cxr no acute findings Vanc/cefepime/flagyl on presentation x1 Rocephin x5 days Hypothermia has resolved  Bradycardia (chronic), asymptomatic tsh 3.3 Echo with preserved lvef Namenda, Lexapro, Depakote held since admission Heart rate is improving  Chronic bilateral lower extremity edema/venous stasis, possible underline chronic diastolic chf (echo with Left ventricular diastolic Doppler parameters are consistent with impaired relaxation. Elevated mean left atrial pressure ) -una boots Continue home everyotherday lasix  Diet controlled DM2 a1c 5.8 Blood glucose stable  Hyperlipidemia -Cont Simvastatin 40mg  po qhs as tolerated -Presently stable  Dementia/FTT Ct head with "Stable chronic microvascular ischemic changes and volume loss of  the brain" Namenda XR 28mg  po qday held per above Lexapro 10mg  po qday held per above Valium only PRN per above SNF placement  Code Status: full  Family Communication: patient   Disposition Plan: SNF with palliative care   Consultants:  none  Procedures:  none  Antibiotics:  Vanc/cefepime/flagyl on presentation x1  Rocephin x5 days   Objective: BP (!) 123/110 (BP Location: Left Arm)   Pulse 78   Temp (!) 97.4 F (36.3 C) (Axillary)   Resp 16   Ht 5\' 6"  (1.676 m)   Wt 108 kg   SpO2 97%   BMI 38.43 kg/m   Intake/Output Summary (Last 24 hours) at 01/11/2019 1648 Last data filed at 01/11/2019 1519 Gross per 24 hour  Intake 263 ml  Output 2000 ml  Net -1737 ml   Filed Weights   01/09/19 0500 01/10/19 0500 01/11/19 0336  Weight: 108.7 kg 108.5 kg 108 kg    Exam: Patient is examined daily including today on 01/11/2019, exams remain the same as of yesterday except that has changed    General:  NAD, demented, only oriented to self, does not answer questions appropriately, though calm , no agitation   Cardiovascular: RRR  Respiratory: CTABL  Abdomen: Soft/ND/NT, positive BS  Musculoskeletal: bilateral unna boot, right ankle wound appear healing, dry, nontender, right foot appear more puffy then left foot  Neuro: alert, oriented to self only   Data Reviewed: Basic Metabolic Panel: Recent Labs  Lab 01/06/19 0423 01/07/19 0746 01/08/19 0530 01/09/19 0442 01/10/19 0517 01/11/19 0446  NA 142 140 141 141  --  140  K 3.4* 3.5 3.6 4.2  --  4.0  CL 105 104 105 107  --  107  CO2 27 28  29 28  --  25  GLUCOSE 115* 115* 115* 112*  --  108*  BUN 10 9 13 20   --  13  CREATININE 0.58 0.57 0.53 0.64 0.57 0.58  CALCIUM 9.6 9.7 9.8 9.8  --  9.8  MG  --  2.0  --   --   --   --    Liver Function Tests: Recent Labs  Lab 01/05/19 0403 01/06/19 0423 01/07/19 0746 01/08/19 0530  AST 19 22 21 17   ALT 18 20 22 19   ALKPHOS 63 68 73 69  BILITOT 0.6 0.5 0.5 0.3   PROT 5.4* 5.5* 5.8* 5.7*  ALBUMIN 2.7* 2.6* 2.8* 2.8*   No results for input(s): LIPASE, AMYLASE in the last 168 hours. No results for input(s): AMMONIA in the last 168 hours. CBC: Recent Labs  Lab 01/05/19 0403 01/06/19 0423 01/07/19 0746 01/08/19 0530 01/11/19 0446  WBC 5.9 6.3 6.2 5.9 7.1  NEUTROABS 3.2 3.8 4.0 3.0  --   HGB 11.1* 11.3* 11.9* 11.7* 11.3*  HCT 36.7 38.0 39.1 40.1 37.8  MCV 97.1 97.2 97.3 96.6 97.4  PLT 136* 138* 165 180 194   Cardiac Enzymes:   No results for input(s): CKTOTAL, CKMB, CKMBINDEX, TROPONINI in the last 168 hours. BNP (last 3 results) Recent Labs    06/20/18 1336  BNP 89.8    ProBNP (last 3 results) No results for input(s): PROBNP in the last 8760 hours.  CBG: Recent Labs  Lab 01/10/19 1153 01/10/19 1619 01/10/19 2117 01/11/19 0757 01/11/19 1144  GLUCAP 128* 113* 108* 98 122*    Recent Results (from the past 240 hour(s))  Blood Culture (routine x 2)     Status: None   Collection Time: 01/03/19 12:05 AM   Specimen: BLOOD LEFT HAND  Result Value Ref Range Status   Specimen Description   Final    BLOOD LEFT HAND Performed at [facility], [place] [address] [place], [place] [address]    Special Requests   Final    BOTTLES DRAWN AEROBIC AND ANAEROBIC Blood Culture adequate volume Performed at [place] [address], [place], [place] [address]    Culture   Final    NO GROWTH 5 DAYS Performed at [facility], [place] [address] [place], [place] [address]    Report Status 01/08/2019 FINAL  Final  Urine culture     Status: None   Collection Time: 01/03/19 12:05 AM   Specimen: In/Out Cath Urine  Result Value Ref Range Status   Specimen Description   Final    IN/OUT CATH URINE Performed at [facility] [address] [place], [place] [address]    Special Requests   Final    NONE Performed at [facility], [place] [address], [place], [place] [address]     Culture   Final    NO GROWTH Performed at [facility], [place] [address], [place], [place] [address]    Report Status 01/04/2019 FINAL  Final  SARS Coronavirus 2 (CEPHEID - Performed in [place] hospital lab), Hosp Order     Status: None   Collection Time: 01/03/19 12:06 AM   Specimen: Nasopharyngeal Swab  Result Value Ref Range Status   SARS Coronavirus 2 NEGATIVE NEGATIVE Final    Comment: (NOTE) If result is NEGATIVE SARS-CoV-2 target nucleic acids are NOT DETECTED. The SARS-CoV-2 RNA is generally detectable in upper and lower  respiratory specimens during the acute phase  of infection. The lowest  concentration of SARS-CoV-2 viral copies this assay can detect is 250  copies / mL. A negative result does not preclude SARS-CoV-2 infection  and should not be used as the sole basis for treatment or other  patient management decisions.  A negative result may occur with  improper specimen collection / handling, submission of specimen other  than nasopharyngeal swab, presence of viral mutation(s) within the  areas targeted by this assay, and inadequate number of viral copies  (<250 copies / mL). A negative result must be combined with clinical  observations, patient history, and epidemiological information. If result is POSITIVE SARS-CoV-2 target nucleic acids are DETECTED. The SARS-CoV-2 RNA is generally detectable in upper and lower  respiratory specimens dur ing the acute phase of infection.  Positive  results are indicative of active infection with SARS-CoV-2.  Clinical  correlation with patient history and other diagnostic information is  necessary to determine patient infection status.  Positive results do  not rule out bacterial infection or co-infection with other viruses. If result is PRESUMPTIVE POSTIVE SARS-CoV-2 nucleic acids MAY BE PRESENT.   A presumptive positive result was obtained on the submitted specimen  and confirmed on repeat testing.  While 2019 novel  coronavirus  (SARS-CoV-2) nucleic acids may be present in the submitted sample  additional confirmatory testing may be necessary for epidemiological  and / or clinical management purposes  to differentiate between  SARS-CoV-2 and other Sarbecovirus currently known to infect humans.  If clinically indicated additional testing with an alternate test  methodology [phone]([id number]) is advised. The SARS-CoV-2 RNA is generally  detectable in upper and lower respiratory sp ecimens during the acute  phase of infection. The expected result is Negative. Fact Sheet for Patients:  BoilerBrush.com.cyhttps://www.fda.gov/media/136312/download Fact Sheet for Healthcare Providers: https://pope.com/https://www.fda.gov/media/136313/download This test is not yet approved or cleared by the [place] FDA and has been authorized for detection and/or diagnosis of SARS-CoV-2 by FDA under an Emergency Use Authorization (EUA).  This EUA will remain in effect (meaning this test can be used) for the duration of the COVID-19 declaration under Section 564(b)(1) of the Act, 21 U.S.C. section 360bbb-3(b)(1), unless the authorization is terminated or revoked sooner. Performed at [facility], [address] [place], [place] [address]   Blood Culture (routine x 2)     Status: None   Collection Time: 01/03/19 12:58 AM   Specimen: BLOOD RIGHT FOREARM  Result Value Ref Range Status   Specimen Description   Final    BLOOD RIGHT FOREARM Performed at [facility], [address] [place], [place] [address]    Special Requests   Final    BOTTLES DRAWN AEROBIC AND ANAEROBIC Blood Culture adequate volume Performed at [facility], [address] [place], [place] [address]    Culture   Final    NO GROWTH 5 DAYS Performed at [facility], [address] [place], [place] [address]    Report Status 01/08/2019 FINAL  Final  MRSA PCR Screening     Status: None   Collection Time: 01/03/19  3:33 AM   Specimen:  Nasopharyngeal  Result Value Ref Range Status   MRSA by PCR NEGATIVE NEGATIVE Final    Comment:        The GeneXpert MRSA Assay (FDA approved for NASAL specimens only), is one component of a comprehensive MRSA colonization surveillance program. It is not intended to diagnose MRSA infection nor to guide or monitor treatment  for MRSA infections. Performed at [facility], [address] [place], [place] [address]      Studies: No results found.  Scheduled Meds: . enoxaparin (LOVENOX) injection  40 mg Subcutaneous Q24H  . [START ON 01/12/2019] feeding supplement (ENSURE ENLIVE)  237 mL Oral Q24H  . furosemide  40 mg Oral QODAY  . insulin aspart  0-15 Units Subcutaneous TID WC  . insulin aspart  0-5 Units Subcutaneous QHS  . ketoconazole   Topical BID  . lisinopril  10 mg Oral Daily  . mouth rinse  15 mL Mouth Rinse BID  . multivitamin with minerals  1 tablet Oral Daily  . potassium chloride SA  20 mEq Oral Daily  . potassium chloride  40 mEq Oral Once  . simvastatin  40 mg Oral Daily    Continuous Infusions:   Time spent: 25mins I have personally reviewed and interpreted on  01/11/2019 daily labs, tele strips, imagings as discussed above under date review session and assessment and plans.  I reviewed all nursing notes, pharmacy notes,  vitals, pertinent old records  I have discussed plan of care as described above with RN , patient  on 01/11/2019   Albertine GratesFang Brodrick Curran MD, PhD  [facility] Pager [phone]. If 7PM-7AM, please contact night-coverage at [email or web address], password [email or web address] 01/11/2019, 4:48 PM  LOS: 8 days

## 2019-01-11 NOTE — Progress Notes (Signed)
Nutrition Follow-up  RD working remotely.   DOCUMENTATION CODES:   Obesity unspecified  INTERVENTION:  - continue Ensure Enlive but will decrease from BID to once/day.  - continue to encourage PO intakes.   NUTRITION DIAGNOSIS:   Inadequate oral intake related to acute illness, lethargy/confusion as evidenced by meal completion < 25%. - slowly improving  GOAL:   Patient will meet greater than or equal to 90% of their needs -unmet on average  MONITOR:   PO intake, Supplement acceptance, Labs, Weight trends  ASSESSMENT:   73 year-old female with medical history of Alzheimer's dementia and DM. She sustained a witnessed fall out of her wheelchair with subsequent laceration to the upper lip. She presented to the ED for evaluation and while in the ED was noted to be hypothermic and bradycardic.  Per RN flow sheet, patient is a/o to self only. Per review of order, she has been accepting Ensure about 50% of the time offered. Flow sheet indicates that she ate the following at meals recently: 7/20- 85% of breakfast, 80% of lunch, and 75% of dinner 7/21- 25% of lunch and dinner 7/22- 25% of breakfast and 50% of lunch  Per notes: - hypthermia--resolved - acute lower UTI - dementia - PT/OT following and plan for SNF at d/c    Labs reviewed; CBGs: 98 and 122 mg/dl today. Medications reviewed; 40 mg oral lasix/day, sliding scale novolog, daily multivitamin with minerals, 20 mEq K-Dur/day. IVF; NS @ 70 ml/hr.    Diet Order:   Diet Order            Diet heart healthy/carb modified Room service appropriate? Yes; Fluid consistency: Thin  Diet effective now              EDUCATION NEEDS:   Not appropriate for education at this time  Skin:  Skin Assessment: Reviewed RN Assessment  Last BM:  7/21  Height:   Ht Readings from Last 1 Encounters:  01/03/19 5\' 6"  (1.676 m)    Weight:   Wt Readings from Last 1 Encounters:  01/11/19 108 kg    Ideal Body Weight:  59.1  kg  BMI:  Body mass index is 38.43 kg/m.  Estimated Nutritional Needs:   Kcal:  1680-1900 kcal  Protein:  65-75 grams  Fluid:  >/= 1.8 L/day     Jarome Matin, MS, RD, LDN, [facility] Inpatient Clinical Dietitian Pager # [phone] After hours/weekend pager # [phone]

## 2019-01-12 DIAGNOSIS — F0150 Vascular dementia without behavioral disturbance: Secondary | ICD-10-CM

## 2019-01-12 LAB — GLUCOSE, CAPILLARY
Glucose-Capillary: 106 mg/dL — ABNORMAL HIGH (ref 70–99)
Glucose-Capillary: 149 mg/dL — ABNORMAL HIGH (ref 70–99)
Glucose-Capillary: 84 mg/dL (ref 70–99)
Glucose-Capillary: 104 mg/dL — ABNORMAL HIGH (ref 70–99)

## 2019-01-12 LAB — NOVEL CORONAVIRUS, NAA (HOSP ORDER, SEND-OUT TO REF LAB; TAT 18-24 HRS): SARS-CoV-2, NAA: NOT DETECTED

## 2019-01-12 LAB — SARS CORONAVIRUS 2 BY RT PCR (HOSPITAL ORDER, PERFORMED IN ~~LOC~~ HOSPITAL LAB): SARS Coronavirus 2: NEGATIVE

## 2019-01-12 NOTE — Progress Notes (Signed)
PROGRESS NOTE  Penny GingerDorretta S Carr TKZ:[id number] DOB: [birth year] DOA: 01/02/2019 PCP: Samuel JesterButler, Cynthia, DO (Inactive)  Brief Narrative:  73 y.o.female,w dementia, apparently has witnessed fall out of wheel chair, laceration on the upper lip and presents to ER for evaluation. In the ED, pt was found to be hypothermic and bradycardic   HPI/Recap of past 24 hours:  No acute event last 24hrs She has advanced dementia, does not follow commands, does not answer questions appropriately No agitation   Assessment/Plan: Principal Problem:   Hypothermia Active Problems:   Acute lower UTI   Bradycardia   Diabetes (HCC)   Dementia (HCC)   Fall   Diet-controlled diabetes mellitus (HCC)  witnessed fall out of wheel chair, laceration on the upper lip (presenting complaints) CT brain and c spine no acute findings Hip xray no acute findings She is from assisted living, she needs higher level of care, awaiting for SNF bed Avoid polypharmacy, namenda, lexapro, depakote held since admission  Hypothermia, possible UTI, she does not appear to has sepsis on presentation ua on presentation Wbc 11-20  Rbc 0-5 with rare bacteremia, patient is not able to provide history Blood and urine culture no growth, cxr no acute findings Vanc/cefepime/flagyl on presentation x1 Rocephin x5 days Hypothermia has resolved  Bradycardia (chronic), asymptomatic tsh 3.3 Echo with preserved lvef Namenda, Lexapro, Depakote held since admission Heart rate is improving  Chronic bilateral lower extremity edema/venous stasis, possible underline chronic diastolic chf (echo with Left ventricular diastolic Doppler parameters are consistent with impaired relaxation. Elevated mean left atrial pressure ) -una boots Continue home everyotherday lasix  Diet controlled DM2 a1c 5.8 Blood glucose stable  Hyperlipidemia -Cont Simvastatin 40mg  po qhs as tolerated -Presently stable  Dementia/FTT Ct head with "Stable chronic  microvascular ischemic changes and volume loss of the brain" Namenda XR 28mg  po qday held per above Lexapro 10mg  po qday held per above Valium only PRN per above SNF placement  Code Status: full  Family Communication: patient   Disposition Plan: SNF with palliative care on 7/24, repeat COVID screening is negative on 7/23   Consultants:  none  Procedures:  none  Antibiotics:  Vanc/cefepime/flagyl on presentation x1  Rocephin x5 days   Objective: BP (!) 142/88 (BP Location: Left Arm)   Pulse (!) 53   Temp 97.8 F (36.6 C) (Oral)   Resp 17   Ht 5\' 6"  (1.676 m)   Wt 108.6 kg   SpO2 98%   BMI 38.64 kg/m   Intake/Output Summary (Last 24 hours) at 01/12/2019 1105 Last data filed at 01/12/2019 0600 Gross per 24 hour  Intake 298 ml  Output 1751 ml  Net -1453 ml   Filed Weights   01/10/19 0500 01/11/19 0336 01/12/19 0500  Weight: 108.5 kg 108 kg 108.6 kg    Exam: Patient is examined daily including today on 01/12/2019, exams remain the same as of yesterday except that has changed    General:  NAD, demented, only oriented to self, does not answer questions appropriately, though calm , no agitation   Cardiovascular: RRR  Respiratory: CTABL  Abdomen: Soft/ND/NT, positive BS  Musculoskeletal: bilateral unna boot, right ankle wound appear healing, dry, nontender, right foot appear more puffy then left foot  Neuro: alert, oriented to self only   Data Reviewed: Basic Metabolic Panel: Recent Labs  Lab 01/06/19 0423 01/07/19 0746 01/08/19 0530 01/09/19 0442 01/10/19 0517 01/11/19 0446  NA 142 140 141 141  --  140  K 3.4* 3.5 3.6 4.2  --  4.0  CL 105 104 105 107  --  107  CO2 27 28 29 28   --  25  GLUCOSE 115* 115* 115* 112*  --  108*  BUN 10 9 13 20   --  13  CREATININE 0.58 0.57 0.53 0.64 0.57 0.58  CALCIUM 9.6 9.7 9.8 9.8  --  9.8  MG  --  2.0  --   --   --   --    Liver Function Tests: Recent Labs  Lab 01/06/19 0423 01/07/19 0746 01/08/19 0530   AST 22 21 17   ALT 20 22 19   ALKPHOS 68 73 69  BILITOT 0.5 0.5 0.3  PROT 5.5* 5.8* 5.7*  ALBUMIN 2.6* 2.8* 2.8*   No results for input(s): LIPASE, AMYLASE in the last 168 hours. No results for input(s): AMMONIA in the last 168 hours. CBC: Recent Labs  Lab 01/06/19 0423 01/07/19 0746 01/08/19 0530 01/11/19 0446  WBC 6.3 6.2 5.9 7.1  NEUTROABS 3.8 4.0 3.0  --   HGB 11.3* 11.9* 11.7* 11.3*  HCT 38.0 39.1 40.1 37.8  MCV 97.2 97.3 96.6 97.4  PLT 138* 165 180 194   Cardiac Enzymes:   No results for input(s): CKTOTAL, CKMB, CKMBINDEX, TROPONINI in the last 168 hours. BNP (last 3 results) Recent Labs    06/20/18 1336  BNP 89.8    ProBNP (last 3 results) No results for input(s): PROBNP in the last 8760 hours.  CBG: Recent Labs  Lab 01/11/19 0757 01/11/19 1144 01/11/19 1713 01/11/19 2151 01/12/19 0840  GLUCAP 98 122* 116* 100* 106*    Recent Results (from the past 240 hour(s))  Blood Culture (routine x 2)     Status: None   Collection Time: 01/03/19 12:05 AM   Specimen: BLOOD LEFT HAND  Result Value Ref Range Status   Specimen Description   Final    BLOOD LEFT HAND Performed at [facility], [address] [place], [place] [address]    Special Requests   Final    BOTTLES DRAWN AEROBIC AND ANAEROBIC Blood Culture adequate volume Performed at [facility], [address] [place], [place] [address]    Culture   Final    NO GROWTH 5 DAYS Performed at [facility], [address] [place], [place] [address]    Report Status 01/08/2019 FINAL  Final  Urine culture     Status: None   Collection Time: 01/03/19 12:05 AM   Specimen: In/Out Cath Urine  Result Value Ref Range Status   Specimen Description   Final    IN/OUT CATH URINE Performed at [facility], [address] [place], [place] [address]    Special Requests   Final    NONE Performed at [facility], [address]  [place], [place] [address]    Culture   Final    NO GROWTH Performed at [facility], [address] [place], [place] [address]    Report Status 01/04/2019 FINAL  Final  SARS Coronavirus 2 (CEPHEID - Performed in [facility]), Hosp Order     Status: None   Collection Time: 01/03/19 12:06 AM   Specimen: Nasopharyngeal Swab  Result Value Ref Range Status   SARS Coronavirus 2 NEGATIVE NEGATIVE Final    Comment: (NOTE) If result is NEGATIVE SARS-CoV-2 target nucleic acids are NOT DETECTED. The SARS-CoV-2 RNA is generally detectable in upper and lower  respiratory specimens during the acute phase of  infection. The lowest  concentration of SARS-CoV-2 viral copies this assay can detect is 250  copies / mL. A negative result does not preclude SARS-CoV-2 infection  and should not be used as the sole basis for treatment or other  patient management decisions.  A negative result may occur with  improper specimen collection / handling, submission of specimen other  than nasopharyngeal swab, presence of viral mutation(s) within the  areas targeted by this assay, and inadequate number of viral copies  (<250 copies / mL). A negative result must be combined with clinical  observations, patient history, and epidemiological information. If result is POSITIVE SARS-CoV-2 target nucleic acids are DETECTED. The SARS-CoV-2 RNA is generally detectable in upper and lower  respiratory specimens dur ing the acute phase of infection.  Positive  results are indicative of active infection with SARS-CoV-2.  Clinical  correlation with patient history and other diagnostic information is  necessary to determine patient infection status.  Positive results do  not rule out bacterial infection or co-infection with other viruses. If result is PRESUMPTIVE POSTIVE SARS-CoV-2 nucleic acids MAY BE PRESENT.   A presumptive positive result was obtained on the submitted specimen  and confirmed on repeat  testing.  While 2019 novel coronavirus  (SARS-CoV-2) nucleic acids may be present in the submitted sample  additional confirmatory testing may be necessary for epidemiological  and / or clinical management purposes  to differentiate between  SARS-CoV-2 and other Sarbecovirus currently known to infect humans.  If clinically indicated additional testing with an alternate test  methodology [phone]) is advised. The SARS-CoV-2 RNA is generally  detectable in upper and lower respiratory sp ecimens during the acute  phase of infection. The expected result is Negative. Fact Sheet for Patients:  StrictlyIdeas.no Fact Sheet for Healthcare Providers: BankingDealers.co.za This test is not yet approved or cleared by the [place] FDA and has been authorized for detection and/or diagnosis of SARS-CoV-2 by FDA under an Emergency Use Authorization (EUA).  This EUA will remain in effect (meaning this test can be used) for the duration of the COVID-19 declaration under Section 564(b)(1) of the Act, 21 U.S.C. section 360bbb-3(b)(1), unless the authorization is terminated or revoked sooner. Performed at [facility], [place] [address], [place], [place] [address]   Blood Culture (routine x 2)     Status: None   Collection Time: 01/03/19 12:58 AM   Specimen: BLOOD RIGHT FOREARM  Result Value Ref Range Status   Specimen Description   Final    BLOOD RIGHT FOREARM Performed at [facility], [place] [address] [place], [place] [address]    Special Requests   Final    BOTTLES DRAWN AEROBIC AND ANAEROBIC Blood Culture adequate volume Performed at [place] [address], [place], [place] [address]    Culture   Final    NO GROWTH 5 DAYS Performed at [facility], [place] [address], [place], [place] [address]    Report Status 01/08/2019 FINAL  Final  MRSA PCR Screening     Status: None   Collection Time:  01/03/19  3:33 AM   Specimen: Nasopharyngeal  Result Value Ref Range Status   MRSA by PCR NEGATIVE NEGATIVE Final    Comment:        The GeneXpert MRSA Assay (FDA approved for NASAL specimens only), is one component of a comprehensive MRSA colonization surveillance program. It is not intended to diagnose MRSA infection nor to guide or monitor treatment for  MRSA infections. Performed at [facility], [address] [place], [place] [address]      Studies: No results found.  Scheduled Meds: . enoxaparin (LOVENOX) injection  40 mg Subcutaneous Q24H  . feeding supplement (ENSURE ENLIVE)  237 mL Oral Q24H  . furosemide  40 mg Oral QODAY  . insulin aspart  0-15 Units Subcutaneous TID WC  . insulin aspart  0-5 Units Subcutaneous QHS  . ketoconazole   Topical BID  . lisinopril  10 mg Oral Daily  . mouth rinse  15 mL Mouth Rinse BID  . multivitamin with minerals  1 tablet Oral Daily  . potassium chloride SA  20 mEq Oral Daily  . potassium chloride  40 mEq Oral Once  . simvastatin  40 mg Oral Daily    Continuous Infusions:   Time spent: 15mins I have personally reviewed and interpreted on  01/12/2019 daily labs,  imagings as discussed above under date review session and assessment and plans.  I reviewed all nursing notes, pharmacy notes,  vitals, pertinent old records  I have discussed plan of care as described above with RN , patient  on 01/12/2019   Albertine GratesFang Cerys Winget MD, PhD  [facility] Pager [phone]. If 7PM-7AM, please contact night-coverage at [email or web address], password [email or web address] 01/12/2019, 11:05 AM  LOS: 9 days

## 2019-01-12 NOTE — Progress Notes (Signed)
Occupational Therapy Treatment Patient Details Name: Penny Carr MRN: [id number] DOB: Jan 09, 1946 Today's Date: 01/12/2019    History of present illness Penny Carr  is a 73 y.o. female, w dementia, apparently has witnessed fall out of wheel chair, laceration on the upper lip and presents to ER for evaluation. She was found to have bradycardia and hypothermia.   OT comments  Pt slowly progressing toward stated goals, tearful this date. Attempted supine to sit at total A, pt continuing to be tearful and laying back down. Noticed pt incontinent of urine, total A +2 for clean up. Set pt up in bed in chair position to eat meal. Pt does best with food cut up in small finger food sized bites to facilitate ind. She needs mod-max VC's to continue eating meal. She did automatically pick up apple sauce and spoon and begin eating. Recommend pt continues to be monitored during feeding to help initiate tasks and automatic responses. Will continue to follow per POC listed below. Recommend possible long term care for this pt, if ALF cannot provide 24/7 A.    Follow Up Recommendations  Home health OT;Supervision/Assistance - 24 hour;SNF;Other (comment)(pending how much care ALF willing to provide)    Equipment Recommendations  None recommended by OT    Recommendations for Other Services      Precautions / Restrictions Precautions Precautions: Fall Restrictions Weight Bearing Restrictions: No       Mobility Bed Mobility Overal bed mobility: Needs Assistance Bed Mobility: Supine to Sit     Supine to sit: Total assist     General bed mobility comments: initiated sup> sit, pt crying and laying back down  Transfers                 General transfer comment: deferred    Balance                                           ADL either performed or assessed with clinical judgement   ADL Overall ADL's : Needs assistance/impaired Eating/Feeding: Moderate  assistance;Sitting;Bed level Eating/Feeding Details (indicate cue type and reason): needing mod A for task initiation, cutting up food, and cues to cont engaging in meal                         Toileting- Clothing Manipulation and Hygiene: Total assistance;+2 for physical assistance;Bed level Toileting - Clothing Manipulation Details (indicate cue type and reason): to roll and clean incontinence of urine       General ADL Comments: focused session on feeding, considering pt was originally self feeder     Vision   Vision Assessment?: No apparent visual deficits   Perception     Praxis      Cognition Arousal/Alertness: Awake/alert Behavior During Therapy: Flat affect Overall Cognitive Status: History of cognitive impairments - at baseline Area of Impairment: Orientation;Memory;Attention;Following commands;Problem solving                 Orientation Level: Person Current Attention Level: Sustained Memory: Decreased short-term memory Following Commands: Follows one step commands inconsistently;Follows one step commands with increased time     Problem Solving: Difficulty sequencing;Requires verbal cues;Slow processing;Decreased initiation;Requires tactile cues General Comments: hx of dementia        Exercises     Shoulder Instructions       General Comments  Pertinent Vitals/ Pain       Pain Assessment: Faces Faces Pain Scale: No hurt  Home Living                                          Prior Functioning/Environment              Frequency  Min 2X/week        Progress Toward Goals  OT Goals(current goals can now be found in the care plan section)  Progress towards OT goals: Progressing toward goals  Acute Rehab OT Goals Time For Goal Achievement: 01/21/19 Potential to Achieve Goals: Good  Plan Discharge plan remains appropriate;Frequency remains appropriate    Co-evaluation                 AM-PAC OT  "6 Clicks" Daily Activity     Outcome Measure                    End of Session    OT Visit Diagnosis: Other abnormalities of gait and mobility (R26.89);Muscle weakness (generalized) (M62.81);History of falling (Z91.81);Other symptoms and signs involving cognitive function   Activity Tolerance Patient tolerated treatment well   Patient Left with call bell/phone within reach;in bed;with bed alarm set   Nurse Communication Mobility status        Time: 3300-7622 OT Time Calculation (min): 37 min  Charges: OT General Charges $OT Visit: 1 Visit OT Treatments $Self Care/Home Management : 23-37 mins  Zenovia Jarred, MSOT, OTR/L [place] Office: [phone]  Zenovia Jarred 01/12/2019, 1:21 PM

## 2019-01-12 NOTE — Care Management Important Message (Signed)
Important Message  Patient Details IM Letter given to [name] RN to present to the Patient Name: Penny Carr MRN: [id number] Date of Birth: 08-08-1945   Medicare Important Message Given:  Yes     Kerin Salen 01/12/2019, 11:00 AM

## 2019-01-12 NOTE — Progress Notes (Signed)
Pt's son has accepted bed at [facility]. Spoke with Gerald Stabs at [facility] who states they can take pt on Friday provided that the COVID test is negative. Checked with [facility] lab pt's COVID test was sent out the 7/18 and yesterday 7/22 neither one will be read this week, outside lab is behind. Pt would benefit with rapid COVID test here at [facility] related to bed availability at [facility].

## 2019-01-12 NOTE — Progress Notes (Signed)
Patient HR dropped down to 29 and had 2.5-2.7 paused. Pt asleep at this time. N.P. informed. Will continue to montior

## 2019-01-13 LAB — NOVEL CORONAVIRUS, NAA (HOSP ORDER, SEND-OUT TO REF LAB; TAT 18-24 HRS): SARS-CoV-2, NAA: NOT DETECTED

## 2019-01-13 LAB — BASIC METABOLIC PANEL
Anion gap: 12 (ref 5–15)
BUN: 18 mg/dL (ref 8–23)
CO2: 27 mmol/L (ref 22–32)
Calcium: 10.1 mg/dL (ref 8.9–10.3)
Chloride: 103 mmol/L (ref 98–111)
Creatinine, Ser: 0.76 mg/dL (ref 0.44–1.00)
GFR calc Af Amer: >60 mL/min (ref >60)
GFR calc non Af Amer: >60 mL/min (ref >60)
Glucose, Bld: 115 mg/dL — ABNORMAL HIGH (ref 70–99)
Potassium: 3.8 mmol/L (ref 3.5–5.1)
Sodium: 142 mmol/L (ref 135–145)

## 2019-01-13 LAB — MAGNESIUM: Magnesium: 2.3 mg/dL (ref 1.7–2.4)

## 2019-01-13 LAB — GLUCOSE, CAPILLARY
Glucose-Capillary: 98 mg/dL (ref 70–99)
Glucose-Capillary: 152 mg/dL — ABNORMAL HIGH (ref 70–99)

## 2019-01-13 MED ORDER — DIAZEPAM 5 MG PO TABS: 5.0000 mg | ORAL_TABLET | Freq: Two times a day (BID) | ORAL | 0 refills | Status: AC | PRN

## 2019-01-13 MED ORDER — LISINOPRIL 10 MG PO TABS: 10.0000 mg | ORAL_TABLET | Freq: Every day | ORAL | 0 refills | Status: AC

## 2019-01-13 MED ORDER — HALOPERIDOL 2 MG PO TABS: 2.0000 mg | ORAL_TABLET | Freq: Three times a day (TID) | ORAL | 0 refills | Status: AC | PRN

## 2019-01-13 MED ORDER — SENNOSIDES-DOCUSATE SODIUM 8.6-50 MG PO TABS: 1.0000 | ORAL_TABLET | Freq: Every day | ORAL | 0 refills | Status: AC

## 2019-01-13 MED ORDER — ADULT MULTIVITAMIN W/MINERALS CH: 1.0000 | ORAL_TABLET | Freq: Every day | ORAL | 0 refills | Status: AC

## 2019-01-13 MED ORDER — VITAMIN B-1 100 MG PO TABS: 100.0000 mg | ORAL_TABLET | Freq: Every day | ORAL | 0 refills | Status: AC

## 2019-01-13 NOTE — Progress Notes (Signed)
D/c'd to Penny Carr center via [facility] : NAD noted VSS, report called to [facility]  spoke to [employer] LPN

## 2019-01-13 NOTE — TOC Progression Note (Signed)
Transition of Care [facility]) - Progression Note    Patient Details  Name: Penny Carr MRN: [id number] Date of Birth: 07-24-1945  Transition of Care [facility] CM/SW Contact  Joaquin Courts, RN Phone Number: 01/13/2019, 11:40 AM  Clinical Narrative:    Patient to discharge to [facility] today. CM spoke with patient's son and updated regarding d/c plans. Bed availability confirmed with facility. [facility] transportation arranged.     Expected Discharge Plan: [place] Barriers to Discharge: No Barriers Identified  Expected Discharge Plan and Services Expected Discharge Plan: [place] In-house Referral: Clinical Social Work     Living arrangements for the past 2 months: (Memory Care) Expected Discharge Date: 01/13/19                                     Social Determinants of Health (SDOH) Interventions    Readmission Risk Interventions No flowsheet data found.

## 2019-01-13 NOTE — Discharge Summary (Signed)
Discharge Summary  Penny Carr ZOX:[id number] DOB: [birth year]  PCP: Samuel JesterButler, Cynthia, DO (Inactive)  Admit date: 01/02/2019 Discharge date: 01/13/2019  Time spent: 45mins, more than 50% time spent on coordination of care.  Recommendations for Outpatient Follow-up:  1. F/u with SNF MD  for hospital discharge follow up, repeat cbc/bmp at follow up 2. Recommend SNF to arrange palliative care consult   Discharge Diagnoses:  Active Hospital Problems   Diagnosis Date Noted   Hypothermia 01/03/2019   Fall    Diet-controlled diabetes mellitus (HCC)    Acute lower UTI 01/03/2019   Bradycardia 01/03/2019   Diabetes (HCC) 01/03/2019   Dementia (HCC) 01/03/2019    Resolved Hospital Problems  No resolved problems to display.    Discharge Condition: stable  Diet recommendation: heart healthy/carb modified  Filed Weights   01/11/19 0336 01/12/19 0500 01/12/19 2113  Weight: 108 kg 108.6 kg 105 kg    History of present illness: (Penny admitting MD Dr Selena BattenKim) Penny Carr  is a 73 y.o. female, w dementia, apparently has witnessed fall out of wheel chair, laceration on the upper lip and presents to ER for evaluation.    In ED,   T 92  P 50  R 18 Bp 149/69  Pox 99% on RA  CT brain , C spine IMPRESSION: 1. No acute intracranial abnormality or calvarial fracture. 2. Stable chronic microvascular ischemic changes and volume loss of the brain. 3. No acute fracture or dislocation of cervical spine. 4. Stable cervical spondylosis greatest at the C5 through C7 levels. Moderate to severe C5-6 spinal canal stenosis.  CXR IMPRESSION: No acute cardiopulmonary abnormality or acute traumatic injury Identified.  Xray hip IMPRESSION: No acute fracture or dislocation identified about the bilateral hips or pelvis.  Na 140, K 4.3, Bun 20, Creatinine 0.62 Calcium 10.7  Alb 3.6 Ast 20, Alt 21 Wbc 6.4, hgb 13.5, Plt 172  INR 0.9  Urinalysis  Wbc 11-20  Rbc 0-5  Covid  negative  Pt will be admitted for UTI, hypothermia, and bradycardia  Hospital Course:  Principal Problem:   Hypothermia Active Problems:   Acute lower UTI   Bradycardia   Diabetes (HCC)   Dementia (HCC)   Fall   Diet-controlled diabetes mellitus (HCC)   witnessed fall out of wheel chair, laceration on the upper lip (presenting complaints) -she was sent from ALF to the hospital due to above complaints -CT brain and c spine no acute findings -Hip xray no acute findings -She is from assisted living, she needs higher level of care, awaiting for SNF bed -Avoid polypharmacy, namenda, lexapro, depakote held since admission  Hypothermia, possible UTI, she does not appear to has sepsis on presentation -ua on presentation Wbc 11-20 Rbc 0-5 with rare bacteremia, patient is not able to provide history -Blood and urine culture no growth, cxr no acute findings -Vanc/cefepime/flagyl on presentation x1 -Rocephin x5 days -Hypothermia has resolved  Bradycardia (chronic), asymptomatic -tsh 3.3, keep Mag>2, k>4 -Echo with preserved lvef -Namenda, Lexapro, Depakote held since admission -Heart rate is improving  Chronic bilateral lower extremity edema/venous stasis, possible underline chronic diastolic chf (echo with Left ventricular diastolic Doppler parameters are consistent with impaired relaxation. Elevated mean left atrial pressure ) -una boots -Continue home everyotherday lasix -improving  Diet controlled DM2 a1c 5.8 Blood glucose stable  Hyperlipidemia -Cont Simvastatin 40mg  po qhs as tolerated -Presentlystable  Dementia/FTT Ct head with "Stable chronic microvascular ischemic changes and volume loss of the brain" Namenda XR 28mg  po qday held  Penny above Lexapro 10mg  po qday held Penny above Valium only PRN Penny above SNF placement, recommend SNF to arrange palliative care  Code Status: full  Family Communication: patient   Disposition Plan: SNF with palliative care  on 7/24, repeat COVID screening is negative on 7/23   Consultants:  none  Procedures:  none  Antibiotics:  Vanc/cefepime/flagyl on presentation x1  Rocephin x5 days   Discharge Exam: BP (!) 122/55 (BP Location: Right Arm)    Pulse (!) 44    Temp 97.8 F (36.6 C) (Axillary)    Resp 17    Ht 5\' 6"  (1.676 m)    Wt 105 kg    SpO2 95%    BMI 37.36 kg/m     General:  NAD, demented, only oriented to self, does not answer questions appropriately, though calm , no agitation   Cardiovascular: RRR  Respiratory: CTABL  Abdomen: Soft/ND/NT, positive BS  Musculoskeletal: bilateral unna boot, right ankle wound appear healing, dry, nontender, right foot appear more puffy then left foot  Neuro: alert, oriented to self only    Discharge Instructions You were cared for by a hospitalist during your hospital stay. If you have any questions about your discharge medications or the care you received while you were in the hospital after you are discharged, you can call the unit and asked to speak with the hospitalist on call if the hospitalist that took care of you is not available. Once you are discharged, your primary care physician will handle any further medical issues. Please note that NO REFILLS for any discharge medications will be authorized once you are discharged, as it is imperative that you return to your primary care physician (or establish a relationship with a primary care physician if you do not have one) for your aftercare needs so that they can reassess your need for medications and monitor your lab values.  Discharge Instructions    Diet - low sodium heart healthy   Complete by: As directed    Increase activity slowly   Complete by: As directed      Allergies as of 01/13/2019   No Known Allergies     Medication List    STOP taking these medications   divalproex 125 MG capsule Commonly known as: DEPAKOTE SPRINKLE   ergocalciferol 1.25 MG (50000 UT)  capsule Commonly known as: VITAMIN D2   escitalopram 10 MG tablet Commonly known as: LEXAPRO   guaifenesin 100 MG/5ML syrup Commonly known as: ROBITUSSIN   HYDROcodone-acetaminophen 5-325 MG tablet Commonly known as: NORCO/VICODIN   loperamide 2 MG capsule Commonly known as: IMODIUM   LORazepam 0.5 MG tablet Commonly known as: ATIVAN   magnesium hydroxide 400 MG/5ML suspension Commonly known as: MILK OF MAGNESIA   memantine 28 MG Cp24 24 hr capsule Commonly known as: NAMENDA XR   Mintox 200-200-20 MG/5ML suspension Generic drug: alum & mag hydroxide-simeth   neomycin-bacitracin-polymyxin ointment Commonly known as: NEOSPORIN   traZODone 50 MG tablet Commonly known as: DESYREL     TAKE these medications   acetaminophen 500 MG tablet Commonly known as: TYLENOL Take 500 mg by mouth every 4 (four) hours as needed for mild pain.   Calmoseptine 0.44-20.6 % Oint Generic drug: Menthol-Zinc Oxide Apply 1 application topically 2 (two) times daily as needed (rash/irritation).   Cranberry 500 MG Caps Take 500 mg by mouth 2 (two) times daily.   diazepam 5 MG tablet Commonly known as: VALIUM Take 1 tablet (5 mg total) by mouth every  12 (twelve) hours as needed for anxiety. What changed:   when to take this  reasons to take this   furosemide 40 MG tablet Commonly known as: LASIX Take 40 mg by mouth every other day.   haloperidol 2 MG tablet Commonly known as: HALDOL Take 1 tablet (2 mg total) by mouth every 8 (eight) hours as needed for agitation. What changed:   when to take this  reasons to take this   lisinopril 10 MG tablet Commonly known as: ZESTRIL Take 1 tablet (10 mg total) by mouth daily. Start taking on: January 14, 2019   multivitamin with minerals Tabs tablet Take 1 tablet by mouth daily. Start taking on: January 14, 2019   nystatin powder Generic drug: nystatin Apply 1 g topically 2 (two) times daily as needed (candidiasis rash).   potassium  chloride SA 20 MEQ tablet Commonly known as: K-DUR Take 20 mEq by mouth daily.   senna-docusate 8.6-50 MG tablet Commonly known as: Senokot-S Take 1 tablet by mouth at bedtime.   simvastatin 40 MG tablet Commonly known as: ZOCOR Take 40 mg by mouth daily.   thiamine 100 MG tablet Commonly known as: VITAMIN B-1 Take 1 tablet (100 mg total) by mouth daily.      No Known Allergies Follow-up Information    Samuel Jester, DO Follow up.   Contact information: [address] [place] [place] [address] [phone]        [name], MD .   Specialty: Cardiology Contact information: [address] [place] [address] [place] [address] [phone]            The results of significant diagnostics from this hospitalization (including imaging, microbiology, ancillary and laboratory) are listed below for reference.    Significant Diagnostic Studies: Dg Chest 1 View  Result Date: 01/03/2019 CLINICAL DATA:  73 year old female status post witnessed fall. EXAM: CHEST  1 VIEW COMPARISON:  Chest radiographs 06/21/2018 and earlier. FINDINGS: Supine AP view at 0014 hours. Mildly lower lung volumes. Stable cardiomegaly and mediastinal contours. Visualized tracheal air column is within normal limits. Allowing for portable technique the lungs are clear. Paucity of bowel gas in the upper abdomen. No acute osseous abnormality identified. IMPRESSION: No acute cardiopulmonary abnormality or acute traumatic injury identified. Electronically Signed   By: Odessa Fleming M.D.   On: 01/03/2019 00:36   Ct Head Wo Contrast  Result Date: 01/03/2019 CLINICAL DATA:  73 y/o F; fall from wheelchair, laceration to upper lip. History of Alzheimer's and diabetes. EXAM: CT HEAD WITHOUT CONTRAST CT CERVICAL SPINE WITHOUT CONTRAST TECHNIQUE: Multidetector CT imaging of the head and cervical spine was performed following the standard protocol without intravenous contrast. Multiplanar CT image reconstructions  of the cervical spine were also generated. COMPARISON:  06/20/2018 CT head. 05/25/2018 CT cervical spine. FINDINGS: CT HEAD FINDINGS Brain: No evidence of acute infarction, hemorrhage, hydrocephalus, extra-axial collection or mass lesion/mass effect. Stable chronic microvascular ischemic changes and volume loss of the brain. Vascular: No hyperdense vessel or unexpected calcification. Skull: Normal. Negative for fracture or focal lesion. Sinuses/Orbits: No acute finding. Other: None. CT CERVICAL SPINE FINDINGS Alignment: Straightening of cervical lordosis. Skull base and vertebrae: No acute fracture. No primary bone lesion or focal pathologic process. Soft tissues and spinal canal: No prevertebral fluid or swelling. No visible canal hematoma. Disc levels: Stable cervical spondylosis with predominant discogenic degenerative changes greatest at the C5 through C7 levels. Moderate to severe C5-6 spinal canal stenosis. Uncovertebral hypertrophy encroach on bilateral C5-C7 neural foramen.  Upper chest: Negative. Other: Aortic and carotid bifurcation calcific atherosclerosis. IMPRESSION: 1. No acute intracranial abnormality or calvarial fracture. 2. Stable chronic microvascular ischemic changes and volume loss of the brain. 3. No acute fracture or dislocation of cervical spine. 4. Stable cervical spondylosis greatest at the C5 through C7 levels. Moderate to severe C5-6 spinal canal stenosis. Electronically Signed   By: Kristine Garbe M.D.   On: 01/03/2019 01:55   Ct Cervical Spine Wo Contrast  Result Date: 01/03/2019 CLINICAL DATA:  73 y/o F; fall from wheelchair, laceration to upper lip. History of Alzheimer's and diabetes. EXAM: CT HEAD WITHOUT CONTRAST CT CERVICAL SPINE WITHOUT CONTRAST TECHNIQUE: Multidetector CT imaging of the head and cervical spine was performed following the standard protocol without intravenous contrast. Multiplanar CT image reconstructions of the cervical spine were also generated.  COMPARISON:  06/20/2018 CT head. 05/25/2018 CT cervical spine. FINDINGS: CT HEAD FINDINGS Brain: No evidence of acute infarction, hemorrhage, hydrocephalus, extra-axial collection or mass lesion/mass effect. Stable chronic microvascular ischemic changes and volume loss of the brain. Vascular: No hyperdense vessel or unexpected calcification. Skull: Normal. Negative for fracture or focal lesion. Sinuses/Orbits: No acute finding. Other: None. CT CERVICAL SPINE FINDINGS Alignment: Straightening of cervical lordosis. Skull base and vertebrae: No acute fracture. No primary bone lesion or focal pathologic process. Soft tissues and spinal canal: No prevertebral fluid or swelling. No visible canal hematoma. Disc levels: Stable cervical spondylosis with predominant discogenic degenerative changes greatest at the C5 through C7 levels. Moderate to severe C5-6 spinal canal stenosis. Uncovertebral hypertrophy encroach on bilateral C5-C7 neural foramen. Upper chest: Negative. Other: Aortic and carotid bifurcation calcific atherosclerosis. IMPRESSION: 1. No acute intracranial abnormality or calvarial fracture. 2. Stable chronic microvascular ischemic changes and volume loss of the brain. 3. No acute fracture or dislocation of cervical spine. 4. Stable cervical spondylosis greatest at the C5 through C7 levels. Moderate to severe C5-6 spinal canal stenosis. Electronically Signed   By: Kristine Garbe M.D.   On: 01/03/2019 01:55   Dg Hips Bilat W Or Wo Pelvis 5 Views  Result Date: 01/03/2019 CLINICAL DATA:  73 year old female status post witnessed fall. EXAM: DG HIP (WITH OR WITHOUT PELVIS) 5+V BILAT COMPARISON:  CT Abdomen and Pelvis 06/20/2018. FINDINGS: Femoral heads are normally located. Hip joint spaces appear stable and symmetric. Osteopenia. No pelvic fracture identified. SI joints appear stable and within normal limits. Negative visible abdominal and pelvic visceral contours, chronic pelvic phleboliths. Proximal  femur bone detail is suboptimal due to body habitus. No proximal femur fracture identified. IMPRESSION: No acute fracture or dislocation identified about the bilateral hips or pelvis. Electronically Signed   By: Genevie Ann M.D.   On: 01/03/2019 00:39    Microbiology: Recent Results (from the past 240 hour(s))  Novel Coronavirus, NAA (hospital order; send-out to ref lab)     Status: None   Collection Time: 01/07/19  4:00 PM   Specimen: Nasopharyngeal Swab; Respiratory  Result Value Ref Range Status   SARS-CoV-2, NAA NOT DETECTED NOT DETECTED Final    Comment: (NOTE) This test was developed and its performance characteristics determined by [employer]. This test has not been FDA cleared or approved. This test has been authorized by FDA under an Emergency Use Authorization (EUA). This test is only authorized for the duration of time the declaration that circumstances exist justifying the authorization of the emergency use of in vitro diagnostic tests for detection of SARS-CoV-2 virus and/or diagnosis of COVID-19 infection under section 564(b)(1) of the Act,  21 U.S.C. 360bbb-3(b)(1), unless the authorization is terminated or revoked sooner. When diagnostic testing is negative, the possibility of a false negative result should be considered in the context of a patient's recent exposures and the presence of clinical signs and symptoms consistent with COVID-19. An individual without symptoms of COVID-19 and who is not shedding SARS-CoV-2 virus would expect to have a negative (not detected) result in this assay. Performed  At: [facility] [place] [address] [place], [place] [id number] Jolene SchimkeNagendra Sanjai MD WU:[id number]:[phone]    Coronavirus Source NASOPHARYNGEAL  Final    Comment: Performed at [facility], [address] [place], [place] [address]  Novel Coronavirus, NAA (hospital order; send-out to ref lab)     Status: None   Collection Time: 01/11/19  4:32 PM    Specimen: Nasopharyngeal Swab; Respiratory  Result Value Ref Range Status   SARS-CoV-2, NAA NOT DETECTED NOT DETECTED Final    Comment: (NOTE) This test was developed and its performance characteristics determined by [facility]. This test has not been FDA cleared or approved. This test has been authorized by FDA under an Emergency Use Authorization (EUA). This test is only authorized for the duration of time the declaration that circumstances exist justifying the authorization of the emergency use of in vitro diagnostic tests for detection of SARS-CoV-2 virus and/or diagnosis of COVID-19 infection under section 564(b)(1) of the Act, 21 U.S.C. 308MVH-8(I)(6360bbb-3(b)(1), unless the authorization is terminated or revoked sooner. When diagnostic testing is negative, the possibility of a false negative result should be considered in the context of a patient's recent exposures and the presence of clinical signs and symptoms consistent with COVID-19. An individual without symptoms of COVID-19 and who is not shedding SARS-CoV-2 virus would expect to have a negative (not detected) result in this assay. Performed  At: [facility] [place] [address] [place], [place] [id number] Jolene SchimkeNagendra Sanjai MD LK:[id number]:[phone]    Coronavirus Source NASOPHARYNGEAL  Final    Comment: Performed at [facility], [address] [place], [place] [address]  SARS Coronavirus 2 (CEPHEID - Performed in [facility] hospital lab), Hosp Order     Status: None   Collection Time: 01/12/19 12:10 PM   Specimen: Nasopharyngeal Swab  Result Value Ref Range Status   SARS Coronavirus 2 NEGATIVE NEGATIVE Final    Comment: (NOTE) If result is NEGATIVE SARS-CoV-2 target nucleic acids are NOT DETECTED. The SARS-CoV-2 RNA is generally detectable in upper and lower  respiratory specimens during the acute phase of infection. The lowest  concentration of SARS-CoV-2 viral copies this assay can detect is 250  copies  / mL. A negative result does not preclude SARS-CoV-2 infection  and should not be used as the sole basis for treatment or other  patient management decisions.  A negative result may occur with  improper specimen collection / handling, submission of specimen other  than nasopharyngeal swab, presence of viral mutation(s) within the  areas targeted by this assay, and inadequate number of viral copies  (<250 copies / mL). A negative result must be combined with clinical  observations, patient history, and epidemiological information. If result is POSITIVE SARS-CoV-2 target nucleic acids are DETECTED. The SARS-CoV-2 RNA is generally detectable in upper and lower  respiratory specimens dur ing the acute phase of infection.  Positive  results are indicative of active infection with SARS-CoV-2.  Clinical  correlation with patient history and other diagnostic information is  necessary to determine patient infection status.  Positive results do  not rule out bacterial infection or co-infection with other viruses. If result is PRESUMPTIVE POSTIVE SARS-CoV-2 nucleic acids MAY BE PRESENT.   A presumptive positive result was obtained on the submitted specimen  and confirmed on repeat testing.  While 2019 novel coronavirus  (SARS-CoV-2) nucleic acids may be present in the submitted sample  additional confirmatory testing may be necessary for epidemiological  and / or clinical management purposes  to differentiate between  SARS-CoV-2 and other Sarbecovirus currently known to infect humans.  If clinically indicated additional testing with an alternate test  methodology [phone]) is advised. The SARS-CoV-2 RNA is generally  detectable in upper and lower respiratory sp ecimens during the acute  phase of infection. The expected result is Negative. Fact Sheet for Patients:  BoilerBrush.com.cy Fact Sheet for Healthcare Providers: https://pope.com/ This test  is not yet approved or cleared by the [place] FDA and has been authorized for detection and/or diagnosis of SARS-CoV-2 by FDA under an Emergency Use Authorization (EUA).  This EUA will remain in effect (meaning this test can be used) for the duration of the COVID-19 declaration under Section 564(b)(1) of the Act, 21 U.S.C. section 360bbb-3(b)(1), unless the authorization is terminated or revoked sooner. Performed at [facility], [address] [place], [place] [address]      Labs: Basic Metabolic Panel: Recent Labs  Lab 01/07/19 0746 01/08/19 0530 01/09/19 0442 01/10/19 0517 01/11/19 0446 01/13/19 0427  NA 140 141 141  --  140 142  K 3.5 3.6 4.2  --  4.0 3.8  CL 104 105 107  --  107 103  CO2 --  25 27  GLUCOSE 115* 115* 112*  --  108* 115*  BUN --  13 18  CREATININE 0.57 0.53 0.64 0.57 0.58 0.76  CALCIUM 9.7 9.8 9.8  --  9.8 10.1  MG 2.0  --   --   --   --  2.3   Liver Function Tests: Recent Labs  Lab 01/07/19 0746 01/08/19 0530  AST 21 17  ALT 22 19  ALKPHOS 73 69  BILITOT 0.5 0.3  PROT 5.8* 5.7*  ALBUMIN 2.8* 2.8*   No results for input(s): LIPASE, AMYLASE in the last 168 hours. No results for input(s): AMMONIA in the last 168 hours. CBC: Recent Labs  Lab 01/07/19 0746 01/08/19 0530 01/11/19 0446  WBC 6.2 5.9 7.1  NEUTROABS 4.0 3.0  --   HGB 11.9* 11.7* 11.3*  HCT 39.1 40.1 37.8  MCV 97.3 96.6 97.4  PLT 165 180 194   Cardiac Enzymes: No results for input(s): CKTOTAL, CKMB, CKMBINDEX, TROPONINI in the last 168 hours. BNP: BNP (last 3 results) Recent Labs    06/20/18 1336  BNP 89.8    ProBNP (last 3 results) No results for input(s): PROBNP in the last 8760 hours.  CBG: Recent Labs  Lab 01/12/19 0840 01/12/19 1201 01/12/19 1701 01/12/19 2211 01/13/19 0811  GLUCAP 106* 149* 84 104* 98       Signed:  Albertine Grates MD, PhD  [facility] 01/13/2019, 10:39 AM

## 2020-02-03 IMAGING — CT CT HEAD WITHOUT CONTRAST
4 of 7 series · 15 of 47 positions shown, 16 images · non-contrast
Comparison: 06/20/2018 CT head. 05/25/2018 CT cervical spine.

CLINICAL DATA: 73 y/o F; fall from wheelchair, laceration to upper
lip. History of Alzheimer's and diabetes.

EXAM:
CT HEAD WITHOUT CONTRAST
CT CERVICAL SPINE WITHOUT CONTRAST
TECHNIQUE: Multidetector CT imaging of the head and cervical spine was
performed following the standard protocol without intravenous
contrast. Multiplanar CT image reconstructions of the cervical spine
were also generated.

[Series 3: head wo · axial · 0.47mm/px · z∈[+1223,+1293]mm · 3 of 30 slices shown, 4 images]
[im 8/30  brain]
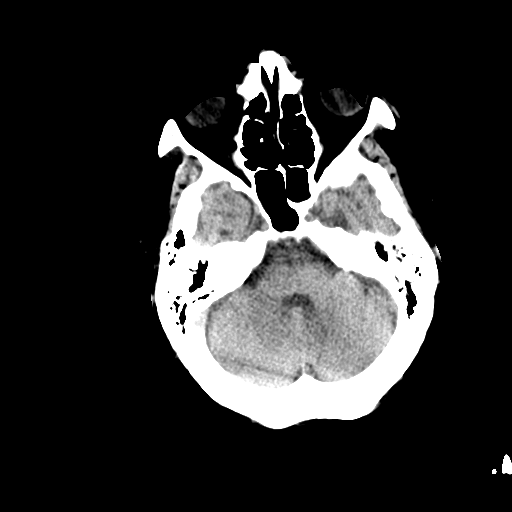
[im 8/30  bone]
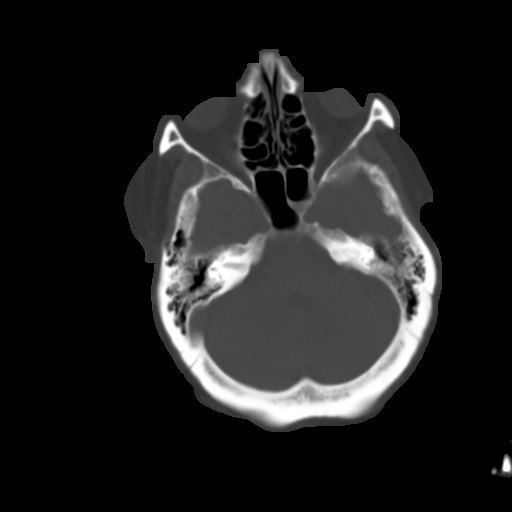
[im 15/30  brain]
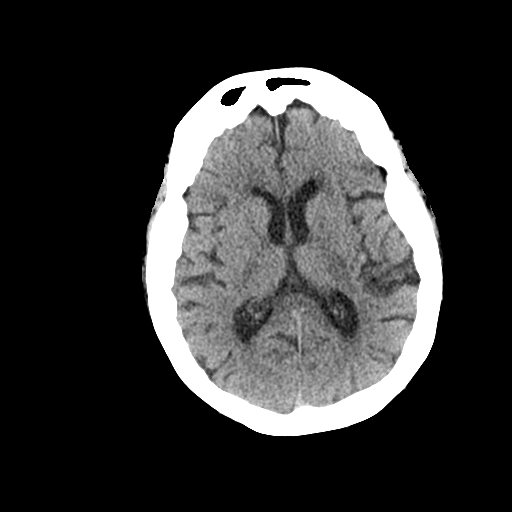
[im 22/30  brain]
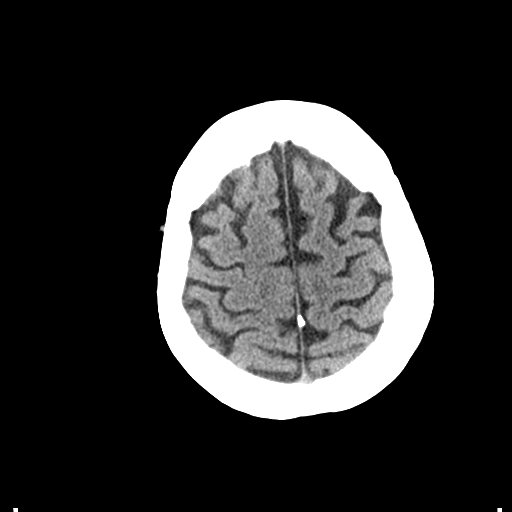

[Series 5: coronal soft tissue · coronal · 0.28mm/px · 3 of 61 slices shown]
[im 18/61  brain]
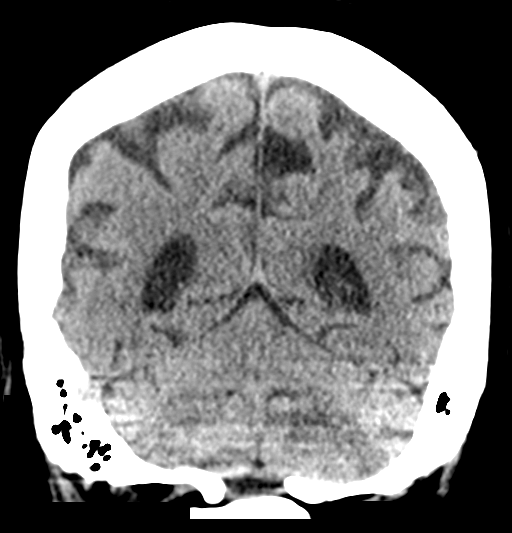
[im 26/61  brain]
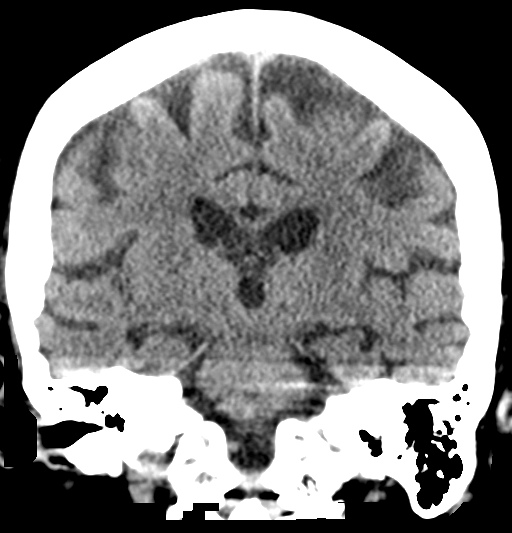
[im 35/61  brain]
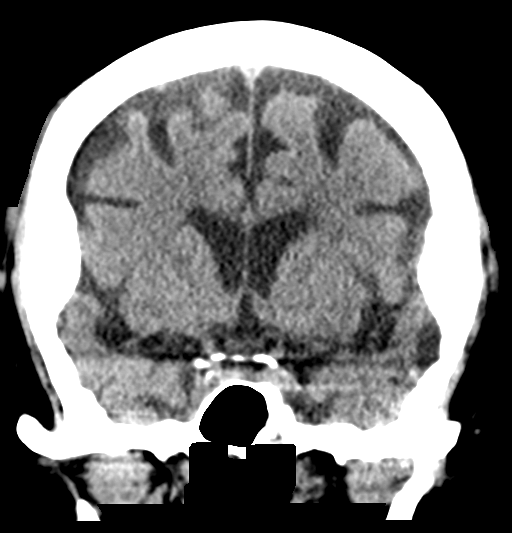

[Series 6: sagittal soft tissue · sagittal · 0.29mm/px · 2 of 48 slices shown]
[im 16/48  brain]
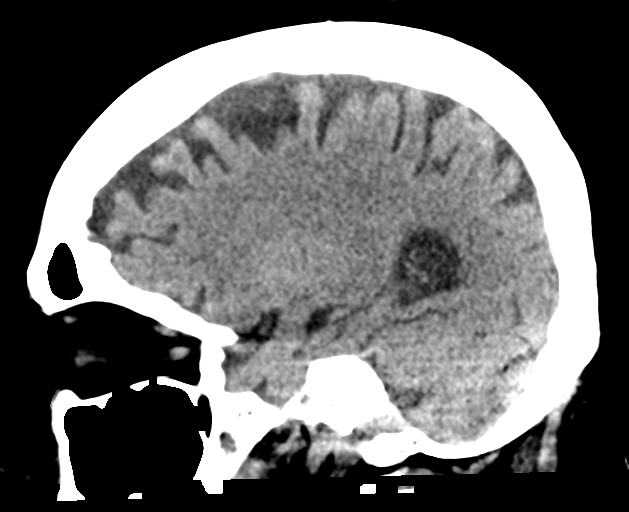
[im 32/48  brain]
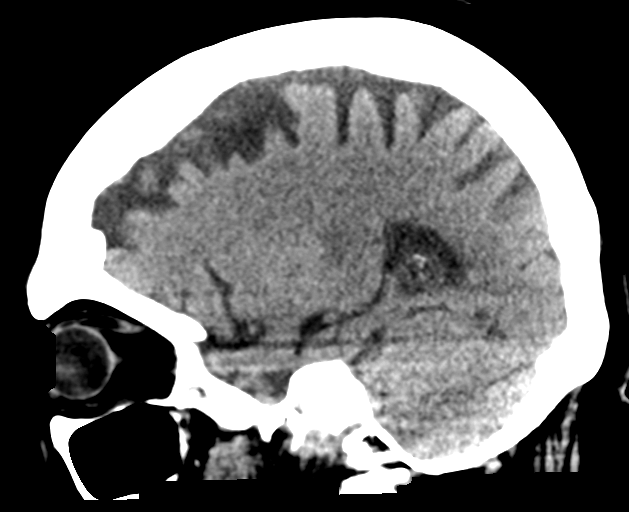

[Series 9: orthogonal bone · axial · 0.18mm/px · z∈[+1027,+1146]mm · 7 of 98 slices shown]
[im 9/98  bone]
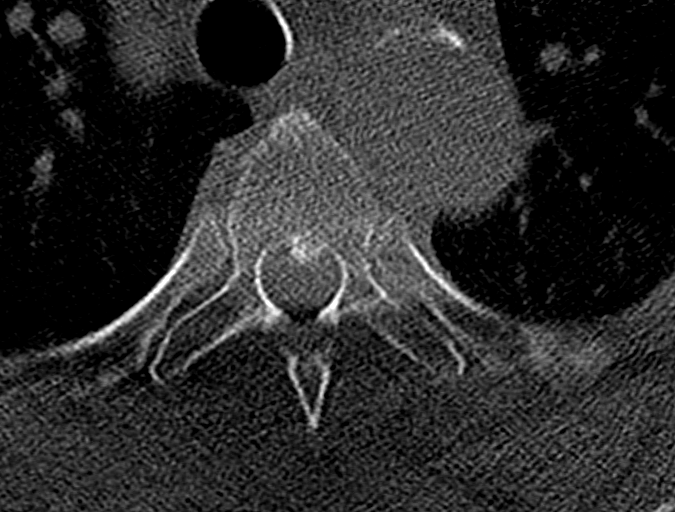
[im 25/98  bone]
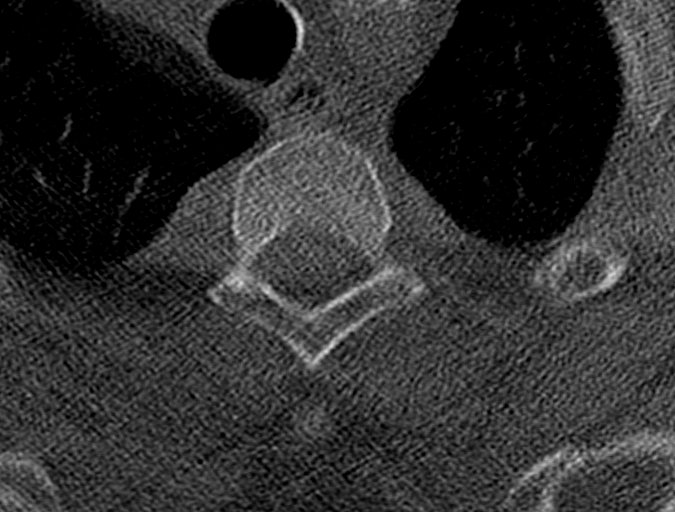
[im 33/98  bone]
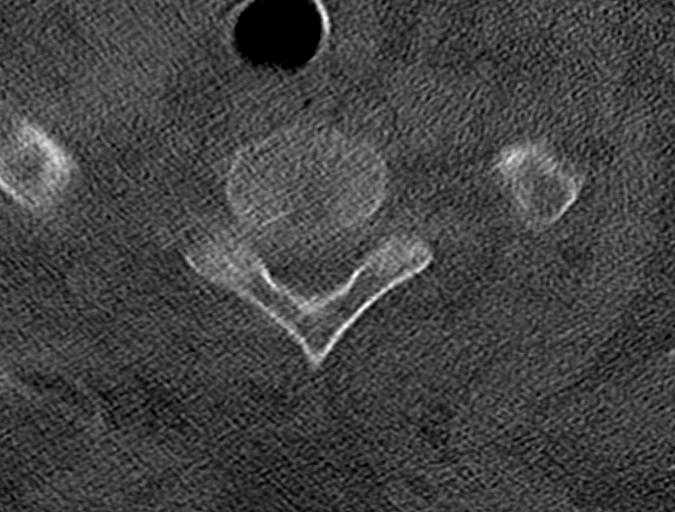
[im 41/98  bone]
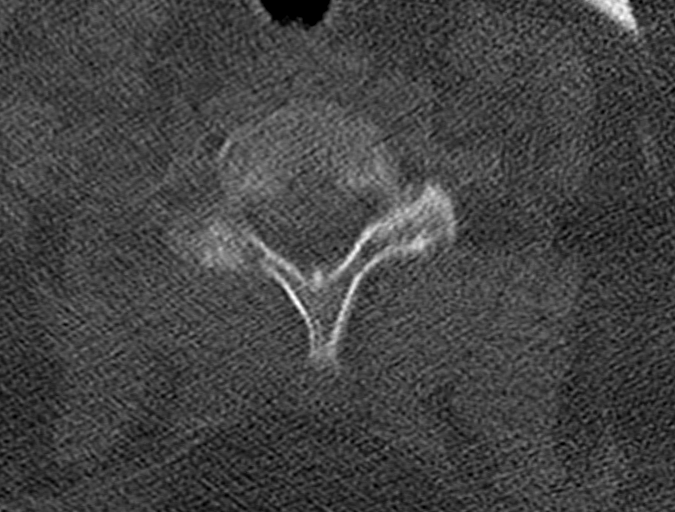
[im 57/98  bone]
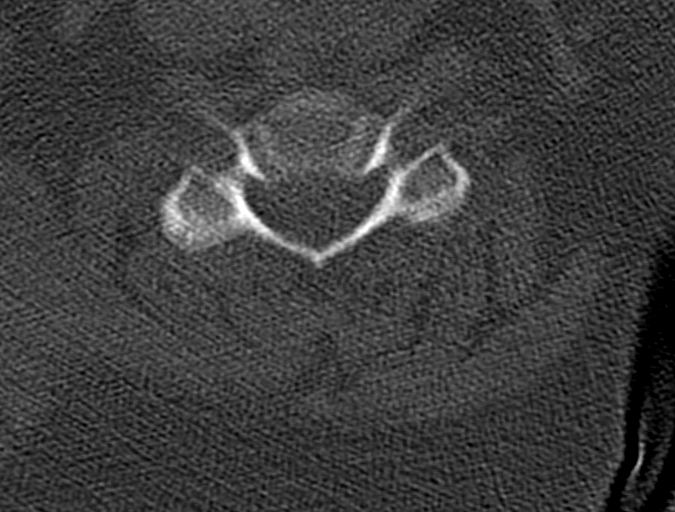
[im 65/98  bone]
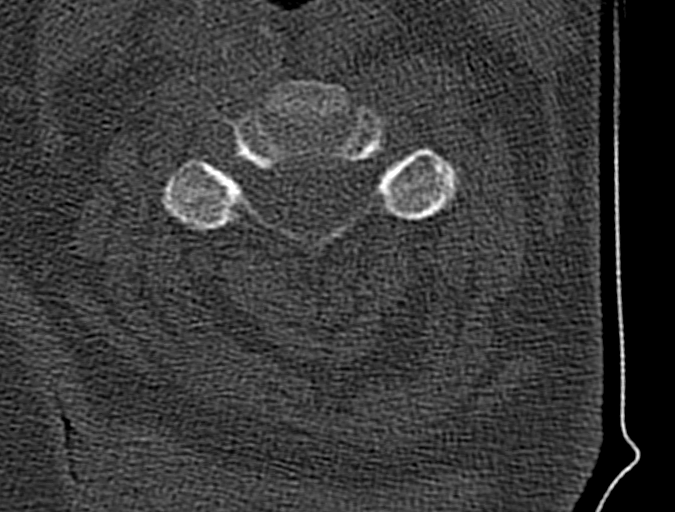
[im 73/98  bone]
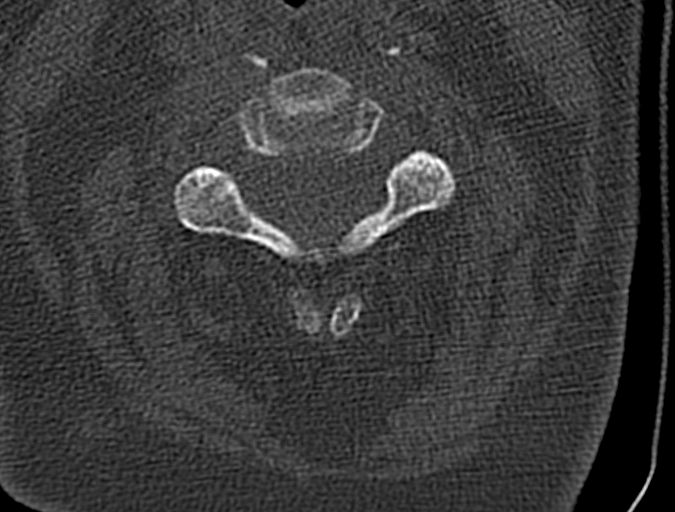

[15 of 47 positions shown; findings below may reference images not displayed]

FINDINGS: CT HEAD FINDINGS

Brain: No evidence of acute infarction, hemorrhage, hydrocephalus,
extra-axial collection or mass lesion/mass effect. Stable chronic
microvascular ischemic changes and volume loss of the brain.

Vascular: No hyperdense vessel or unexpected calcification.

Skull: Normal. Negative for fracture or focal lesion.

Sinuses/Orbits: No acute finding.

Other: None.

CT CERVICAL SPINE FINDINGS

Alignment: Straightening of cervical lordosis.

Skull base and vertebrae: No acute fracture. No primary bone lesion
or focal pathologic process.

Soft tissues and spinal canal: No prevertebral fluid or swelling. No
visible canal hematoma.

Disc levels: Stable cervical spondylosis with predominant discogenic
degenerative changes greatest at the C5 through C7 levels. Moderate
to severe C5-6 spinal canal stenosis. Uncovertebral hypertrophy
encroach on bilateral C5-C7 neural foramen.

Upper chest: Negative.

Other: Aortic and carotid bifurcation calcific atherosclerosis.
IMPRESSION: 1. No acute intracranial abnormality or calvarial fracture.
2. Stable chronic microvascular ischemic changes and volume loss of
the brain.
3. No acute fracture or dislocation of cervical spine.
4. Stable cervical spondylosis greatest at the C5 through C7 levels.
Moderate to severe C5-6 spinal canal stenosis.

## 2020-06-22 DEATH — deceased
